# Patient Record
Sex: Female | Born: 1970 | Race: Black or African American | Hispanic: No | Smoking: Current every day smoker
Health system: Southern US, Community
[De-identification: ages and names within clinical notes are randomized; demographics above are authoritative.]

## PROBLEM LIST (undated history)

## (undated) DIAGNOSIS — F1090 Alcohol use, unspecified, uncomplicated: Secondary | ICD-10-CM

## (undated) DIAGNOSIS — IMO0001 Reserved for inherently not codable concepts without codable children: Secondary | ICD-10-CM

## (undated) DIAGNOSIS — I10 Essential (primary) hypertension: Secondary | ICD-10-CM

## (undated) DIAGNOSIS — F32A Depression, unspecified: Secondary | ICD-10-CM

## (undated) DIAGNOSIS — Z789 Other specified health status: Secondary | ICD-10-CM

## (undated) DIAGNOSIS — S62307D Unspecified fracture of fifth metacarpal bone, left hand, subsequent encounter for fracture with routine healing: Secondary | ICD-10-CM

## (undated) DIAGNOSIS — K219 Gastro-esophageal reflux disease without esophagitis: Secondary | ICD-10-CM

## (undated) DIAGNOSIS — E7800 Pure hypercholesterolemia, unspecified: Secondary | ICD-10-CM

## (undated) DIAGNOSIS — R569 Unspecified convulsions: Secondary | ICD-10-CM

## (undated) DIAGNOSIS — Z7289 Other problems related to lifestyle: Secondary | ICD-10-CM

## (undated) HISTORY — PX: ABDOMINAL HYSTERECTOMY: SHX81

## (undated) HISTORY — DX: Depression, unspecified: F32.A

## (undated) HISTORY — DX: Unspecified fracture of fifth metacarpal bone, left hand, subsequent encounter for fracture with routine healing: S62.307D

---

## 2010-04-27 ENCOUNTER — Emergency Department: Payer: Self-pay | Admitting: Emergency Medicine

## 2010-06-26 ENCOUNTER — Ambulatory Visit: Payer: Self-pay | Admitting: Unknown Physician Specialty

## 2010-07-01 ENCOUNTER — Ambulatory Visit: Payer: Self-pay | Admitting: Unknown Physician Specialty

## 2011-03-31 ENCOUNTER — Emergency Department: Payer: Self-pay | Admitting: Emergency Medicine

## 2012-04-11 ENCOUNTER — Ambulatory Visit: Payer: Self-pay

## 2012-06-01 ENCOUNTER — Emergency Department: Payer: Self-pay | Admitting: Emergency Medicine

## 2012-06-01 LAB — CBC WITH DIFFERENTIAL/PLATELET
Basophil %: 0.8 %
Eosinophil #: 0.2 10*3/uL (ref 0.0–0.7)
Eosinophil %: 2.3 %
HGB: 13.3 g/dL (ref 12.0–16.0)
Lymphocyte #: 2.3 10*3/uL (ref 1.0–3.6)
MCH: 31.4 pg (ref 26.0–34.0)
MCHC: 34 g/dL (ref 32.0–36.0)
MCV: 92 fL (ref 80–100)
Monocyte #: 0.5 x10 3/mm (ref 0.2–0.9)
Monocyte %: 7.1 %
WBC: 7.4 10*3/uL (ref 3.6–11.0)

## 2012-06-01 LAB — BASIC METABOLIC PANEL
Anion Gap: 5 — ABNORMAL LOW (ref 7–16)
Chloride: 105 mmol/L (ref 98–107)
Co2: 28 mmol/L (ref 21–32)
Creatinine: 0.9 mg/dL (ref 0.60–1.30)
Osmolality: 276 (ref 275–301)
Potassium: 3.1 mmol/L — ABNORMAL LOW (ref 3.5–5.1)
Sodium: 138 mmol/L (ref 136–145)

## 2013-12-24 ENCOUNTER — Ambulatory Visit: Payer: Self-pay | Admitting: Family Medicine

## 2014-10-22 ENCOUNTER — Observation Stay: Payer: Self-pay | Admitting: Specialist

## 2014-10-22 DIAGNOSIS — I340 Nonrheumatic mitral (valve) insufficiency: Secondary | ICD-10-CM

## 2014-10-22 LAB — COMPREHENSIVE METABOLIC PANEL WITH GFR
Albumin: 4 g/dL
Alkaline Phosphatase: 114 U/L
Anion Gap: 11
BUN: 19 mg/dL — ABNORMAL HIGH
Bilirubin,Total: 0.7 mg/dL
Calcium, Total: 8.6 mg/dL
Chloride: 102 mmol/L
Co2: 26 mmol/L
Creatinine: 0.91 mg/dL
EGFR (African American): >60
EGFR (Non-African Amer.): >60
Glucose: 88 mg/dL
Osmolality: 279
Potassium: 3.3 mmol/L — ABNORMAL LOW
SGOT(AST): 42 U/L — ABNORMAL HIGH
SGPT (ALT): 42 U/L
Sodium: 139 mmol/L
Total Protein: 8.6 g/dL — ABNORMAL HIGH

## 2014-10-22 LAB — URINALYSIS, COMPLETE
Bacteria: NONE SEEN
Bilirubin,UR: NEGATIVE
Blood: NEGATIVE
Glucose,UR: NEGATIVE mg/dL
Ketone: NEGATIVE
Leukocyte Esterase: NEGATIVE
Nitrite: NEGATIVE
Ph: 6
Protein: NEGATIVE
RBC,UR: 2 /HPF
Specific Gravity: 1.017
Squamous Epithelial: 1
WBC UR: <1 /HPF

## 2014-10-22 LAB — CBC
HCT: 43.1 %
HGB: 14 g/dL
MCH: 29.8 pg
MCHC: 32.5 g/dL
MCV: 92 fL
Platelet: 234 10*3/uL
RBC: 4.7 X10 6/mm 3
RDW: 14.6 % — ABNORMAL HIGH
WBC: 7.2 10*3/uL

## 2014-10-22 LAB — TROPONIN I
Troponin-I: <0.02 ng/mL
Troponin-I: <0.02 ng/mL

## 2014-10-23 LAB — LIPID PANEL
Cholesterol: 141 mg/dL (ref 0–200)
HDL: 42 mg/dL (ref 40–60)
Ldl Cholesterol, Calc: 40 mg/dL (ref 0–100)
Triglycerides: 295 mg/dL — ABNORMAL HIGH (ref 0–200)
VLDL Cholesterol, Calc: 59 mg/dL — ABNORMAL HIGH (ref 5–40)

## 2014-10-23 LAB — TSH: Thyroid Stimulating Horm: 3.33 u[IU]/mL

## 2014-10-23 LAB — TROPONIN I: Troponin-I: <0.02 ng/mL

## 2014-10-23 LAB — SEDIMENTATION RATE: Erythrocyte Sed Rate: 9 mm/hr (ref 0–20)

## 2014-12-18 ENCOUNTER — Ambulatory Visit: Payer: Self-pay | Admitting: Family Medicine

## 2015-01-13 ENCOUNTER — Other Ambulatory Visit: Payer: Self-pay | Admitting: *Deleted

## 2015-01-13 ENCOUNTER — Other Ambulatory Visit: Payer: Self-pay | Admitting: Oncology

## 2015-01-13 DIAGNOSIS — Z1231 Encounter for screening mammogram for malignant neoplasm of breast: Secondary | ICD-10-CM

## 2015-01-26 NOTE — H&P (Signed)
PATIENT NAME:  Carla Dickson, Carla Dickson MR#:  [id number] DATE OF BIRTH:  Jun 21, 1971  DATE OF ADMISSION:  10/22/2014  PRIMARY CARE PHYSICIAN: [facility], Lanier Ensign, MD.  CHIEF COMPLAINT: Syncope.   HISTORY OF PRESENT ILLNESS: This is a 44 year old female who presents to the hospital after having a syncopal episode this past Sunday. She has been having headaches since she had her syncopal episode and therefore came to the ER for further evaluation today. The patient denied any prodromal symptoms prior to her syncope, like chest pain, palpitations, dizziness, headache, or any other associated symptoms. The syncope was witnessed by her husband, who saw her stand up and then collapse to the floor. She was out for about 5 minutes, was breathing but not talking at that time. There was no seizure type activity. There was no incontinence. She did not come to the ER on Sunday, but continued to complain of a headache today and therefore was brought to the Emergency Room. Her CT head showed some possible abnormality in her parietal lobe consistent with a possible neoplasm. She underwent an MRI of her brain with and without contrast, which showed old multiple infarcts, but no acute infarct. Hospitalist services was contacted for further treatment and evaluation. The patient denies any numbness, tingling, any blurry vision. Admits to some nausea but no vomiting, no chest pain, no shortness of breath, no other associated symptoms presently.   REVIEW OF SYSTEMS: CONSTITUTIONAL: No documented fever. No weight gain, no weight loss.  EYES: No blurry or double vision.  EARS, NOSE, AND THROAT: No tinnitus. No postnasal drip. No redness of the oropharynx.  RESPIRATORY: No cough, no wheeze, no hemoptysis, no dyspnea.  CARDIOVASCULAR: No chest pain, no orthopnea, or palpitations. Positive syncope.  GASTROINTESTINAL: No nausea, no vomiting, no diarrhea, no abdominal pain, no melena, no hematochezia.  GENITOURINARY: No  dysuria, no hematuria.  ENDOCRINE: No polyuria, nocturia, heat or cold intolerance. HEMATOLOGIC: No anemia, no bruising, no bleeding.  INTEGUMENTARY: No rashes, no lesions.  MUSCULOSKELETAL: No arthritis, no swelling, no gout.  NEUROLOGIC: No numbness, tingling. No ataxia. No seizure-type activity.  PSYCHIATRIC: No anxiety. No insomnia. No ADD.   PAST MEDICAL HISTORY: Consistent with hypertension, seasonal allergies, ongoing tobacco abuse.   ALLERGIES: No known drug allergies.   SOCIAL HISTORY: Does smoke about a 1/2 pack per day, has been smoking for the past 40+ years. Also, drinks about a 40 ounce of beer daily, last drink was this past Sunday. Used to do crack, has not done it in 8 years. No other illicit drug abuse. Lives at home with her husband.   FAMILY HISTORY: Father is alive, has no medical history. Mother is alive, does have a history of stroke.   CURRENT MEDICATIONS: Hydrochlorothiazide 25 mg daily and Zyrtec 10 mg daily.   PHYSICAL EXAMINATION: Presently is as follows:  VITAL SIGNS: Noted to be: Temperature is 97.1, pulse 89, respirations 18, blood pressure 155/91, saturation is 100% on room air.  GENERAL: She is a pleasant-appearing female in no apparent distress.  HEENT: Atraumatic, normocephalic. Extraocular muscles are intact. Pupils equal and reactive on to light. Sclerae anicteric. No conjunctival injection. No pharyngeal erythema.  NECK: Supple. There is no jugular venous distention. No bruits, no lymphadenopathy, no thyromegaly.  HEART: Regular rate and rhythm. No murmurs, no rubs, no clicks.  LUNGS: Clear to auscultation bilaterally. No rales, no rhonchi, no wheezes.  ABDOMEN: Soft, flat, nontender, nondistended. Has good bowel sounds. No hepatosplenomegaly appreciated.  EXTREMITIES: No evidence of any  cyanosis, clubbing, or peripheral edema. Has +2 pedal and radial pulses bilaterally.  NEUROLOGIC: The patient is alert, awake, oriented x 3 with no focal motor or  sensory deficits appreciated bilaterally.  SKIN: Moist and warm with no rashes appreciated.  LYMPHATIC: There is no cervical or axillary lymphadenopathy.   LABORATORY DATA: Showed a serum glucose of 88. BUN 19, creatinine 0.9, sodium 139, potassium 3.3, chloride 102, bicarbonate 26. LFTs are within normal limits. Troponin less than 0.02. White cell count 7.2, hemoglobin 14.1, hematocrit 43.1, platelet count of 234,000. Urinalysis within normal limits.   The patient did have a CT of the head done without contrast which showed mild diffuse cortical atrophy, right parietal subcortical white matter low density is noted concerning for infarction or possible edema related to neoplasm. An MRI of the brain done with and without contrast showing no acute infarct, chronic infarct in the parietal lobe bilaterally, right greater than the left, additionally areas of chronic ischemia as above.   ASSESSMENT AND PLAN: This is a 44 year old female with history of hypertension, ongoing tobacco abuse, who presents to the hospital due to a headache after having a witnessed syncopal episode on Sunday.  1.  Syncope with collapse. The exact etiology of the syncope is unclear. The patient's MRI of the brain does show possible old infarcts, but I do not think that that is the cause of her syncope. I will observe her on telemetry, check a 2-dimensional echocardiogram, get a carotid duplex, check orthostatic vital signs. The patient likely may benefit from an event monitor as an outpatient.  2.  Multiple old infarcts on the MRI. We will get a neurology consult. Continue some aspirin and check a lipid profile. The patient has no previous history of cerebrovascular accident. We will get a carotid duplex and echocardiogram as mentioned.  3.  Uncontrolled hypertension. We will continue hydrochlorothiazide. I will add some clonidine, also some p.r.n. hydralazine, follow hemodynamics.  4.  Seasonal allergies. Continue with her Zyrtec.    CODE STATUS: The patient is a full code.   TIME SPENT: Forty-five minutes.    ____________________________ Rolly PancakeVivek J. Cherlynn KaiserSainani, MD vjs:TT D: 10/22/2014 18:26:58 ET T: 10/22/2014 18:45:46 ET JOB#: [id number]  cc: Rolly PancakeVivek J. Cherlynn KaiserSainani, MD, <Dictator> Houston SirenVIVEK J SAINANI MD ELECTRONICALLY SIGNED 11/09/2014 12:47

## 2015-01-26 NOTE — Discharge Summary (Signed)
PATIENT NAME:  Carla BurlyMOSES, Carla Dickson MR#:  [id number] DATE OF BIRTH:  08/02/1971  DATE OF ADMISSION:  10/22/2014 DATE OF DISCHARGE:  10/23/2014  ADMITTING DIAGNOSIS: Syncope.   DISCHARGE DIAGNOSES:  1.  Syncope of unclear etiology.  2.  Headache and neck ache after a fall, minimal changes on CT scan of the neck, suspected chronic or congenital, but nevertheless neurosurgical evaluation as an outpatient was recommended per neurologist.  3.  Hyperlipidemia.  4.  Hypertriglyceridemia.  5.  Chronic stroke.  6.  Uncontrolled essential hypertension.  7.  Anemia.  8.  Gastroesophageal reflux disease.  9.  Migraine headaches.  10.  Tobacco abuse.  DISCHARGE INSTRUCTIONS: The patient was advised to use soft cervical collar until she is seen by neurosurgery.   DISCHARGE MEDICATIONS: The patient's medications are as follows:  1.  Zyrtec 10 mg p.o. daily.  2.  Hydrochlorothiazide 25 mg p.o. daily.  3.  Clonidine 0.1 mg twice daily.  4.  Tylenol/ hydrocodone 325 mg/5 mg 1 tablet every 4 hours as needed.  5.  Aspirin 81 mg daily.  6.  Omega-3 polyunsaturated fatty acids 2 grams once daily.  7.  Lipitor 10 mg p.o. at bedtime.   HOME OXYGEN: None.   DIET: Two gram salt, low fat, low cholesterol, regular consistency.   ACTIVITY LIMITATIONS: As tolerated.   FOLLOWUP: Appointment with Dr. Carlynn PurlSowles in 2 days after discharge; [facility] in 2 days after discharge or first available.   CONSULTANTS: Care management, social work, Pauletta BrownsYuriy Zeylikman, MD.  RADIOLOGIC STUDIES: CT scan of head without contrast on 10/22/2014 revealed mild diffuse cortical atrophy, right parietal subcortical white matter low density is noted concerning for infarction or possibly edema related to neoplasm, further evaluations with MRI was recommended.   MRI of brain with and without contrast on 10/22/2014 was negative for acute infarct, chronic infarcts in parietal lobe bilaterally, right greater than left, were  noted. Additional areas of chronic ischemia were also noted, described.   CT scan of cervical spine without contrast on 10/23/2014 os odontoideum, which is not acute, but which may nonetheless be somewhat unstable. Neurosurgical or neurological consultation was suggested to determine if symptoms may relate to this chronic or congenital finding. These results were called to ordering physician or representative, according to radiologist.   Carotid ultrasound 10/23/2014 showed a very minimal amount of bilateral intimal wall thickening, not resulting in hemodynamically significant stenosis within either internal carotid artery.   Echocardiogram 10/22/2014 showed left ventricular ejection fraction by visual estimation 60% to 65%, normal global left ventricular systolic function, impaired relaxation pattern of left ventricular diastolic filling, normal right ventricular size and systolic function, mild mitral valve regurgitation, mild aortic regurgitation, mild tricuspid regurgitation, normal right ventricular systolic pressure.  HOSPITAL COURSE: The patient is a 44 year old female with past medical history significant for history of stroke, who presents to the hospital after a syncopal episode. Please refer to Dr. Hilbert OdorSainani's admission note on 10/22/2014. Apparently, the patient was having headaches and had syncopal episode and that is when she decided to come to the Emergency Room for further evaluation. According to admitting physician, syncope was witnessed by her husband, and according to admitting physician she was out for approximately 5 minutes, was breathing but was not talking and there was no seizures type activity. She did not come to the Emergency Room the day when she had syncope, however, continued to complain of headache and that is why she was brought to Emergency Room for further  evaluation. In the Emergency Room, her vital signs: Temperature was 97.1, pulse was 89, respiratory rate was 18, blood  pressure 155/91, saturation was 100% on room air. Physical examination was unremarkable. The patient's laboratory data done on arrival to the Emergency Room showed mild elevation of BUN to 19, potassium of 3.3, otherwise BMP was unremarkable. Liver enzymes revealed total protein level of 8.6, elevated AST to 42, otherwise unremarkable. Troponins x 3 were within normal limits. TSH was normal at 3.33. The patient's CBC was within normal limits with white blood cell count 7.2, hemoglobin 14.0, platelet count 234,000. Erythrocyte sedimentation rate was 9. Urinalysis was normal. EKG revealed normal sinus rhythm at 98 beats per minute, bi-atrial enlargement, left axis deviation, and no acute ST-T changes. The patient was admitted to the hospital for further evaluation. She was seen by neurologist, Dr. Loretha Brasil on 10/23/2014 who felt that the patient has been having at least 3 times a week headaches which were described as migraine headaches, but based on her explanation Dr. Loretha Brasil was not convinced that this was true migraine, but possibly tension headaches. He recommended to start the patient on magnesium 400 mg over the counter and vitamin B complex over the counter for headache prevention. He also encouraged her to follow up with cardiology and get possibly a Holter monitor to look for any possible history of cardiac arrhythmia. He also spent significant time discussing alcohol use as well as smoking cessation. He recommended no further imaging from neurological standpoint and recommended the patient to be safely discharged home. The patient had laboratory studies done including cholesterol, lipid panel which revealed LDL of 40, total cholesterol level of 141, triglycerides were markedly elevated at 295, and HDL was 92. The patient was advised to start fish oil as outpatient to decrease her hyperlipidemia and hypertriglyceridemia. Because of neck pains, she had CT scan of her neck done, which was concerning for  what radiologist described as os odontoideum. According to the neurologist however, it could be a chronic or congenital; however, neurosurgical consultation was recommended. The patient was placed in a soft collar and recommended to follow up with [facility] Neurosurgery as soon as possible. The patient is being discharged in stable condition with the above-mentioned medications and followup. On the day of discharge, temperature was 98, pulse was 88, respirations were 20, blood pressure 103/68, saturation was 99% to 100% on room air at rest. Of note, the patient did have orthostatic vital signs checked which were normal.    ____________________________ Katharina Caper, MD rv:TT D: 10/27/2014 12:51:00 ET T: 10/27/2014 19:39:12 ET JOB#: [id number]  cc: Onnie Boer. Carlynn Purl, MD Neurosurgery at [facility] Katharina Caper, MD, <Dictator>  Dariella Gillihan MD ELECTRONICALLY SIGNED 11/03/2014 16:12

## 2015-01-26 NOTE — Consult Note (Signed)
PATIENT NAME:  Carla Dickson, Carla Dickson MR#:  [id number] DATE OF BIRTH:  12/01/1970  DATE OF CONSULTATION:  10/23/2014  REFERRING PHYSICIAN:   CONSULTING PHYSICIAN:  Pauletta BrownsYuriy Malavika Lira, MD  REASON FOR CONSULTATION: Syncope, headache, and strokes on imaging.   HISTORY OF PRESENT ILLNESS: This is a 44 year old female with past medical history of chronic migraines, as per the patient, 3 times a week in the right occipital and right temporal lobe, status post syncopal episode over the weekend. No seizure-like activity. No tongue biting, no urinary incontinence. Presents to the Emergency Department 2 days later due to continuing headache. As stated above, the patient does have history of headaches about 3 times a week. She says they are migraines. They are described as pressure-like in the right occipital lobe radiating into her right temporal lobe, relieved with over-the-counter medications and relieved by rest. No phonophobia. No photophobia. Currently her headache has improved and the patient is close to baseline, asking to be discharged home.   REVIEW OF SYSTEMS: No fever. No chills. No shortness of breath. No abdominal pain. No chest pain. No heat or cold intolerance. No weakness on one side of the body compared to the other. Positive for headache. No anxiety. No depression.   PAST MEDICAL HISTORY: Hypertension, seasonal allergies, tobacco use.   ALLERGIES: No known drug allergies.   SOCIAL HISTORY: Smokes 1/2 pack per day for the past 40 years, daily EtOH drinker.   HOME MEDICATIONS: Have been reviewed.   VITAL SIGNS: Reviewed.   LABORATORY WORK-UP: Reviewed.   IMAGING: Reviewed, as well. MRI of the brain, the patient has bilateral parietal lobe infarcts that are chronic in nature.   NEUROLOGIC EVALUATION: The patient was able tell me her name, date and the reason why she is in the hospital. Facial sensation is intact. Facial motor is intact. Tongue is midline. Uvula elevates symmetrically.  Shoulder shrug intact. Motor strength 5/5 bilaterally in upper and lower extremities. Sensation intact to light touch and temperature. Coordination: Finger-to-nose intact. Reflexes present throughout. Gait not assessed.   IMPRESSION: A 44 year old female with history of hypertension, EtOH use, smoking, presented with a syncopal-like episode 2 days ago, no seizure-like activity, came in due to headache. The patient states she has a headache 3 times a week, describes as migraine, but based on her explanation, I am not convinced this is a true migraine, possibly tension headaches.   PLAN: Magnesium 500 mg over-the-counter and vitamin B complex over-the-counter daily for headache prevention. The patient is on antiplatelet and I told her that she has to continue it daily because of the remote bilateral parietal lobe infarcts. I would encourage the patient to follow with cardiology for possibility of a Holter monitor to look for any possible history of cardiac arrhythmia.   Significant time spent discussing EtOH use and smoking cessation.   No further imaging from a neurological standpoint. I feel safe for the patient to be discharged today.   Thank you. It was a pleasure seeing this patient. Please call with any questions.    ____________________________ Pauletta BrownsYuriy Siddharth Babington, MD yz:JT D: 10/23/2014 12:29:30 ET T: 10/23/2014 13:02:15 ET JOB#: [id number]  cc: Pauletta BrownsYuriy Normon Pettijohn, MD, <Dictator> Pauletta BrownsYURIY Elodie Panameno MD ELECTRONICALLY SIGNED 11/05/2014 12:04

## 2015-02-14 ENCOUNTER — Other Ambulatory Visit: Payer: Self-pay | Admitting: Oncology

## 2015-02-14 DIAGNOSIS — Z1231 Encounter for screening mammogram for malignant neoplasm of breast: Secondary | ICD-10-CM

## 2015-02-17 ENCOUNTER — Ambulatory Visit: Payer: Self-pay

## 2015-02-17 ENCOUNTER — Inpatient Hospital Stay: Admission: RE | Admit: 2015-02-17 | Payer: Self-pay | Source: Ambulatory Visit

## 2015-02-26 ENCOUNTER — Encounter: Payer: Self-pay | Admitting: Urgent Care

## 2015-02-26 ENCOUNTER — Emergency Department
Admission: EM | Admit: 2015-02-26 | Discharge: 2015-02-26 | Disposition: A | Discharge status: Home / Self Care | Payer: 59 | Attending: Emergency Medicine | Admitting: Emergency Medicine

## 2015-02-26 ENCOUNTER — Emergency Department: Payer: 59

## 2015-02-26 DIAGNOSIS — K852 Alcohol induced acute pancreatitis without necrosis or infection: Secondary | ICD-10-CM

## 2015-02-26 DIAGNOSIS — Z720 Tobacco use: Secondary | ICD-10-CM | POA: Insufficient documentation

## 2015-02-26 DIAGNOSIS — I10 Essential (primary) hypertension: Secondary | ICD-10-CM | POA: Insufficient documentation

## 2015-02-26 DIAGNOSIS — K2970 Gastritis, unspecified, without bleeding: Secondary | ICD-10-CM | POA: Diagnosis not present

## 2015-02-26 DIAGNOSIS — R1013 Epigastric pain: Secondary | ICD-10-CM | POA: Diagnosis present

## 2015-02-26 HISTORY — DX: Pure hypercholesterolemia, unspecified: E78.00

## 2015-02-26 HISTORY — DX: Essential (primary) hypertension: I10

## 2015-02-26 HISTORY — DX: Gastro-esophageal reflux disease without esophagitis: K21.9

## 2015-02-26 HISTORY — DX: Reserved for inherently not codable concepts without codable children: IMO0001

## 2015-02-26 LAB — CBC
HCT: 35 % (ref 35.0–47.0)
Hemoglobin: 12.1 g/dL (ref 12.0–16.0)
MCH: 31.6 pg (ref 26.0–34.0)
MCHC: 34.5 g/dL (ref 32.0–36.0)
MCV: 91.5 fL (ref 80.0–100.0)
PLATELETS: 160 10*3/uL (ref 150–440)
RBC: 3.83 MIL/uL (ref 3.80–5.20)
RDW: 15.7 % — ABNORMAL HIGH (ref 11.5–14.5)
WBC: 7.7 10*3/uL (ref 3.6–11.0)

## 2015-02-26 LAB — BASIC METABOLIC PANEL
BUN: 14 mg/dL (ref 6–20)
CALCIUM: 8.8 mg/dL — AB (ref 8.9–10.3)
CHLORIDE: 109 mmol/L (ref 101–111)
CO2: 25 mmol/L (ref 22–32)
Creatinine, Ser: 0.85 mg/dL (ref 0.44–1.00)
GFR calc Af Amer: >60 mL/min (ref >60)
GFR calc non Af Amer: >60 mL/min (ref >60)
Glucose, Bld: 110 mg/dL — ABNORMAL HIGH (ref 65–99)
POTASSIUM: 3 mmol/L — AB (ref 3.5–5.1)

## 2015-02-26 LAB — TROPONIN I
Troponin I: <0.03 ng/mL (ref <0.031)
Troponin I: <0.03 ng/mL (ref <0.031)

## 2015-02-26 LAB — LIPASE, BLOOD: Lipase: 53 U/L — ABNORMAL HIGH (ref 22–51)

## 2015-02-26 NOTE — ED Provider Notes (Signed)
[facility] Emergency Department Provider Note  ____________________________________________  Time seen: 5:55 AM  I have reviewed the triage vital signs and the nursing notes.   HISTORY  Chief Complaint Chest Pain      HPI Carla Dickson is a 44 y.o. female presents with epigastric pain nausea with onset last night. Patient states that pain is episodic lasting anywhere from 5-10 minutes. Of note patient called EMS last night however on their arrival she sent him away and she is to go to the emergency department. When I asked the patient asked why she refused at that time she admitted to drinking a large amount of alcohol prior to EMS arrival.It is described as burning and nonradiating. Patient denies any pain at present     Past Medical History  Diagnosis Date  . Hypertension   . Hypercholesteremia   . Reflux     There are no active problems to display for this patient.   Past Surgical History  Procedure Laterality Date  . Abdominal hysterectomy      No current outpatient prescriptions on file.  Allergies Review of patient's allergies indicates no known allergies.  No family history on file.  Social History History  Substance Use Topics  . Smoking status: Current Every Day Smoker  . Smokeless tobacco: Not on file  . Alcohol Use: Yes    Review of Systems  Constitutional: Negative for fever. Eyes: Negative for visual changes. ENT: Negative for sore throat Cardiovascular: Negative for chest pain. Respiratory: Negative for shortness of breath. Gastrointestinal: Positive for abdominal pain, vomiting and diarrhea. Genitourinary: Negative for dysuria. Musculoskeletal: Negative for back pain. Skin: Negative for rash. Neurological: Negative for headaches or focal weakness   10-point ROS otherwise negative.  ____________________________________________   PHYSICAL EXAM:  VITAL SIGNS: ED Triage Vitals  Enc Vitals Group   BP 02/26/15 0103 173/105 mmHg     Pulse Rate 02/26/15 0103 112     Resp 02/26/15 0103 18     Temp 02/26/15 0103 98.9 F (37.2 C)     Temp Source 02/26/15 0103 Oral     SpO2 02/26/15 0103 96 %     Weight 02/26/15 0103 165 lb (74.844 kg)     Height 02/26/15 0103 5\' 4"  (1.626 m)     Head Cir --      Peak Flow --      Pain Score 02/26/15 0104 9     Pain Loc --      Pain Edu? --      Excl. in GC? --      Constitutional: Alert and oriented. Well appearing and in no distress. Eyes: Conjunctivae are normal. PERRL. ENT   Head: Normocephalic and atraumatic.   Nose: No rhinnorhea.   Mouth/Throat: Mucous membranes are moist. Cardiovascular: Normal rate, regular rhythm. Normal and symmetric distal pulses are present in all extremities. No murmurs, rubs, or gallops. Respiratory: Normal respiratory effort without tachypnea nor retractions. Breath sounds are clear and equal bilaterally.  Gastrointestinal: Soft and non-tender in all quadrants. No distention. There is no CVA tenderness. Genitourinary: deferred Musculoskeletal: Nontender with normal range of motion in all extremities. No lower extremity tenderness nor edema. Neurologic:  Normal speech and language. No gross focal neurologic deficits are appreciated. Skin:  Skin is warm, dry and intact. No rash noted. Psychiatric: Mood and affect are normal. Patient exhibits appropriate insight and judgment.  ____________________________________________    LABS (pertinent positives/negatives)  Labs Reviewed  CBC - Abnormal; Notable for the  following:    RDW 15.7 (*)    All other components within normal limits  BASIC METABOLIC PANEL - Abnormal; Notable for the following:    Potassium 3.0 (*)    Glucose, Bld 110 (*)    Calcium 8.8 (*)    All other components within normal limits  TROPONIN I  TROPONIN I  LIPASE, BLOOD      Date: 02/26/2015  Rate: 109  Rhythm: Sinus tachycardia  QRS Axis: normal  Intervals: normal  ST/T  Wave abnormalities: normal  Conduction Disutrbances: none  Narrative Interpretation: unremarkable         INITIAL IMPRESSION / ASSESSMENT AND PLAN / ED COURSE  Pertinent labs & imaging results that were available during my care of the patient were reviewed by me and considered in my medical decision making (see chart for details).  Given history of physical exam we'll obtain lipase and repeat troponin. ----------------------------------------- 7:10 AM on 02/26/2015 -----------------------------------------  Cardiac enzymes negative 2 elevated lipase. History of physical exam concerning for gastritis/pancreatitis.  ____________________________________________   FINAL CLINICAL IMPRESSION(S) / ED DIAGNOSES  Final diagnoses:  Gastritis  Pancreatitis, alcoholic, acute     Darci Current, MD 02/26/15 [phone]

## 2015-02-26 NOTE — ED Notes (Signed)
Pt uprite on stretcher in exam room with no distress noted; reports since yesterday having left sided CP (pressure) accomp by SOB and nausea

## 2015-02-26 NOTE — Discharge Instructions (Signed)
Acute Pancreatitis °Acute pancreatitis is a disease in which the pancreas becomes suddenly inflamed. The pancreas is a large gland located behind your stomach. The pancreas produces enzymes that help digest food. The pancreas also releases the hormones glucagon and insulin that help regulate blood sugar. Damage to the pancreas occurs when the digestive enzymes from the pancreas are activated and begin attacking the pancreas before being released into the intestine. Most acute attacks last a couple of days and can cause serious complications. Some people become dehydrated and develop low blood pressure. In severe cases, bleeding into the pancreas can lead to shock and can be life-threatening. The lungs, heart, and kidneys may fail. °CAUSES  °Pancreatitis can happen to anyone. In some cases, the cause is unknown. Most cases are caused by: °· Alcohol abuse. °· Gallstones. °Other less common causes are: °· Certain medicines. °· Exposure to certain chemicals. °· Infection. °· Damage caused by an accident (trauma). °· Abdominal surgery. °SYMPTOMS  °· Pain in the upper abdomen that may radiate to the back. °· Tenderness and swelling of the abdomen. °· Nausea and vomiting. °DIAGNOSIS  °Your caregiver will perform a physical exam. Blood and stool tests may be done to confirm the diagnosis. Imaging tests may also be done, such as X-rays, CT scans, or an ultrasound of the abdomen. °TREATMENT  °Treatment usually requires a stay in the hospital. Treatment may include: °· Pain medicine. °· Fluid replacement through an intravenous line (IV). °· Placing a tube in the stomach to remove stomach contents and control vomiting. °· Not eating for 3 or 4 days. This gives your pancreas a rest, because enzymes are not being produced that can cause further damage. °· Antibiotic medicines if your condition is caused by an infection. °· Surgery of the pancreas or gallbladder. °HOME CARE INSTRUCTIONS  °· Follow the diet advised by your  caregiver. This may involve avoiding alcohol and decreasing the amount of fat in your diet. °· Eat smaller, more frequent meals. This reduces the amount of digestive juices the pancreas produces. °· Drink enough fluids to keep your urine clear or pale yellow. °· Only take over-the-counter or prescription medicines as directed by your caregiver. °· Avoid drinking alcohol if it caused your condition. °· Do not smoke. °· Get plenty of rest. °· Check your blood sugar at home as directed by your caregiver. °· Keep all follow-up appointments as directed by your caregiver. °SEEK MEDICAL CARE IF:  °· You do not recover as quickly as expected. °· You develop new or worsening symptoms. °· You have persistent pain, weakness, or nausea. °· You recover and then have another episode of pain. °SEEK IMMEDIATE MEDICAL CARE IF:  °· You are unable to eat or keep fluids down. °· Your pain becomes severe. °· You have a fever or persistent symptoms for more than 2 to 3 days. °· You have a fever and your symptoms suddenly get worse. °· Your skin or the white part of your eyes turn yellow (jaundice). °· You develop vomiting. °· You feel dizzy, or you faint. °· Your blood sugar is high (over 300 mg/dL). °MAKE SURE YOU:  °· Understand these instructions. °· Will watch your condition. °· Will get help right away if you are not doing well or get worse. °Document Released: 09/13/2005 Document Revised: 03/14/2012 Document Reviewed: 12/23/2011 °[employer]® Patient Information ©2015 [employer]. This information is not intended to replace advice given to you by your health care provider. Make sure you discuss any questions you have   with your health care provider. ° °Gastritis, Adult °Gastritis is soreness and swelling (inflammation) of the lining of the stomach. Gastritis can develop as a sudden onset (acute) or long-term (chronic) condition. If gastritis is not treated, it can lead to stomach bleeding and ulcers. °CAUSES  °Gastritis occurs when  the stomach lining is weak or damaged. Digestive juices from the stomach then inflame the weakened stomach lining. The stomach lining may be weak or damaged due to viral or bacterial infections. One common bacterial infection is the Helicobacter pylori infection. Gastritis can also result from excessive alcohol consumption, taking certain medicines, or having too much acid in the stomach.  °SYMPTOMS  °In some cases, there are no symptoms. When symptoms are present, they may include: °· Pain or a burning sensation in the upper abdomen. °· Nausea. °· Vomiting. °· An uncomfortable feeling of fullness after eating. °DIAGNOSIS  °Your caregiver may suspect you have gastritis based on your symptoms and a physical exam. To determine the cause of your gastritis, your caregiver may perform the following: °· Blood or stool tests to check for the H pylori bacterium. °· Gastroscopy. A thin, flexible tube (endoscope) is passed down the esophagus and into the stomach. The endoscope has a light and camera on the end. Your caregiver uses the endoscope to view the inside of the stomach. °· Taking a tissue sample (biopsy) from the stomach to examine under a microscope. °TREATMENT  °Depending on the cause of your gastritis, medicines may be prescribed. If you have a bacterial infection, such as an H pylori infection, antibiotics may be given. If your gastritis is caused by too much acid in the stomach, H2 blockers or antacids may be given. Your caregiver may recommend that you stop taking aspirin, ibuprofen, or other nonsteroidal anti-inflammatory drugs (NSAIDs). °HOME CARE INSTRUCTIONS °· Only take over-the-counter or prescription medicines as directed by your caregiver. °· If you were given antibiotic medicines, take them as directed. Finish them even if you start to feel better. °· Drink enough fluids to keep your urine clear or pale yellow. °· Avoid foods and drinks that make your symptoms worse, such as: °¨ Caffeine or alcoholic  drinks. °¨ Chocolate. °¨ Peppermint or mint flavorings. °¨ Garlic and onions. °¨ Spicy foods. °¨ Citrus fruits, such as oranges, lemons, or limes. °¨ Tomato-based foods such as sauce, chili, salsa, and pizza. °¨ Fried and fatty foods. °· Eat small, frequent meals instead of large meals. °SEEK IMMEDIATE MEDICAL CARE IF:  °· You have black or dark red stools. °· You vomit blood or material that looks like coffee grounds. °· You are unable to keep fluids down. °· Your abdominal pain gets worse. °· You have a fever. °· You do not feel better after 1 week. °· You have any other questions or concerns. °MAKE SURE YOU: °· Understand these instructions. °· Will watch your condition. °· Will get help right away if you are not doing well or get worse. °Document Released: 09/07/2001 Document Revised: 03/14/2012 Document Reviewed: 10/27/2011 °ExitCare® Patient Information ©2015 [employer]. This information is not intended to replace advice given to you by your health care provider. Make sure you discuss any questions you have with your health care provider. ° °

## 2015-02-26 NOTE — ED Notes (Signed)
Patient presents with c/o LEFT CP with (+) dizziness, nausea, and SOB. Patient called out EMS earlier, however she sent them away and refused to come.

## 2015-05-19 ENCOUNTER — Ambulatory Visit: Payer: 59 | Attending: Oncology

## 2015-12-17 ENCOUNTER — Emergency Department: Payer: 59

## 2015-12-17 ENCOUNTER — Emergency Department
Admission: EM | Admit: 2015-12-17 | Discharge: 2015-12-17 | Disposition: A | Discharge status: Home / Self Care | Payer: 59 | Attending: Emergency Medicine | Admitting: Emergency Medicine

## 2015-12-17 ENCOUNTER — Encounter: Payer: Self-pay | Admitting: Emergency Medicine

## 2015-12-17 DIAGNOSIS — E7800 Pure hypercholesterolemia, unspecified: Secondary | ICD-10-CM | POA: Insufficient documentation

## 2015-12-17 DIAGNOSIS — I10 Essential (primary) hypertension: Secondary | ICD-10-CM | POA: Diagnosis not present

## 2015-12-17 DIAGNOSIS — W010XXA Fall on same level from slipping, tripping and stumbling without subsequent striking against object, initial encounter: Secondary | ICD-10-CM | POA: Insufficient documentation

## 2015-12-17 DIAGNOSIS — F17210 Nicotine dependence, cigarettes, uncomplicated: Secondary | ICD-10-CM | POA: Insufficient documentation

## 2015-12-17 DIAGNOSIS — Y999 Unspecified external cause status: Secondary | ICD-10-CM | POA: Insufficient documentation

## 2015-12-17 DIAGNOSIS — M79604 Pain in right leg: Secondary | ICD-10-CM | POA: Diagnosis present

## 2015-12-17 DIAGNOSIS — Y929 Unspecified place or not applicable: Secondary | ICD-10-CM | POA: Diagnosis not present

## 2015-12-17 DIAGNOSIS — S82401A Unspecified fracture of shaft of right fibula, initial encounter for closed fracture: Secondary | ICD-10-CM

## 2015-12-17 DIAGNOSIS — Y939 Activity, unspecified: Secondary | ICD-10-CM | POA: Insufficient documentation

## 2015-12-17 MED ORDER — OXYCODONE-ACETAMINOPHEN 7.5-325 MG PO TABS
1.0000 | ORAL_TABLET | ORAL | Status: AC | PRN
Start: 2015-12-17 — End: 2016-12-16

## 2015-12-17 MED ORDER — OXYCODONE-ACETAMINOPHEN 5-325 MG PO TABS
ORAL_TABLET | ORAL | Status: AC
  Filled 2015-12-17: qty 1

## 2015-12-17 MED ORDER — OXYCODONE-ACETAMINOPHEN 5-325 MG PO TABS
1.0000 | ORAL_TABLET | Freq: Once | ORAL | Status: AC
  Administered 2015-12-17: 1 via ORAL

## 2015-12-17 NOTE — Discharge Instructions (Signed)
Wear splint and ambulate with crutches until evaluation by orthopedic Dr. °

## 2015-12-17 NOTE — ED Provider Notes (Signed)
[facility] Emergency Department Provider Note  ____________________________________________  Time seen: Approximately 8:32 AM  I have reviewed the triage vital signs and the nursing notes.   HISTORY  Chief Complaint Leg Pain    HPI Carla Dickson is a 45 y.o. female patient complaining of right leg pain starting below the knee and radiated down to the right medial ankle. Patient states she tripped over the weekend and the pain is worsened. Patient state increased pain with weightbearing and ambulation.Patient rates the pain as 8/10. Patient described the pain as "sharp". Patient state no relief with over-the-counter anti-inflammatory medications.   Past Medical History  Diagnosis Date  . Hypertension   . Hypercholesteremia   . Reflux     There are no active problems to display for this patient.   Past Surgical History  Procedure Laterality Date  . Abdominal hysterectomy      Current Outpatient Rx  Name  Route  Sig  Dispense  Refill  . oxyCODONE-acetaminophen (PERCOCET) 7.5-325 MG tablet   Oral   Take 1 tablet by mouth every 4 (four) hours as needed for severe pain.   20 tablet   0     Allergies Review of patient's allergies indicates no known allergies.  No family history on file.  Social History Social History  Substance Use Topics  . Smoking status: Current Every Day Smoker -- 0.50 packs/day    Types: Cigarettes  . Smokeless tobacco: None  . Alcohol Use: Yes    Review of Systems Constitutional: No fever/chills Eyes: No visual changes. ENT: No sore throat. Cardiovascular: Denies chest pain. Respiratory: Denies shortness of breath. Gastrointestinal: No abdominal pain.  No nausea, no vomiting.  No diarrhea.  No constipation. Genitourinary: Negative for dysuria. Musculoskeletal: Right lower leg pain  Skin: Negative for rash. Neurological: Negative for headaches, focal weakness or numbness. Endocrine:Hypertension and  hyperlipidemia. ____________________________________________   PHYSICAL EXAM:  VITAL SIGNS: ED Triage Vitals  Enc Vitals Group     BP 12/17/15 0808 124/86 mmHg     Pulse Rate 12/17/15 0808 100     Resp 12/17/15 0808 18     Temp 12/17/15 0808 97.7 F (36.5 C)     Temp Source 12/17/15 0808 Oral     SpO2 12/17/15 0808 100 %     Weight 12/17/15 0808 168 lb (76.204 kg)     Height 12/17/15 0808 5\' 4"  (1.626 m)     Head Cir --      Peak Flow --      Pain Score 12/17/15 0808 8     Pain Loc --      Pain Edu? --      Excl. in GC? --     Constitutional: Alert and oriented. Well appearing and in no acute distress. Eyes: Conjunctivae are normal. PERRL. EOMI. Head: Atraumatic. Nose: No congestion/rhinnorhea. Mouth/Throat: Mucous membranes are moist.  Oropharynx non-erythematous. Neck: No stridor.  No cervical spine tenderness to palpation. Hematological/Lymphatic/Immunilogical: No cervical lymphadenopathy. Cardiovascular: Normal rate, regular rhythm. Grossly normal heart sounds.  Good peripheral circulation. Respiratory: Normal respiratory effort.  No retractions. Lungs CTAB. Gastrointestinal: Soft and nontender. No distention. No abdominal bruits. No CVA tenderness. Musculoskeletal: No obvious deformity or edema. Patient is moderate guarding palpation of the mid fibular and medial ankle. Neurologic:  Normal speech and language. No gross focal neurologic deficits are appreciated. No gait instability. Skin:  Skin is warm, dry and intact. No rash noted. Psychiatric: Mood and affect are normal. Speech and behavior  are normal.  ____________________________________________   LABS (all labs ordered are listed, but only abnormal results are displayed)  Labs Reviewed - No data to display ____________________________________________  EKG   ____________________________________________  RADIOLOGY  Fracture right proximal fibular.  I, Joni Reining, personally viewed and evaluated  these images (plain radiographs) as part of my medical decision making, as well as reviewing the written report by the radiologist. ____________________________________________   PROCEDURES  Procedure(s) performed: None  Critical Care performed: No  ____________________________________________   INITIAL IMPRESSION / ASSESSMENT AND PLAN / ED COURSE  Pertinent labs & imaging results that were available during my care of the patient were reviewed by me and considered in my medical decision making (see chart for details).  Right proximal femoral fracture. Discussed x-ray finding with patient. Patient placed in a knee immobilizer and given crutches for nonweightbearing. He advised contacting orthopedics today to schedule follow-up appointment. Patient given a work note. Patient given a prescription for Percocets. ____________________________________________   FINAL CLINICAL IMPRESSION(S) / ED DIAGNOSES  Final diagnoses:  Right fibular fracture, closed, initial encounter      Joni Reining, PA-C 12/17/15 4098  Emily Filbert, MD 12/17/15 [phone]

## 2015-12-17 NOTE — ED Notes (Signed)
Pt states she tripped over her cat on Sunday, hurt her right leg, pain continues from right knee to ankle, no deformities or swelling noted.

## 2015-12-17 NOTE — ED Notes (Signed)
Pt reports right leg pain since Sunday with radiation down to right ankle. Tripped over cat over the weekend and leg is getting worse.

## 2016-07-28 ENCOUNTER — Emergency Department
Admission: EM | Admit: 2016-07-28 | Discharge: 2016-07-28 | Disposition: A | Discharge status: Home / Self Care | Payer: BLUE CROSS/BLUE SHIELD | Attending: Emergency Medicine | Admitting: Emergency Medicine

## 2016-07-28 ENCOUNTER — Emergency Department: Payer: BLUE CROSS/BLUE SHIELD

## 2016-07-28 DIAGNOSIS — Y939 Activity, unspecified: Secondary | ICD-10-CM | POA: Diagnosis not present

## 2016-07-28 DIAGNOSIS — Y929 Unspecified place or not applicable: Secondary | ICD-10-CM | POA: Insufficient documentation

## 2016-07-28 DIAGNOSIS — Y999 Unspecified external cause status: Secondary | ICD-10-CM | POA: Diagnosis not present

## 2016-07-28 DIAGNOSIS — S62346A Nondisplaced fracture of base of fifth metacarpal bone, right hand, initial encounter for closed fracture: Secondary | ICD-10-CM | POA: Diagnosis not present

## 2016-07-28 DIAGNOSIS — W010XXA Fall on same level from slipping, tripping and stumbling without subsequent striking against object, initial encounter: Secondary | ICD-10-CM | POA: Diagnosis not present

## 2016-07-28 DIAGNOSIS — F17210 Nicotine dependence, cigarettes, uncomplicated: Secondary | ICD-10-CM | POA: Insufficient documentation

## 2016-07-28 DIAGNOSIS — S6991XA Unspecified injury of right wrist, hand and finger(s), initial encounter: Secondary | ICD-10-CM | POA: Diagnosis present

## 2016-07-28 DIAGNOSIS — I10 Essential (primary) hypertension: Secondary | ICD-10-CM | POA: Insufficient documentation

## 2016-07-28 MED ORDER — HYDROCODONE-ACETAMINOPHEN 5-325 MG PO TABS: 1.0000 | ORAL_TABLET | ORAL | 0 refills | Status: DC | PRN

## 2016-07-28 NOTE — ED Triage Notes (Signed)
Pt c/o right hand pain since falling Saturday,. Denies any other injury

## 2016-07-28 NOTE — ED Notes (Signed)
Pt verbalized understanding of discharge instructions. NAD at this time. 

## 2016-07-28 NOTE — Discharge Instructions (Signed)
Ice and elevate to reduce swelling and pain. Wear splint until seen by the orthopedist. You may take ibuprofen as needed for pain and inflammation. Norco is for severe pain. Do not drive while taking this medication.

## 2016-07-28 NOTE — ED Provider Notes (Signed)
[facility] Emergency Department Provider Note  ____________________________________________   First MD Initiated Contact with Patient 07/28/16 [phone]     (approximate)  I have reviewed the triage vital signs and the nursing notes.   HISTORY  Chief Complaint Hand Pain   HPI Carla Dickson is a 45 y.o. female chief complaint of right hand pain since falling on Saturday. Patient states that she tripped falling forward. She denies any head injury or loss of consciousness. She is continue to have pain to her right hand. She denies any prior fractures in her hand. She still continues to have swelling and decreased movement of her finger secondary to pain. Currently she rates her pain as a 9/10 at this time.   Past Medical History:  Diagnosis Date  . Hypercholesteremia   . Hypertension   . Reflux     There are no active problems to display for this patient.   Past Surgical History:  Procedure Laterality Date  . ABDOMINAL HYSTERECTOMY      Prior to Admission medications   Medication Sig Start Date End Date Taking? Authorizing Provider  HYDROcodone-acetaminophen (NORCO/VICODIN) 5-325 MG tablet Take 1 tablet by mouth every 4 (four) hours as needed for moderate pain. 07/28/16   Tommi Rumpshonda L Summers, PA-C  oxyCODONE-acetaminophen (PERCOCET) 7.5-325 MG tablet Take 1 tablet by mouth every 4 (four) hours as needed for severe pain. 12/17/15 12/16/16  Joni Reiningonald K Smith, PA-C    Allergies Review of patient's allergies indicates no known allergies.  No family history on file.  Social History Social History  Substance Use Topics  . Smoking status: Current Every Day Smoker    Packs/day: 0.50    Types: Cigarettes  . Smokeless tobacco: Never Used  . Alcohol use Yes    Review of Systems Constitutional: No fever/chills Cardiovascular: Denies chest pain. Respiratory: Denies shortness of breath. Gastrointestinal:   No nausea, no vomiting.   Musculoskeletal:  Negative for back pain. Positive right hand pain. Skin: Negative for rash. Positive ecchymosis and swelling right hand. Neurological: Negative for headaches, focal weakness or numbness.  10-point ROS otherwise negative.  ____________________________________________   PHYSICAL EXAM:  VITAL SIGNS: ED Triage Vitals  Enc Vitals Group     BP 07/28/16 0832 121/83     Pulse Rate 07/28/16 0832 100     Resp 07/28/16 0832 17     Temp 07/28/16 0832 97.6 F (36.4 C)     Temp Source 07/28/16 0832 Oral     SpO2 07/28/16 0832 100 %     Weight 07/28/16 0837 160 lb (72.6 kg)     Height 07/28/16 0837 5\' 4"  (1.626 m)     Head Circumference --      Peak Flow --      Pain Score 07/28/16 0837 9     Pain Loc --      Pain Edu? --      Excl. in GC? --     Constitutional: Alert and oriented. Well appearing and in no acute distress. Eyes: Conjunctivae are normal. PERRL. EOMI. Head: Atraumatic. Nose: No congestion/rhinnorhea. Neck: No stridor.   Cardiovascular: Normal rate, regular rhythm. Grossly normal heart sounds.  Good peripheral circulation. Respiratory: Normal respiratory effort.  No retractions. Lungs CTAB. Musculoskeletal: Right hand with soft tissue swelling over the fourth and fifth metacarpal dorsal aspect. Range of motion of the digits distal to the injury is slow but able to flex and extend fully. Motor sensory function intact distally. Neurologic:  Normal speech  and language. No gross focal neurologic deficits are appreciated. No gait instability. Skin:  Skin is warm, dry and intact. Ecchymosis on the dorsum of the right hand. Psychiatric: Mood and affect are normal. Speech and behavior are normal.  ____________________________________________   LABS (all labs ordered are listed, but only abnormal results are displayed)  Labs Reviewed - No data to display  RADIOLOGY  Right hand x-ray per radiologist: Nondisplaced fracture of the proximal fifth metacarpal.  I, Tommi Rumpshonda L  Summers, personally viewed and evaluated these images (plain radiographs) as part of my medical decision making, as well as reviewing the written report by the radiologist. ____________________________________________   PROCEDURES  Procedure(s) performed: None  Procedures  Critical Care performed: No  ____________________________________________   INITIAL IMPRESSION / ASSESSMENT AND PLAN / ED COURSE  Pertinent labs & imaging results that were available during my care of the patient were reviewed by me and considered in my medical decision making (see chart for details).    Clinical Course  Patient was placed in an OCL splint to be worn until she sees the orthopedist. Patient was given a prescription for Norco as needed for severe pain. She is to ice and elevate as needed for swelling and to help with pain control. She was given information to call and make an appointment with Dr. Rosita KeaMenz who is the orthopedist on call today.   ____________________________________________   FINAL CLINICAL IMPRESSION(S) / ED DIAGNOSES  Final diagnoses:  Closed nondisplaced fracture of base of fifth metacarpal bone of right hand, initial encounter      NEW MEDICATIONS STARTED DURING THIS VISIT:  Discharge Medication List as of 07/28/2016  9:31 AM    START taking these medications   Details  HYDROcodone-acetaminophen (NORCO/VICODIN) 5-325 MG tablet Take 1 tablet by mouth every 4 (four) hours as needed for moderate pain., Starting Wed 07/28/2016, Print         Note:  This document was prepared using Dragon voice recognition software and may include unintentional dictation errors.    Tommi RumpsRhonda L Summers, PA-C 07/28/16 1645    Nita Sicklearolina Veronese, MD 07/29/16 2056

## 2016-11-23 ENCOUNTER — Encounter: Payer: Self-pay | Admitting: *Deleted

## 2016-11-23 DIAGNOSIS — M25522 Pain in left elbow: Secondary | ICD-10-CM | POA: Diagnosis present

## 2016-11-23 DIAGNOSIS — M7032 Other bursitis of elbow, left elbow: Secondary | ICD-10-CM | POA: Diagnosis not present

## 2016-11-23 DIAGNOSIS — Y939 Activity, unspecified: Secondary | ICD-10-CM | POA: Insufficient documentation

## 2016-11-23 DIAGNOSIS — F17210 Nicotine dependence, cigarettes, uncomplicated: Secondary | ICD-10-CM | POA: Insufficient documentation

## 2016-11-23 DIAGNOSIS — I10 Essential (primary) hypertension: Secondary | ICD-10-CM | POA: Insufficient documentation

## 2016-11-23 NOTE — ED Triage Notes (Addendum)
Pt presents w/ c/o L knee pain x 2 weeks. Pt denies recent injury. Pt has been tramadol that was rx'd by PCP w/o relief.  In the middle of triage, pt began c/o pain in L elbow and when asked if she was here to be seen about her L elbow or her L knee, she stated her elbow hurt w/orse than her knee.

## 2016-11-24 ENCOUNTER — Emergency Department
Admission: EM | Admit: 2016-11-24 | Discharge: 2016-11-24 | Disposition: A | Discharge status: Home / Self Care | Payer: BLUE CROSS/BLUE SHIELD | Attending: Emergency Medicine | Admitting: Emergency Medicine

## 2016-11-24 DIAGNOSIS — M7032 Other bursitis of elbow, left elbow: Secondary | ICD-10-CM

## 2016-11-24 MED ORDER — KETOROLAC TROMETHAMINE 30 MG/ML IJ SOLN
60.0000 mg | Freq: Once | INTRAMUSCULAR | Status: AC
  Administered 2016-11-24: 60 mg via INTRAMUSCULAR
  Filled 2016-11-24: qty 2

## 2016-11-24 MED ORDER — NAPROXEN 500 MG PO TABS: 500.0000 mg | ORAL_TABLET | Freq: Two times a day (BID) | ORAL | 0 refills | Status: DC

## 2016-11-24 NOTE — Discharge Instructions (Signed)
1. You may take Naprosyn twice daily as needed for pain. 2. You may wear sling as needed for comfort. 3. Return to the ER for worsening symptoms, persistent vomiting, difficulty breathing or other concerns.

## 2016-11-24 NOTE — ED Provider Notes (Signed)
[facility] Emergency Department Provider Note   ____________________________________________   First MD Initiated Contact with Patient 11/24/16 0119     (approximate)  I have reviewed the triage vital signs and the nursing notes.   HISTORY  Chief Complaint Arm Pain and Leg Pain    HPI Carla Dickson is a 46 y.o. female who presents to the ED from home with a chief complaint of left elbow pain. Initially, patient complained to triage nurse of increasing left knee pain 2 weeks. However, during triage she changed her chief complaint to left elbow pain times one week. Denies trauma/injury/fall. 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 Complains of left elbow pain without associated numbness, tingling or extremity weakness. Denies recent fever, chills, neck pain, chest pain, shortness of breath, abdominal pain, nausea, vomiting, diarrhea. Reports left knee pain x one year since suffering a fall.Nothing makes her elbow pain better. Movement makes her pain worse.   Past Medical History:  Diagnosis Date  . Hypercholesteremia   . Hypertension   . Reflux     There are no active problems to display for this patient.   Past Surgical History:  Procedure Laterality Date  . ABDOMINAL HYSTERECTOMY      Prior to Admission medications   Medication Sig Start Date End Date Taking? Authorizing Provider  HYDROcodone-acetaminophen (NORCO/VICODIN) 5-325 MG tablet Take 1 tablet by mouth every 4 (four) hours as needed for moderate pain. 07/28/16   Tommi Rumpshonda L Summers, PA-C  oxyCODONE-acetaminophen (PERCOCET) 7.5-325 MG tablet Take 1 tablet by mouth every 4 (four) hours as needed for severe pain. 12/17/15 12/16/16  Joni Reiningonald K Smith, PA-C    Allergies Patient has no known allergies.  History reviewed. No pertinent family history.  Social History Social History  Substance Use Topics  . Smoking status: Current Every Day Smoker    Packs/day: 0.50    Types:  Cigarettes  . Smokeless tobacco: Never Used  . Alcohol use Yes    Review of Systems  Constitutional: No fever/chills. Eyes: No visual changes. ENT: No sore throat. Cardiovascular: Denies chest pain. Respiratory: Denies shortness of breath. Gastrointestinal: No abdominal pain.  No nausea, no vomiting.  No diarrhea.  No constipation. Genitourinary: Negative for dysuria. Musculoskeletal: Positive for left elbow and knee pain. Negative for back pain. Skin: Negative for rash. Neurological: Negative for headaches, focal weakness or numbness.  10-point ROS otherwise negative.  ____________________________________________   PHYSICAL EXAM:  VITAL SIGNS: ED Triage Vitals  Enc Vitals Group     BP 11/23/16 2234 (!) 153/95     Pulse Rate 11/23/16 2234 (!) 117     Resp 11/23/16 2234 20     Temp 11/23/16 2234 98.6 F (37 C)     Temp Source 11/23/16 2234 Oral     SpO2 11/23/16 2234 95 %     Weight 11/23/16 2235 146 lb (66.2 kg)     Height 11/23/16 2235 5\' 4"  (1.626 m)     Head Circumference --      Peak Flow --      Pain Score 11/23/16 2235 8     Pain Loc --      Pain Edu? --      Excl. in GC? --     Constitutional: Alert and oriented. Well appearing and in no acute distress. Eyes: Conjunctivae are normal. PERRL. EOMI. Head: Atraumatic. Nose: No congestion/rhinnorhea. Mouth/Throat: Mucous membranes are moist.  Oropharynx non-erythematous. Neck: No stridor.  No cervical spine tenderness to palpation.  Cardiovascular: Normal rate, regular rhythm. Grossly normal heart sounds.  Good peripheral circulation. Respiratory: Normal respiratory effort.  No retractions. Lungs CTAB. Gastrointestinal: Soft and nontender. No distention. No abdominal bruits. No CVA tenderness. Musculoskeletal: Left elbow with slight effusion, tender to palpation and pain on range of motion. 2+ radial pulses. Brisk, less than 5 second capillary refill. Symmetrically warm limb without evidence for ischemia. Left  knee mildly tender to palpation anteriorly. Will range of motion with minimal pain. Calf supple without evidence for compartment syndrome. 2+ distal pulses. Brisk, less than 5 second capillary refill. Symmetrically warm limb without evidence for ischemia.  Neurologic:  Normal speech and language. No gross focal neurologic deficits are appreciated. Skin:  Skin is warm, dry and intact. No rash noted. Psychiatric: Mood and affect are normal. Speech and behavior are normal.  ____________________________________________   LABS (all labs ordered are listed, but only abnormal results are displayed)  Labs Reviewed - No data to display ____________________________________________  EKG  None ____________________________________________  RADIOLOGY  None ____________________________________________   PROCEDURES  Procedure(s) performed: None  Procedures  Critical Care performed: No  ____________________________________________   INITIAL IMPRESSION / ASSESSMENT AND PLAN / ED COURSE  Pertinent labs & imaging results that were available during my care of the patient were reviewed by me and considered in my medical decision making (see chart for details).  46 year old female presenting with nontraumatic left elbow pain consistent with bursitis. Will place on NSAIDs, sling for comfort, and patient will follow-up with orthopedics. Strict return precautions given. Patient and spouse verbalize understanding and agree with plan of care.      ____________________________________________   FINAL CLINICAL IMPRESSION(S) / ED DIAGNOSES  Final diagnoses:  Bursitis of left elbow, unspecified bursa      NEW MEDICATIONS STARTED DURING THIS VISIT:  New Prescriptions   No medications on file     Note:  This document was prepared using Dragon voice recognition software and may include unintentional dictation errors.    Irean Hong, MD 11/24/16 [phone]

## 2016-12-01 ENCOUNTER — Emergency Department
Admission: EM | Admit: 2016-12-01 | Discharge: 2016-12-01 | Disposition: A | Discharge status: Home / Self Care | Payer: BLUE CROSS/BLUE SHIELD | Attending: Emergency Medicine | Admitting: Emergency Medicine

## 2016-12-01 ENCOUNTER — Encounter: Payer: Self-pay | Admitting: Emergency Medicine

## 2016-12-01 DIAGNOSIS — R21 Rash and other nonspecific skin eruption: Secondary | ICD-10-CM

## 2016-12-01 DIAGNOSIS — J309 Allergic rhinitis, unspecified: Secondary | ICD-10-CM

## 2016-12-01 DIAGNOSIS — I10 Essential (primary) hypertension: Secondary | ICD-10-CM | POA: Diagnosis not present

## 2016-12-01 DIAGNOSIS — F17210 Nicotine dependence, cigarettes, uncomplicated: Secondary | ICD-10-CM | POA: Insufficient documentation

## 2016-12-01 DIAGNOSIS — R0981 Nasal congestion: Secondary | ICD-10-CM | POA: Diagnosis present

## 2016-12-01 DIAGNOSIS — G2581 Restless legs syndrome: Secondary | ICD-10-CM

## 2016-12-01 MED ORDER — MOMETASONE FUROATE 50 MCG/ACT NA SUSP: 2.0000 | Freq: Every day | NASAL | 0 refills | Status: DC

## 2016-12-01 NOTE — Discharge Instructions (Signed)
You should put OTC hydrocortisone cream (Cortisone-10) to the rash for itch relief. Start your daily nasal spray as prescribed. Continue your daily nasal decongestant, as prescribed by your primary care provider. Follow-up with your primary MD for ongoing management. Follow-up with ortho for your bursitis and neurology for your restless leg syndrome.

## 2016-12-01 NOTE — ED Provider Notes (Signed)
[facility] Emergency Department Provider Note ____________________________________________  Time seen: 1511  I have reviewed the triage vital signs and the nursing notes.  HISTORY  Chief Complaint  Rash and Nasal Congestion  HPI Carla Dickson is a 46 y.o. female presents to the ED with multiple complaints at this time. Patient describes onset of her rash around that she noted this morning. She is unaware of any particular exposure, or contact. She also reports some nasal congestion without nosebleed or fevers. She has not taken any medications for symptom relief. Her final complaint is shaking legs, but she does admit to a history of restless leg syndrome. She describes she has been previously referred to neurology for sleep studies and further evaluation, but has not managed to make or keep any appointments. She denies any recent illness, injury, or exposures. She denies any fevers, chills, sweats.  Past Medical History:  Diagnosis Date  . Hypercholesteremia   . Hypertension   . Reflux     There are no active problems to display for this patient.   Past Surgical History:  Procedure Laterality Date  . ABDOMINAL HYSTERECTOMY      Prior to Admission medications   Medication Sig Start Date End Date Taking? Authorizing Provider  atorvastatin (LIPITOR) 40 MG tablet Take 40 mg by mouth daily.   Yes Historical Provider, MD  cloNIDine (CATAPRES) 0.1 MG tablet Take 0.1 mg by mouth 2 (two) times daily.   Yes Historical Provider, MD  hydrochlorothiazide (HYDRODIURIL) 25 MG tablet Take 25 mg by mouth daily.   Yes Historical Provider, MD  HYDROcodone-acetaminophen (NORCO/VICODIN) 5-325 MG tablet Take 1 tablet by mouth every 4 (four) hours as needed for moderate pain. 07/28/16   Tommi Rumps, PA-C  mometasone (NASONEX) 50 MCG/ACT nasal spray Place 2 sprays into the nose daily. 12/01/16 01/30/17  Ardene Remley V Bacon Eather Chaires, PA-C  naproxen (NAPROSYN) 500 MG tablet Take 1  tablet (500 mg total) by mouth 2 (two) times daily with a meal. 11/24/16   Irean Hong, MD  oxyCODONE-acetaminophen (PERCOCET) 7.5-325 MG tablet Take 1 tablet by mouth every 4 (four) hours as needed for severe pain. 12/17/15 12/16/16  Joni Reining, PA-C    Allergies Patient has no known allergies.  No family history on file.  Social History Social History  Substance Use Topics  . Smoking status: Current Every Day Smoker    Packs/day: 0.50    Types: Cigarettes  . Smokeless tobacco: Never Used  . Alcohol use Yes    Review of Systems  Constitutional: Negative for fever. Eyes: Negative for visual changes. ENT: Negative for sore throat. Nasal congestion  Cardiovascular: Negative for chest pain. Respiratory: Negative for shortness of breath. Gastrointestinal: Negative for abdominal pain, vomiting and diarrhea. Genitourinary: Negative for dysuria. Musculoskeletal: Negative for back pain. Restless leg syndrome Skin: Positive for rash. Neurological: Negative for headaches, focal weakness or numbness. ____________________________________________  PHYSICAL EXAM:  VITAL SIGNS: ED Triage Vitals [12/01/16 1449]  Enc Vitals Group     BP (!) 155/95     Pulse Rate (!) 102     Resp 16     Temp 98.9 F (37.2 C)     Temp Source Oral     SpO2 98 %     Weight 146 lb (66.2 kg)     Height 5\' 4"  (1.626 m)     Head Circumference      Peak Flow      Pain Score 4  Pain Loc      Pain Edu?      Excl. in GC?     Constitutional: Alert and oriented. Well appearing and in no distress. Head: Normocephalic and atraumatic. Eyes: Conjunctivae are normal. PERRL. Normal extraocular movements Ears: Canals clear. TMs intact bilaterally. Nose: No congestion/rhinorrhea/epistaxis. Mouth/Throat: Mucous membranes are moist. Uvula is midline and tonsils are flat. No oropharyngeal lesions are appreciated. Neck: Supple. No thyromegaly. Hematological/Lymphatic/Immunological: No cervical  lymphadenopathy. Cardiovascular: Normal rate, regular rhythm. Normal distal pulses. Respiratory: Normal respiratory effort. No wheezes/rales/rhonchi. Gastrointestinal: Soft and nontender. No distention. Musculoskeletal: Nontender with normal range of motion in all extremities.  Neurologic:  Normal gait without ataxia. Normal speech and language. No gross focal neurologic deficits are appreciated. Skin:  Skin is warm, dry and intact. No rash noted. Patient with multiple superficial excoriations to the right forearm. There is dried scab noticed to the same areas without any signs of lymphangitis, induration, or cellulitis. Psychiatric: Mood and affect are normal. Patient exhibits appropriate insight and judgment. ___________________________________________  INITIAL IMPRESSION / ASSESSMENT AND PLAN / ED COURSE  Patient with a "rash" that appears to be excoriated skin, without signs of local cellulitis. She was diagnosed with a bursitis of the left elbow last week, but has failed to follow with orthopedics as previously referred. He was also given a prescription for naproxen which she admits that she did not fill, instead dosing trazodone, primary care provider. She has a secondary complaint of nasal congestion. For that she's been prescribed Nasonex to dose as directed. She reports shaking legs, but has a history of RLS which is not an acute diagnosis. She is being managed by her primary care provider with pending referral to neurology. Patient is discharged with instruction to follow-up with primary care provider for ongoing symptom management. Work note is provided for today as requested. ____________________________________________  FINAL CLINICAL IMPRESSION(S) / ED DIAGNOSES  Final diagnoses:  Rash  Chronic allergic rhinitis, unspecified seasonality, unspecified trigger  RLS (restless legs syndrome)      Lissa HoardJenise V Bacon Wai Litt, PA-C 12/01/16 2359    Minna AntisKevin Paduchowski, MD 12/02/16  [phone]

## 2016-12-01 NOTE — ED Triage Notes (Signed)
Pt reports rash to right arm since this AM, reports legs "can't stop shaking" and nasal congestion.

## 2016-12-01 NOTE — ED Notes (Signed)
See triage note  States she was seen on 2/28 and dx'd with bursitis.  But states she noticed some drainage for rash on forearm  No fever   Also states she developed some shaking to legs and felt like it was diff to walk   Per pt this started sudden onset this afternoon

## 2016-12-20 ENCOUNTER — Other Ambulatory Visit: Payer: Self-pay | Admitting: Family Medicine

## 2016-12-20 DIAGNOSIS — Z1231 Encounter for screening mammogram for malignant neoplasm of breast: Secondary | ICD-10-CM

## 2017-01-27 ENCOUNTER — Encounter: Payer: Self-pay | Admitting: Intensive Care

## 2017-01-27 ENCOUNTER — Emergency Department
Admission: EM | Admit: 2017-01-27 | Discharge: 2017-01-27 | Disposition: A | Discharge status: Home / Self Care | Payer: BLUE CROSS/BLUE SHIELD | Attending: Emergency Medicine | Admitting: Emergency Medicine

## 2017-01-27 DIAGNOSIS — I10 Essential (primary) hypertension: Secondary | ICD-10-CM | POA: Diagnosis not present

## 2017-01-27 DIAGNOSIS — Z79899 Other long term (current) drug therapy: Secondary | ICD-10-CM | POA: Diagnosis not present

## 2017-01-27 DIAGNOSIS — F10229 Alcohol dependence with intoxication, unspecified: Secondary | ICD-10-CM | POA: Insufficient documentation

## 2017-01-27 DIAGNOSIS — R791 Abnormal coagulation profile: Secondary | ICD-10-CM | POA: Diagnosis not present

## 2017-01-27 DIAGNOSIS — R200 Anesthesia of skin: Secondary | ICD-10-CM

## 2017-01-27 DIAGNOSIS — F17210 Nicotine dependence, cigarettes, uncomplicated: Secondary | ICD-10-CM | POA: Insufficient documentation

## 2017-01-27 DIAGNOSIS — R202 Paresthesia of skin: Secondary | ICD-10-CM

## 2017-01-27 LAB — COMPREHENSIVE METABOLIC PANEL
ALBUMIN: 4.3 g/dL (ref 3.5–5.0)
ALK PHOS: 92 U/L (ref 38–126)
ALT: 19 U/L (ref 14–54)
AST: 59 U/L — AB (ref 15–41)
Anion gap: 14 (ref 5–15)
BUN: 13 mg/dL (ref 6–20)
CALCIUM: 9.1 mg/dL (ref 8.9–10.3)
CHLORIDE: 100 mmol/L — AB (ref 101–111)
CO2: 26 mmol/L (ref 22–32)
CREATININE: 0.8 mg/dL (ref 0.44–1.00)
GFR calc non Af Amer: >60 mL/min (ref >60)
GLUCOSE: 101 mg/dL — AB (ref 65–99)
Potassium: 4.1 mmol/L (ref 3.5–5.1)
SODIUM: 140 mmol/L (ref 135–145)
Total Bilirubin: 1.4 mg/dL — ABNORMAL HIGH (ref 0.3–1.2)
Total Protein: 8.6 g/dL — ABNORMAL HIGH (ref 6.5–8.1)

## 2017-01-27 LAB — URINALYSIS, COMPLETE (UACMP) WITH MICROSCOPIC
BACTERIA UA: NONE SEEN
BILIRUBIN URINE: NEGATIVE
Glucose, UA: NEGATIVE mg/dL
Ketones, ur: NEGATIVE mg/dL
NITRITE: NEGATIVE
PH: 5 (ref 5.0–8.0)
Protein, ur: 30 mg/dL — AB
SPECIFIC GRAVITY, URINE: 1.01 (ref 1.005–1.030)

## 2017-01-27 LAB — CBC WITH DIFFERENTIAL/PLATELET
BASOS ABS: 0 10*3/uL (ref 0–0.1)
BASOS PCT: 1 %
Eosinophils Absolute: 0.1 10*3/uL (ref 0–0.7)
Eosinophils Relative: 2 %
HEMATOCRIT: 38.8 % (ref 35.0–47.0)
HEMOGLOBIN: 13.1 g/dL (ref 12.0–16.0)
LYMPHS PCT: 46 %
Lymphs Abs: 2.8 10*3/uL (ref 1.0–3.6)
MCH: 33.7 pg (ref 26.0–34.0)
MCHC: 33.7 g/dL (ref 32.0–36.0)
MCV: 100 fL (ref 80.0–100.0)
MONO ABS: 0.4 10*3/uL (ref 0.2–0.9)
Monocytes Relative: 7 %
NEUTROS ABS: 2.6 10*3/uL (ref 1.4–6.5)
NEUTROS PCT: 44 %
Platelets: 149 10*3/uL — ABNORMAL LOW (ref 150–440)
RBC: 3.88 MIL/uL (ref 3.80–5.20)
RDW: 20 % — ABNORMAL HIGH (ref 11.5–14.5)
WBC: 6 10*3/uL (ref 3.6–11.0)

## 2017-01-27 LAB — ETHANOL: Alcohol, Ethyl (B): 333 mg/dL (ref <5)

## 2017-01-27 LAB — PROTIME-INR

## 2017-01-27 LAB — MAGNESIUM: Magnesium: 1.7 mg/dL (ref 1.7–2.4)

## 2017-01-27 LAB — TSH: TSH: 0.691 u[IU]/mL (ref 0.350–4.500)

## 2017-01-27 LAB — AMMONIA: AMMONIA: 49 umol/L — AB (ref 9–35)

## 2017-01-27 MED ORDER — SODIUM CHLORIDE 0.9 % IV BOLUS (SEPSIS)
1000.0000 mL | Freq: Once | INTRAVENOUS | Status: AC
Start: 2017-01-27 — End: 2017-01-27
  Administered 2017-01-27: 1000 mL via INTRAVENOUS

## 2017-01-27 NOTE — Discharge Instructions (Signed)
As we discussed, your workup was reassuring today that you do not have an acute or emergent medical condition.  We believe that your symptoms are most likely due to alcohol intoxication and dependence.  Please discuss with your primary care doctor or the appropriate way to begin the process of decreasing your dependence on alcohol.  Do not try to stop "cold [place]" because this can be dangerous for your health as well.  We also provided to other outpatient resources ([facility] and [facility]) to whom you can speak for assistance with substance abuse and rehabilitation.    Return to the emergency department if you develop new or worsening symptoms that concern you.

## 2017-01-27 NOTE — ED Notes (Signed)
Dr. York CeriseForbach notified of critical Ethanol of 333

## 2017-01-27 NOTE — ED Notes (Signed)
Pt called in lobby, no response 

## 2017-01-27 NOTE — ED Notes (Signed)
Pt called out, states she is ready to go.  Instructed pt that her husband must be here to pick her up prior to discharge due to her alcohol intoxication, pt verbalizes understanding.  IVF will continue to run until husband arrives.  Will discharge pt at that time.

## 2017-01-27 NOTE — ED Provider Notes (Signed)
[facility] Emergency Department Provider Note  ____________________________________________   None    (approximate)  I have reviewed the triage vital signs and the nursing notes.   HISTORY  Chief Complaint Numbness  Level 5 caveat:  history/ROS may be limited by acute intoxication, although she appears clinically sober  HPI Carla Dickson is a 46 y.o. female who reports a medical history as listed below who presents for evaluation of about 1 week of numbness and tingling in her toes.  She also reports tingling in her hands at times.  She thinks that has been present for about 2 days although it is not present now.  She has not had any headache, facial droop, difficulty speaking, head injury, or recent falls.  She reports no weakness in any of her extremities and is able to ambulate and did so at the triage desk.  She denies fever/chills, chest pain, shortness of breath, nausea, vomiting, abdominal pain, dysuria.  She occasionally reports pain in her toes as well.   Past Medical History:  Diagnosis Date  . Hypercholesteremia   . Hypertension   . Reflux     There are no active problems to display for this patient.   Past Surgical History:  Procedure Laterality Date  . ABDOMINAL HYSTERECTOMY      Prior to Admission medications   Medication Sig Start Date End Date Taking? Authorizing Provider  atorvastatin (LIPITOR) 40 MG tablet Take 40 mg by mouth daily.    Historical Provider, MD  cloNIDine (CATAPRES) 0.1 MG tablet Take 0.1 mg by mouth 2 (two) times daily.    Historical Provider, MD  hydrochlorothiazide (HYDRODIURIL) 25 MG tablet Take 25 mg by mouth daily.    Historical Provider, MD  HYDROcodone-acetaminophen (NORCO/VICODIN) 5-325 MG tablet Take 1 tablet by mouth every 4 (four) hours as needed for moderate pain. 07/28/16   Tommi Rumps, PA-C  mometasone (NASONEX) 50 MCG/ACT nasal spray Place 2 sprays into the nose daily. 12/01/16 01/30/17  Jenise  V Bacon Menshew, PA-C  naproxen (NAPROSYN) 500 MG tablet Take 1 tablet (500 mg total) by mouth 2 (two) times daily with a meal. 11/24/16   Irean Hong, MD    Allergies Patient has no known allergies.  History reviewed. No pertinent family history.  Social History Social History  Substance Use Topics  . Smoking status: Current Every Day Smoker    Packs/day: 0.50    Types: Cigarettes  . Smokeless tobacco: Never Used  . Alcohol use 14.4 oz/week    24 Shots of liquor per week     Comment: 2-3 drinks liquor per day    Review of Systems Constitutional: No fever/chills Eyes: No visual changes. ENT: No sore throat. Cardiovascular: Denies chest pain. Respiratory: Denies shortness of breath. Gastrointestinal: No abdominal pain.  No nausea, no vomiting.  No diarrhea.  No constipation. Genitourinary: Negative for dysuria. Musculoskeletal: Negative for back pain. Integumentary: Negative for rash. Neurological: Numbness and tingling in her toes for about 1 week with occasional tingling in her hands.  No weakness in any of her extremities.   ____________________________________________   PHYSICAL EXAM:  VITAL SIGNS: ED Triage Vitals  Enc Vitals Group     BP 01/27/17 1346 134/88     Pulse Rate 01/27/17 1346 (!) 121     Resp 01/27/17 1346 18     Temp 01/27/17 1346 98.6 F (37 C)     Temp Source 01/27/17 1346 Oral     SpO2 01/27/17 1346  94 %     Weight 01/27/17 1347 140 lb (63.5 kg)     Height 01/27/17 1347 5\' 6"  (1.676 m)     Head Circumference --      Peak Flow --      Pain Score 01/27/17 1346 6     Pain Loc --      Pain Edu? --      Excl. in GC? --     Constitutional: Alert and oriented. Well appearing and in no acute distress. Eyes: Conjunctivae are normal. PERRL. EOMI. Head: Atraumatic. Nose: No congestion/rhinnorhea. Mouth/Throat: Mucous membranes are moist. Neck: No stridor.  No meningeal signs.   Cardiovascular: Normal rate, regular rhythm. Good peripheral  circulation. Grossly normal heart sounds. Respiratory: Normal respiratory effort.  No retractions. Lungs CTAB. Gastrointestinal: Soft and nontender. No distention.  Musculoskeletal: No lower extremity tenderness nor edema. No gross deformities of extremities.  Neurovascularly intact down to and including her toes with normal capillary refill. Neurologic:  Normal speech and language.  No facial droop noted and cranial nerves appear grossly intact.  She has normal and equal strength in bilateral upper and lower extremities.  She has light touch sensation when I touch each of her toes bilaterally.  She has no dysmetria (normal finger-to-nose), no pronator drift, ambulates without difficulty, normal grip strength. Skin:  Skin is warm, dry and intact. No rash noted. Psychiatric: Mood and affect are normal. Speech and behavior are normal.  ____________________________________________   LABS (all labs ordered are listed, but only abnormal results are displayed)  Labs Reviewed  URINALYSIS, COMPLETE (UACMP) WITH MICROSCOPIC - Abnormal; Notable for the following:       Result Value   Color, Urine YELLOW (*)    APPearance CLEAR (*)    Hgb urine dipstick SMALL (*)    Protein, ur 30 (*)    Leukocytes, UA TRACE (*)    Squamous Epithelial / LPF 0-5 (*)    All other components within normal limits  CBC WITH DIFFERENTIAL/PLATELET - Abnormal; Notable for the following:    RDW 20.0 (*)    Platelets 149 (*)    All other components within normal limits  AMMONIA - Abnormal; Notable for the following:    Ammonia 49 (*)    All other components within normal limits  COMPREHENSIVE METABOLIC PANEL - Abnormal; Notable for the following:    Chloride 100 (*)    Glucose, Bld 101 (*)    Total Protein 8.6 (*)    AST 59 (*)    Total Bilirubin 1.4 (*)    All other components within normal limits  ETHANOL - Abnormal; Notable for the following:    Alcohol, Ethyl (B) 333 (*)    All other components within normal  limits  PROTIME-INR  TSH  MAGNESIUM  PROTIME-INR  APTT   ____________________________________________  EKG  ED ECG REPORT I, Cheri Ayotte, the attending physician, personally viewed and interpreted this ECG.  Date: 01/27/2017 EKG Time: 13:52 Rate: 117 Rhythm: sinus tachycardia QRS Axis: LAD Intervals: normal ST/T Wave abnormalities: Non-specific ST segment / T-wave changes, but no evidence of acute ischemia. Conduction Disturbances: none Narrative Interpretation: unremarkable  ____________________________________________  RADIOLOGY   No results found.  ____________________________________________   PROCEDURES  Critical Care performed: No   Procedure(s) performed:   Procedures   ____________________________________________   INITIAL IMPRESSION / ASSESSMENT AND PLAN / ED COURSE  Pertinent labs & imaging results that were available during my care of the patient were reviewed by me and  considered in my medical decision making (see chart for details).  The patient has a reassuring exam with no evidence of CVA.  She has some mild tachycardia but no other symptoms.  She has a blood alcohol level of 333.  She initially did not disclose this and first said that she had only had 2 small drinks today but then admitted that the 2 drinks that she had were "white liquor".  After I pressed her more about how high her level was she does admit that she drinks heavily every day and that it has been a problem for quite some time.  Counseled her about the need to follow up with her primary care doctor or one of the outpatient facility such as [facility] and [facility].  I made it clear to her that given her long-term alcohol abuse and how functional she is at a high level she should not try to stop all at once because that could be dangerous.  I encouraged her to call her regular doctor and discuss it with her or contact [facility] at the numbers provided.  I explained to her that at this time she  does not appear to have an acute or emergent medical condition but that she is certainly at risk of injury as well as long-term health consequences of her drinking.  She understands and agrees that she will follow up as an outpatient.  I gave my usual and customary return precautions.         ____________________________________________  FINAL CLINICAL IMPRESSION(S) / ED DIAGNOSES  Final diagnoses:  Alcohol dependence with intoxication with complication (HCC)  Numbness and tingling     MEDICATIONS GIVEN DURING THIS VISIT:  Medications  sodium chloride 0.9 % bolus 1,000 mL (1,000 mLs Intravenous New Bag/Given 01/27/17 1450)     NEW OUTPATIENT MEDICATIONS STARTED DURING THIS VISIT:  New Prescriptions   No medications on file    Modified Medications   No medications on file    Discontinued Medications   No medications on file     Note:  This document was prepared using Dragon voice recognition software and may include unintentional dictation errors.    Loleta Rose, MD 01/27/17 [phone]

## 2017-01-27 NOTE — ED Triage Notes (Addendum)
Patient presents to ER with numbness to toes, bilaterally arms/hands and face X1 week. Speech clear. Patient able to ambulate. Pain occurs in toes when ambulating. No facial droop noted. A&O x4

## 2017-01-27 NOTE — ED Triage Notes (Signed)
FIRST NURSE: pt c/o numb toes. Pt toes noted to be warm.

## 2017-02-02 ENCOUNTER — Ambulatory Visit: Payer: BLUE CROSS/BLUE SHIELD | Attending: Family Medicine

## 2017-03-02 ENCOUNTER — Ambulatory Visit: Payer: BLUE CROSS/BLUE SHIELD | Admitting: Occupational Therapy

## 2017-03-14 ENCOUNTER — Encounter: Payer: Self-pay | Admitting: Occupational Therapy

## 2017-03-14 ENCOUNTER — Ambulatory Visit: Payer: BLUE CROSS/BLUE SHIELD | Attending: Orthopedic Surgery | Admitting: Occupational Therapy

## 2017-03-14 DIAGNOSIS — M6281 Muscle weakness (generalized): Secondary | ICD-10-CM

## 2017-03-14 DIAGNOSIS — M79602 Pain in left arm: Secondary | ICD-10-CM

## 2017-03-14 DIAGNOSIS — R208 Other disturbances of skin sensation: Secondary | ICD-10-CM | POA: Insufficient documentation

## 2017-03-14 DIAGNOSIS — M25531 Pain in right wrist: Secondary | ICD-10-CM | POA: Diagnosis present

## 2017-03-14 DIAGNOSIS — G5622 Lesion of ulnar nerve, left upper limb: Secondary | ICD-10-CM | POA: Insufficient documentation

## 2017-03-14 DIAGNOSIS — G5603 Carpal tunnel syndrome, bilateral upper limbs: Secondary | ICD-10-CM | POA: Diagnosis present

## 2017-03-14 NOTE — Therapy (Signed)
[facility] PHYSICAL AND SPORTS MEDICINE [address] [place], [address] Phone: [phone]   Fax:  [phone]  Occupational Therapy Evaluation  Patient Details  Name: Carla Dickson MRN: [id number] Date of Birth: [birth year] Referring Provider: Rosita Kea  Encounter Date: 03/14/2017      OT End of Session - 03/14/17 1030    Visit Number 1   Number of Visits 8   Date for OT Re-Evaluation 04/11/17   OT Start Time 0931   OT Stop Time 1025   OT Time Calculation (min) 54 min   Activity Tolerance Patient tolerated treatment well   Behavior During Therapy [facility] for tasks assessed/performed      Past Medical History:  Diagnosis Date  . Fracture of fifth metacarpal bone of left hand with routine healing   . Hypercholesteremia   . Hypertension   . Reflux     Past Surgical History:  Procedure Laterality Date  . ABDOMINAL HYSTERECTOMY      There were no vitals filed for this visit.      Subjective Assessment - 03/14/17 0938    Subjective  Symptoms started about 2 months ago on my L - I did broke my pinkie in Dec - the  numbness  and hurting L hand and wrist longer than R ( but the R wrist bother me for about 3 yrs on and off) - and L pinkie can really hurt and go numb into forearm but has been going on more than 6 months    Patient Stated Goals Get my hands better - that I don't have to have surgery - and I need to find a new job    Currently in Pain? Yes   Pain Score 9    Pain Orientation Right;Left   Pain Descriptors / Indicators Aching   Pain Type Chronic pain   Pain Onset More than a month ago   Pain Frequency Occasional           [facility] OT Assessment - 03/14/17 0001      Assessment   Diagnosis Bilateral CTS and L ulnar N    Referring Provider Menz   Onset Date 12/24/16     Precautions   Required Braces or Orthoses --  Wrist splints last visit from MD for night time      Balance Screen   Has the patient fallen in  the past 6 months Yes   How many times? 1   Has the patient had a decrease in activity level because of a fear of falling?  No   Is the patient reluctant to leave their home because of a fear of falling?  No     Prior Function   Vocation Unemployed   Leisure R hand dominant , likes read, [employer] , dance little , do own house work , play games on phone and tablet     AROM   Right Wrist Extension 65 Degrees   Right Wrist Flexion 73 Degrees   Left Wrist Extension 75 Degrees   Left Wrist Flexion 65 Degrees     Strength   Right Hand Grip (lbs) 30   Right Hand Lateral Pinch 9 lbs   Right Hand 3 Point Pinch 13 lbs   Left Hand Grip (lbs) 6   Left Hand Lateral Pinch 5 lbs   Left Hand 3 Point Pinch 4 lbs     Right Hand AROM   R Thumb Opposition to Index --  Opposition  to base of pinkie but pain    R Index  MCP 0-90 85 Degrees   R Index PIP 0-100 100 Degrees   R Long  MCP 0-90 90 Degrees   R Long PIP 0-100 100 Degrees   R Ring PIP 0-100 100 Degrees   R Little  MCP 0-90 90 Degrees   R Little PIP 0-100 100 Degrees     Left Hand AROM   L Index  MCP 0-90 80 Degrees   L Index PIP 0-100 100 Degrees   L Long  MCP 0-90 85 Degrees   L Long PIP 0-100 100 Degrees   L Ring  MCP 0-90 90 Degrees   L Ring PIP 0-100 100 Degrees   L Little  MCP 0-90 90 Degrees   L Little PIP 0-100 100 Degrees        HEP review with pt  Contrast bath done prior to HEP - to ed pt on HEP  Also fabricated padded elbow sleeve for L elbow to use  See pt instructions                 OT Education - 03/14/17 1030    Education provided Yes   Education Details findings and HEP    Person(s) Educated Patient   Methods Explanation;Tactile cues;Verbal cues;Handout;Demonstration   Comprehension Verbal cues required;Returned demonstration;Verbalized understanding          OT Short Term Goals - 03/14/17 1817      OT SHORT TERM GOAL #1   Title Pain on PRWHE decrease by more than 20 points     Baseline PRWHE for pain at eval 42/50   Time 3   Period Weeks   Status New     OT SHORT TERM GOAL #2   Title Pt to be ind for HEP to CTS and ulnar N compression -  decreasing pain and, sensation issues   Baseline see eval for assessment of symptoms    Time 3   Period Weeks   Status New           OT Long Term Goals - 03/14/17 1820      OT LONG TERM GOAL #1   Title Pt show increase grip in L hand to carry  more than 5 lbs with no issues or symptoms    Baseline Grip strength R 30 , L 6   Time 5   Period Weeks   Status New     OT LONG TERM GOAL #2   Title Prehension strenght in L hand increase by at least 2-3 lbs to open packages    Baseline Prehension on L 4-5 lbs -see flowsheet    Time 5   Period Weeks   Status New     OT LONG TERM GOAL #3   Title Function on PRWHE improve with more than 15 points    Baseline Function score at eval 35.5/50    Time 6   Period Weeks   Status New               Plan - 03/14/17 1031    Clinical Impression Statement Pt refer to therapy for bilateral CTS , but also have Ulnar Nerve compression at L - pt report having symptoms in R wrist on and off for about 3 yrs but got worse since L hand and elbow started - report it started she broked her pinkie - pt is positive for Tinel on R more than L ,  but Positive  Tinel and tenderness in  cubital tunnel, 5th digit on L flex and Abducted  as well as  numbness on ulnar side of hand and forearm - pt show decrease grip  and prehension  L more than R - increase pain in bilateral wrist , L hand and forearm /elbow - pt can benefit from OT for  decrease pain , sensation issues,  modifications of activities , increase ROM and strength    Occupational performance deficits (Please refer to evaluation for details): ADL's;IADL's;Rest and Sleep;Work;Play;Leisure   Rehab Potential Fair   OT Frequency 2x / week   OT Duration 4 weeks   OT Treatment/Interventions Self-care/ADL training;Splinting;Patient/family  education;Therapeutic exercises;Contrast Bath;Ultrasound;Iontophoresis;Manual Therapy   Plan assess progress with HEP , modifications    Clinical Decision Making Several treatment options, min-mod task modification necessary   OT Home Exercise Plan see pt instructions   Consulted and Agree with Plan of Care Patient      Patient will benefit from skilled therapeutic intervention in order to improve the following deficits and impairments:  Decreased range of motion, Impaired flexibility, Impaired sensation, Impaired UE functional use, Pain, Decreased strength, Decreased knowledge of precautions  Visit Diagnosis: Pain in left arm - Plan: Ot plan of care cert/re-cert  Pain in right wrist - Plan: Ot plan of care cert/re-cert  Other disturbances of skin sensation - Plan: Ot plan of care cert/re-cert  Muscle weakness (generalized) - Plan: Ot plan of care cert/re-cert  Carpal tunnel syndrome, bilateral upper limbs - Plan: Ot plan of care cert/re-cert  Ulnar nerve compression, left - Plan: Ot plan of care cert/re-cert    Problem List There are no active problems to display for this patient.   Oletta CohnuPreez, Regnia Mathwig OTR/L,CLT 03/14/2017, 6:27 PM  [facility]TS [facility]ICINE [address] [place], [place], [address] Phone: [phone]   Fax:  [phone]  Name: Carla Dickson MRN: [id number] Date of Birth: 11-Jun-1971

## 2017-03-14 NOTE — Patient Instructions (Signed)
For bilateral hands and wrist  Contrast 3 x day  Wrist splints most all the time for next 3 wks Off for HEP and ADL's   Tendon glides  Med N glides   L elbow - avoid propping up , repetitive flexion and extention , flexion more than 90 degrees  Can do contrast to L elbow  Wear elbow padded elbow sleeve for L elbow - padding for cubital tunnel during day , and night time for inside avoiding more than 90 degrees flexion

## 2017-03-22 ENCOUNTER — Ambulatory Visit: Payer: BLUE CROSS/BLUE SHIELD | Admitting: Occupational Therapy

## 2017-03-24 ENCOUNTER — Ambulatory Visit: Payer: BLUE CROSS/BLUE SHIELD | Admitting: Occupational Therapy

## 2017-07-16 ENCOUNTER — Emergency Department
Admission: EM | Admit: 2017-07-16 | Discharge: 2017-07-16 | Discharge status: Against Medical Advice | Payer: BLUE CROSS/BLUE SHIELD

## 2018-04-01 ENCOUNTER — Emergency Department
Admission: EM | Admit: 2018-04-01 | Discharge: 2018-04-01 | Disposition: A | Discharge status: Home / Self Care | Payer: Self-pay | Attending: Emergency Medicine | Admitting: Emergency Medicine

## 2018-04-01 ENCOUNTER — Other Ambulatory Visit: Payer: Self-pay

## 2018-04-01 ENCOUNTER — Emergency Department: Payer: Self-pay

## 2018-04-01 DIAGNOSIS — R079 Chest pain, unspecified: Secondary | ICD-10-CM

## 2018-04-01 DIAGNOSIS — Y908 Blood alcohol level of 240 mg/100 ml or more: Secondary | ICD-10-CM | POA: Insufficient documentation

## 2018-04-01 DIAGNOSIS — Z7289 Other problems related to lifestyle: Secondary | ICD-10-CM

## 2018-04-01 DIAGNOSIS — F1020 Alcohol dependence, uncomplicated: Secondary | ICD-10-CM | POA: Insufficient documentation

## 2018-04-01 DIAGNOSIS — Z789 Other specified health status: Secondary | ICD-10-CM

## 2018-04-01 DIAGNOSIS — R0789 Other chest pain: Secondary | ICD-10-CM | POA: Insufficient documentation

## 2018-04-01 DIAGNOSIS — Z79899 Other long term (current) drug therapy: Secondary | ICD-10-CM | POA: Insufficient documentation

## 2018-04-01 DIAGNOSIS — F17210 Nicotine dependence, cigarettes, uncomplicated: Secondary | ICD-10-CM | POA: Insufficient documentation

## 2018-04-01 DIAGNOSIS — Z008 Encounter for other general examination: Secondary | ICD-10-CM | POA: Insufficient documentation

## 2018-04-01 DIAGNOSIS — I10 Essential (primary) hypertension: Secondary | ICD-10-CM | POA: Insufficient documentation

## 2018-04-01 LAB — TROPONIN I: Troponin I: <0.03 ng/mL (ref <0.03)

## 2018-04-01 LAB — BASIC METABOLIC PANEL
ANION GAP: 13 (ref 5–15)
BUN: 8 mg/dL (ref 6–20)
CHLORIDE: 105 mmol/L (ref 98–111)
CO2: 29 mmol/L (ref 22–32)
Calcium: 8.8 mg/dL — ABNORMAL LOW (ref 8.9–10.3)
Creatinine, Ser: 0.69 mg/dL (ref 0.44–1.00)
GFR calc Af Amer: >60 mL/min (ref >60)
GFR calc non Af Amer: >60 mL/min (ref >60)
GLUCOSE: 97 mg/dL (ref 70–99)
POTASSIUM: 4.1 mmol/L (ref 3.5–5.1)
Sodium: 147 mmol/L — ABNORMAL HIGH (ref 135–145)

## 2018-04-01 LAB — CBC
HEMATOCRIT: 37.9 % (ref 35.0–47.0)
Hemoglobin: 12.9 g/dL (ref 12.0–16.0)
MCH: 33.7 pg (ref 26.0–34.0)
MCHC: 34.1 g/dL (ref 32.0–36.0)
MCV: 98.9 fL (ref 80.0–100.0)
Platelets: 177 10*3/uL (ref 150–440)
RBC: 3.84 MIL/uL (ref 3.80–5.20)
RDW: 18.7 % — ABNORMAL HIGH (ref 11.5–14.5)
WBC: 5.7 10*3/uL (ref 3.6–11.0)

## 2018-04-01 LAB — ETHANOL: Alcohol, Ethyl (B): 366 mg/dL (ref <10)

## 2018-04-01 MED ORDER — GI COCKTAIL ~~LOC~~
30.0000 mL | Freq: Once | ORAL | Status: AC
  Administered 2018-04-01: 30 mL via ORAL
  Filled 2018-04-01: qty 30

## 2018-04-01 NOTE — ED Notes (Signed)
Notified Physician of ETOH 366, Dr. Derrill KayGoodman

## 2018-04-01 NOTE — Discharge Instructions (Addendum)
Please seek medical attention for any high fevers, chest pain, shortness of breath, change in behavior, persistent vomiting, bloody stool or any other new or concerning symptoms.  

## 2018-04-01 NOTE — BH Assessment (Signed)
Per Steward DroneBrenda @ [facility], female bed is available.

## 2018-04-01 NOTE — ED Provider Notes (Addendum)
[facility] Emergency Department Provider Note   ____________________________________________   I have reviewed the triage vital signs and the nursing notes.   HISTORY  Chief Complaint Alcohol detox  History limited by: Not Limited   HPI Carla Dickson is a 47 y.o. female who presents to the emergency department today with primary concern for alcohol detox.  Patient states that she has been out of work and since she has been out of work she has been drinking regularly.  She has been drinking every day.  Last drink was today.  Patient denies history of [employer] withdrawals although states she feels she needs inpatient detox.  Patient has secondary complaints of chest pain.  Located in her center chest.  This is been present for 1 to 2 weeks.  This is also been accompanied by some upper abdominal pain.  Per medical record review patient has a history of hypertension.  Past Medical History:  Diagnosis Date  . Fracture of fifth metacarpal bone of left hand with routine healing   . Hypercholesteremia   . Hypertension   . Reflux     There are no active problems to display for this patient.   Past Surgical History:  Procedure Laterality Date  . ABDOMINAL HYSTERECTOMY      Prior to Admission medications   Medication Sig Start Date End Date Taking? Authorizing Provider  atorvastatin (LIPITOR) 40 MG tablet Take 40 mg by mouth daily.    [provider]  cloNIDine (CATAPRES) 0.1 MG tablet Take 0.1 mg by mouth 2 (two) times daily.    [provider]  hydrochlorothiazide (HYDRODIURIL) 25 MG tablet Take 25 mg by mouth daily.    [provider]  HYDROcodone-acetaminophen (NORCO/VICODIN) 5-325 MG tablet Take 1 tablet by mouth every 4 (four) hours as needed for moderate pain. 07/28/16   Tommi Rumps, PA-C  mometasone (NASONEX) 50 MCG/ACT nasal spray Place 2 sprays into the nose daily. 12/01/16 01/30/17  Menshew, Charlesetta Ivory, PA-C   naproxen (NAPROSYN) 500 MG tablet Take 1 tablet (500 mg total) by mouth 2 (two) times daily with a meal. 11/24/16   Irean Hong, MD    Allergies Patient has no known allergies.  No family history on file.  Social History Social History   Tobacco Use  . Smoking status: Current Every Day Smoker    Packs/day: 0.50    Types: Cigarettes  . Smokeless tobacco: Never Used  Substance Use Topics  . Alcohol use: Yes    Alcohol/week: 14.4 oz    Types: 24 Shots of liquor per week    Comment: 2-3 drinks liquor per day  . Drug use: No    Review of Systems Constitutional: No fever/chills Eyes: No visual changes. ENT: No sore throat. Cardiovascular: Positive for chest pain. Respiratory: Denies shortness of breath. Gastrointestinal: No abdominal pain.  No nausea, no vomiting.  No diarrhea.   Genitourinary: Negative for dysuria. Musculoskeletal: Negative for back pain. Skin: Negative for rash. Neurological: Negative for headaches, focal weakness or numbness.  ____________________________________________   PHYSICAL EXAM:  VITAL SIGNS: ED Triage Vitals  Enc Vitals Group     BP 04/01/18 1459 (!) 152/128     Pulse Rate 04/01/18 1459 100     Resp 04/01/18 1459 18     Temp --      Temp src --      SpO2 04/01/18 1459 100 %     Weight 04/01/18 1500 140 lb (63.5 kg)  Height 04/01/18 1500 5\' 4"  (1.626 m)     Head Circumference --      Peak Flow --      Pain Score 04/01/18 1500 5   Constitutional: Alert and oriented.  Eyes: Conjunctivae are normal.  ENT      Head: Normocephalic and atraumatic.      Nose: No congestion/rhinnorhea.      Mouth/Throat: Mucous membranes are moist.      Neck: No stridor. Hematological/Lymphatic/Immunilogical: No cervical lymphadenopathy. Cardiovascular: Normal rate, regular rhythm.  No murmurs, rubs, or gallops.  Respiratory: Normal respiratory effort without tachypnea nor retractions. Breath sounds are clear and equal bilaterally. No  wheezes/rales/rhonchi. Gastrointestinal: Soft and non tender. No rebound. No guarding.  Genitourinary: Deferred Musculoskeletal: Normal range of motion in all extremities. No lower extremity edema. Neurologic:  Normal speech and language. No gross focal neurologic deficits are appreciated.  Skin:  Skin is warm, dry and intact. No rash noted. Psychiatric: Mood and affect are normal. Speech and behavior are normal. Patient exhibits appropriate insight and judgment.  ____________________________________________    LABS (pertinent positives/negatives)  Ethanol 366 CBC wbc 5.7, hgb 12.9, plt 177 BMP na 147, k 4.1, glu 97, cr 0.69 Trop <0.03  ____________________________________________   EKG  I, Phineas SemenGraydon Tangala Wiegert, attending physician, personally viewed and interpreted this EKG  EKG Time: 1459 Rate: 99 Rhythm: sinus rhythm Axis: left axis deviation Intervals: qtc 484 QRS: narrow ST changes: no st elevation Impression: abnormal ekg   ____________________________________________    RADIOLOGY  CXR Negative chest  ____________________________________________   PROCEDURES  Procedures  ____________________________________________   INITIAL IMPRESSION / ASSESSMENT AND PLAN / ED COURSE  Pertinent labs & imaging results that were available during my care of the patient were reviewed by me and considered in my medical decision making (see chart for details).   Patient presented to the emergency department with multiple issues.  Whenever concerns is for alcohol detox.  Discussed with patient that we do not do detox in our facility.  Will have [facility] evaluate for possible transfer to [facility].  In addition patient was complaining of chest pain.  This has been going on for a number of days.  Work-up here was negative in terms of x-ray and troponin.  EKG without concerning findings.  At this point I doubt ACS, pneumothorax, PE.  Wonder if gastritis more likely given alcohol use.  Patient  chose to leave the emergency department prior to going to [facility]. Will give [facility] follow up information.  ____________________________________________   FINAL CLINICAL IMPRESSION(S) / ED DIAGNOSES  Final diagnoses:  Nonspecific chest pain  Alcohol use     Note: This dictation was prepared with Dragon dictation. Any transcriptional errors that result from this process are unintentional     Phineas SemenGoodman, Dulcinea Kinser, MD 04/01/18 [id number]    Phineas SemenGoodman, Fay Bagg, MD 04/01/18 2020

## 2018-04-01 NOTE — ED Triage Notes (Addendum)
Pt reports chest pain for over a week, states that she has an alcohol problem and would like help to stop drinking, pt reports shaky feeling today, and her last drink was yesterday. Pt states her last dose of medications was "the other day"

## 2018-04-01 NOTE — BH Assessment (Addendum)
Ref[facility] to the following:  [facility] [address], [place] [address]                    [phone]

## 2018-04-01 NOTE — ED Notes (Signed)
Pt ambulatory to the restroom at this time with no difficulty noted.  Denies any shortness of breath or dizziness.
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
## 2018-04-01 NOTE — ED Notes (Signed)
Patient states that she does take HTN medication but that she has not taken it today.  When asked of her for the date of last use, she stated a couple days ago.

## 2018-04-01 NOTE — BH Assessment (Signed)
Per Steward DroneBrenda at [facility], referral received and is under review.

## 2018-04-01 NOTE — BH Assessment (Signed)
Per Steward DroneBrenda @ [facility], pt has been accepted and will be picked up from the ER at 2:00 am.

## 2018-04-01 NOTE — BH Assessment (Signed)
This Clinical research associatewriter contacted Dr. Derrill KayGoodman, and requested  [facility]. Dr. Derrill KayGoodman stated he will have it completed.

## 2018-04-01 NOTE — ED Notes (Signed)
Peripheral IV discontinued. Catheter intact. No signs of infiltration or redness. Gauze applied to IV site.   Pt declined [facility] services, Dr Derrill KayGoodman, and Durward Mallardamille, [facility] notified

## 2018-04-13 ENCOUNTER — Emergency Department: Payer: Self-pay

## 2018-04-13 ENCOUNTER — Other Ambulatory Visit: Payer: Self-pay

## 2018-04-13 ENCOUNTER — Emergency Department
Admission: EM | Admit: 2018-04-13 | Discharge: 2018-04-14 | Disposition: A | Discharge status: Home / Self Care | Payer: Self-pay | Attending: Emergency Medicine | Admitting: Emergency Medicine

## 2018-04-13 DIAGNOSIS — E876 Hypokalemia: Secondary | ICD-10-CM | POA: Insufficient documentation

## 2018-04-13 DIAGNOSIS — Z79899 Other long term (current) drug therapy: Secondary | ICD-10-CM | POA: Insufficient documentation

## 2018-04-13 DIAGNOSIS — W1830XA Fall on same level, unspecified, initial encounter: Secondary | ICD-10-CM | POA: Insufficient documentation

## 2018-04-13 DIAGNOSIS — Z046 Encounter for general psychiatric examination, requested by authority: Secondary | ICD-10-CM | POA: Insufficient documentation

## 2018-04-13 DIAGNOSIS — Y999 Unspecified external cause status: Secondary | ICD-10-CM | POA: Insufficient documentation

## 2018-04-13 DIAGNOSIS — F1020 Alcohol dependence, uncomplicated: Secondary | ICD-10-CM | POA: Insufficient documentation

## 2018-04-13 DIAGNOSIS — R45851 Suicidal ideations: Secondary | ICD-10-CM | POA: Insufficient documentation

## 2018-04-13 DIAGNOSIS — Y929 Unspecified place or not applicable: Secondary | ICD-10-CM | POA: Insufficient documentation

## 2018-04-13 DIAGNOSIS — F17210 Nicotine dependence, cigarettes, uncomplicated: Secondary | ICD-10-CM | POA: Insufficient documentation

## 2018-04-13 DIAGNOSIS — I10 Essential (primary) hypertension: Secondary | ICD-10-CM | POA: Insufficient documentation

## 2018-04-13 DIAGNOSIS — Y908 Blood alcohol level of 240 mg/100 ml or more: Secondary | ICD-10-CM | POA: Insufficient documentation

## 2018-04-13 DIAGNOSIS — Y939 Activity, unspecified: Secondary | ICD-10-CM | POA: Insufficient documentation

## 2018-04-13 DIAGNOSIS — W19XXXA Unspecified fall, initial encounter: Secondary | ICD-10-CM

## 2018-04-13 LAB — COMPREHENSIVE METABOLIC PANEL
ALT: 66 U/L — AB (ref 0–44)
AST: 105 U/L — ABNORMAL HIGH (ref 15–41)
Albumin: 4 g/dL (ref 3.5–5.0)
Alkaline Phosphatase: 97 U/L (ref 38–126)
Anion gap: 11 (ref 5–15)
BUN: 11 mg/dL (ref 6–20)
CHLORIDE: 108 mmol/L (ref 98–111)
CO2: 28 mmol/L (ref 22–32)
CREATININE: 0.84 mg/dL (ref 0.44–1.00)
Calcium: 8.6 mg/dL — ABNORMAL LOW (ref 8.9–10.3)
Glucose, Bld: 86 mg/dL (ref 70–99)
Potassium: 2.9 mmol/L — ABNORMAL LOW (ref 3.5–5.1)
Sodium: 147 mmol/L — ABNORMAL HIGH (ref 135–145)
Total Bilirubin: 0.8 mg/dL (ref 0.3–1.2)
Total Protein: 8.3 g/dL — ABNORMAL HIGH (ref 6.5–8.1)

## 2018-04-13 LAB — ACETAMINOPHEN LEVEL: Acetaminophen (Tylenol), Serum: <10 ug/mL — ABNORMAL LOW (ref 10–30)

## 2018-04-13 LAB — CBC WITH DIFFERENTIAL/PLATELET
Basophils Absolute: 0.1 10*3/uL (ref 0–0.1)
Basophils Relative: 2 %
EOS PCT: 3 %
Eosinophils Absolute: 0.2 10*3/uL (ref 0–0.7)
HCT: 39.4 % (ref 35.0–47.0)
Hemoglobin: 13.3 g/dL (ref 12.0–16.0)
LYMPHS PCT: 40 %
Lymphs Abs: 2.2 10*3/uL (ref 1.0–3.6)
MCH: 33.7 pg (ref 26.0–34.0)
MCHC: 33.6 g/dL (ref 32.0–36.0)
MCV: 100.1 fL — AB (ref 80.0–100.0)
MONO ABS: 0.6 10*3/uL (ref 0.2–0.9)
MONOS PCT: 11 %
Neutro Abs: 2.5 10*3/uL (ref 1.4–6.5)
Neutrophils Relative %: 44 %
PLATELETS: 176 10*3/uL (ref 150–440)
RBC: 3.93 MIL/uL (ref 3.80–5.20)
RDW: 18.6 % — ABNORMAL HIGH (ref 11.5–14.5)
WBC: 5.5 10*3/uL (ref 3.6–11.0)

## 2018-04-13 LAB — SALICYLATE LEVEL

## 2018-04-13 LAB — ETHANOL: ALCOHOL ETHYL (B): 266 mg/dL — AB (ref <10)

## 2018-04-13 MED ORDER — LORAZEPAM 2 MG/ML IJ SOLN: 0.0000 mg | Freq: Two times a day (BID) | INTRAMUSCULAR | Status: DC

## 2018-04-13 MED ORDER — SODIUM CHLORIDE 0.9 % IV BOLUS
1000.0000 mL | Freq: Once | INTRAVENOUS | Status: AC
  Administered 2018-04-13: 1000 mL via INTRAVENOUS

## 2018-04-13 MED ORDER — LORAZEPAM 2 MG PO TABS: 0.0000 mg | ORAL_TABLET | Freq: Four times a day (QID) | ORAL | Status: DC

## 2018-04-13 MED ORDER — LORAZEPAM 2 MG PO TABS: 0.0000 mg | ORAL_TABLET | Freq: Two times a day (BID) | ORAL | Status: DC

## 2018-04-13 MED ORDER — THIAMINE HCL 100 MG/ML IJ SOLN: 100.0000 mg | Freq: Every day | INTRAMUSCULAR | Status: DC

## 2018-04-13 MED ORDER — LORAZEPAM 2 MG/ML IJ SOLN: 0.0000 mg | Freq: Four times a day (QID) | INTRAMUSCULAR | Status: DC

## 2018-04-13 MED ORDER — VITAMIN B-1 100 MG PO TABS: 100.0000 mg | ORAL_TABLET | Freq: Every day | ORAL | Status: DC

## 2018-04-13 NOTE — ED Notes (Signed)
SoC machine in room.

## 2018-04-13 NOTE — ED Notes (Signed)
Redraw collected, red top

## 2018-04-13 NOTE — ED Notes (Signed)
[facility].

## 2018-04-13 NOTE — ED Triage Notes (Signed)
Pt to the er for fall and swelling to the back of her head on the right side. Pt fell last night and hit her head while she was drinking. Pt also has a lac to the bottom of the chin. Pt has had no alcohol today.

## 2018-04-13 NOTE — ED Notes (Signed)
Lab informed blood sent. Hold labels on green and purple top due to printer malfunction.

## 2018-04-13 NOTE — ED Notes (Signed)
Patient transported from ER room 1 to Room 21 by this EDT.

## 2018-04-13 NOTE — ED Provider Notes (Signed)
[facility] Emergency Department Provider Note ____________________________________________   First MD Initiated Contact with Patient 04/13/18 2112     (approximate)  I have reviewed the triage vital signs and the nursing notes.   HISTORY  Chief Complaint Fall  HPI Carla Dickson is a 47 y.o. female with a history of alcoholism who was presented to the emergency department for fall yesterday.  The right parieto-occipital region as well as the right superior cervical spine.  Patient states that she is afraid that she is going to kill herself by using her alcohol.  Denies any weakness or numbness at this time.  Says that she fell because of stumbling while drunk.  Says that she will drink alcohol and liquor in any amount she can get her hands on every day.  Past Medical History:  Diagnosis Date  . Fracture of fifth metacarpal bone of left hand with routine healing   . Hypercholesteremia   . Hypertension   . Reflux     There are no active problems to display for this patient.   Past Surgical History:  Procedure Laterality Date  . ABDOMINAL HYSTERECTOMY      Prior to Admission medications   Medication Sig Start Date End Date Taking? Authorizing Provider  atorvastatin (LIPITOR) 40 MG tablet Take 40 mg by mouth daily.    [provider]  cloNIDine (CATAPRES) 0.1 MG tablet Take 0.1 mg by mouth 2 (two) times daily.    [provider]    Allergies Patient has no known allergies.  No family history on file.  Social History Social History   Tobacco Use  . Smoking status: Current Every Day Smoker    Packs/day: 0.50    Types: Cigarettes  . Smokeless tobacco: Never Used  Substance Use Topics  . Alcohol use: Yes    Alcohol/week: 15.6 oz    Types: 24 Shots of liquor, 2 Cans of beer per week    Comment: 2-3 drinks liquor per day  . Drug use: No    Review of Systems  Constitutional: No fever/chills Eyes: No visual  changes. ENT: No sore throat. Cardiovascular: Denies chest pain. Respiratory: Denies shortness of breath. Gastrointestinal: No abdominal pain.  No nausea, no vomiting.  No diarrhea.  No constipation. Genitourinary: Negative for dysuria. Musculoskeletal: Negative for back pain. Skin: Negative for rash. Neurological: Negative for focal weakness or numbness.   ____________________________________________   PHYSICAL EXAM:  VITAL SIGNS: ED Triage Vitals  Enc Vitals Group     BP 04/13/18 2058 (!) 177/104     Pulse Rate 04/13/18 2058 (!) 106     Resp 04/13/18 2058 18     Temp 04/13/18 2058 98.8 F (37.1 C)     Temp Source 04/13/18 2058 Oral     SpO2 04/13/18 2058 98 %     Weight 04/13/18 2100 120 lb (54.4 kg)     Height 04/13/18 2100 5\' 4"  (1.626 m)     Head Circumference --      Peak Flow --      Pain Score 04/13/18 2100 8     Pain Loc --      Pain Edu? --      Excl. in GC? --     Constitutional: Alert and oriented. Well appearing and in no acute distress. Eyes: Conjunctivae are normal.  Head: Atraumatic. Nose: No congestion/rhinnorhea. Mouth/Throat: Mucous membranes are moist.  Neck: No stridor.  Tenderness to palpation.  Moves head neck freely. Cardiovascular: Normal  rate, regular rhythm. Grossly normal heart sounds. Respiratory: Normal respiratory effort.  No retractions. Lungs CTAB. Gastrointestinal: Soft and nontender. No distention.  Musculoskeletal: No lower extremity tenderness nor edema.  No joint effusions. Neurologic:  Normal speech and language. No gross focal neurologic deficits are appreciated. Skin:  Skin is warm, dry and intact. No rash noted. Psychiatric: Mood and affect are normal. Speech and behavior are normal.  ____________________________________________   LABS (all labs ordered are listed, but only abnormal results are displayed)  Labs Reviewed  CBC WITH DIFFERENTIAL/PLATELET  COMPREHENSIVE METABOLIC PANEL  ETHANOL  ACETAMINOPHEN LEVEL   SALICYLATE LEVEL  URINALYSIS, COMPLETE (UACMP) WITH MICROSCOPIC  URINE DRUG SCREEN, QUALITATIVE ([facility] ONLY)  POC URINE PREG, ED   ____________________________________________  EKG   ____________________________________________  RADIOLOGY  No acute finding on the CT head neck. ____________________________________________   PROCEDURES  Procedure(s) performed:   Procedures  Critical Care performed:   ____________________________________________   INITIAL IMPRESSION / ASSESSMENT AND PLAN / ED COURSE  Pertinent labs & imaging results that were available during my care of the patient were reviewed by me and considered in my medical decision making (see chart for details).  DDX: Alcohol intoxication, alcoholism, skull fracture, intercranial hemorrhage, cervical spine fracture, suicidal ideation As part of my medical decision making, I reviewed the following data within the electronic MEDICAL RECORD NUMBER Notes from prior ED visits  Patient giving vague answers about whether she is actually intending to kill herself or not or if she is just afraid she is going to kill himself because of her alcoholism.  We will complete an IVC.  Tele-psych to be consulted.  Patient understand this plan and willing to comply. ____________________________________________   FINAL CLINICAL IMPRESSION(S) / ED DIAGNOSES  Alcoholism.  Fall.  Suicidal ideation.   NEW MEDICATIONS STARTED DURING THIS VISIT:  New Prescriptions   No medications on file     Note:  This document was prepared using Dragon voice recognition software and may include unintentional dictation errors.     Myrna Blazer, MD 04/13/18 [phone]

## 2018-04-13 NOTE — ED Notes (Signed)
Patient dressed out into behavior purple scrubs by this EDT. Patient items secured into patient belongings bag. Items include flip flops, black bra, teal shirt, underwear, jeans,amercian bandana, black scrunchie, purse w/ valuables and a black belt.

## 2018-04-13 NOTE — ED Notes (Signed)
Patient transported to CT 

## 2018-04-14 LAB — URINE DRUG SCREEN, QUALITATIVE (ARMC ONLY)
Amphetamines, Ur Screen: NOT DETECTED
Benzodiazepine, Ur Scrn: NOT DETECTED
CANNABINOID 50 NG, UR ~~LOC~~: NOT DETECTED
COCAINE METABOLITE, UR ~~LOC~~: NOT DETECTED
MDMA (Ecstasy)Ur Screen: NOT DETECTED
METHADONE SCREEN, URINE: NOT DETECTED
OPIATE, UR SCREEN: NOT DETECTED
PHENCYCLIDINE (PCP) UR S: NOT DETECTED
Tricyclic, Ur Screen: NOT DETECTED

## 2018-04-14 LAB — URINALYSIS, COMPLETE (UACMP) WITH MICROSCOPIC
BILIRUBIN URINE: NEGATIVE
GLUCOSE, UA: NEGATIVE mg/dL
KETONES UR: NEGATIVE mg/dL
LEUKOCYTES UA: NEGATIVE
NITRITE: NEGATIVE
PH: 6 (ref 5.0–8.0)
Protein, ur: NEGATIVE mg/dL
SPECIFIC GRAVITY, URINE: 1.01 (ref 1.005–1.030)

## 2018-04-14 MED ORDER — POTASSIUM CHLORIDE CRYS ER 20 MEQ PO TBCR: 20.0000 meq | EXTENDED_RELEASE_TABLET | Freq: Every day | ORAL | 0 refills | Status: DC

## 2018-04-14 MED ORDER — POTASSIUM CHLORIDE CRYS ER 20 MEQ PO TBCR
40.0000 meq | EXTENDED_RELEASE_TABLET | Freq: Once | ORAL | Status: AC
  Administered 2018-04-14: 40 meq via ORAL
  Filled 2018-04-14: qty 2

## 2018-04-14 NOTE — Discharge Instructions (Signed)
You have been seen in the Emergency Department (ED) today for a psychiatric complaint including alcohol abuse.  You have been evaluated by psychiatry and we believe you are safe to be discharged from the hospital.    Please return to the ED immediately if you have ANY thoughts of hurting yourself or anyone else, so that we may help you.  Please make use of the outpatient resources provided to help with your substance abuse.  Follow up with your doctor and/or therapist as soon as possible regarding today's ED visit.   Please follow up any other recommendations and clinic appointments provided by the psychiatry team that saw you in the Emergency Department.

## 2018-04-14 NOTE — ED Notes (Signed)
[facility] psychiatrist called for report.

## 2018-04-14 NOTE — ED Provider Notes (Signed)
-----------------------------------------   1:13 AM on 04/14/2018 -----------------------------------------   Blood pressure (!) 168/104, pulse 98, temperature 98.7 F (37.1 C), temperature source Oral, resp. rate 19, height 1.626 m (5\' 4" ), weight 54.4 kg (120 lb), SpO2 100 %.  I spoke by phone with the tele-psychiatrist and read his written report.  He does not feel she represents a danger to herself or others.  He released her from involuntary commitment.  She is ambulatory without any difficulty, not slurring speech, and states she is ready to go home by taxi.  I think that is appropriate under the circumstances.  I gave a dose of 40 mEq of potassium by mouth and a prescription for additional potassium supplementation and provided extensive outpatient resources for her substance abuse.  I gave my usual and customary return precautions.    Loleta RoseForbach, Ivannah Zody, MD 04/14/18 [phone]

## 2018-04-14 NOTE — ED Notes (Signed)
Cab called for patient at this time.  Patient ambulatory to lobby without difficulty.

## 2018-04-18 ENCOUNTER — Ambulatory Visit: Payer: Self-pay | Admitting: Family Medicine

## 2019-01-28 ENCOUNTER — Emergency Department: Payer: Self-pay

## 2019-01-28 ENCOUNTER — Encounter: Payer: Self-pay | Admitting: Emergency Medicine

## 2019-01-28 ENCOUNTER — Other Ambulatory Visit: Payer: Self-pay

## 2019-01-28 ENCOUNTER — Inpatient Hospital Stay
Admission: EM | Admit: 2019-01-28 | Discharge: 2019-02-01 | DRG: 894 | Discharge status: Against Medical Advice | Payer: Self-pay | Attending: Internal Medicine | Admitting: Internal Medicine

## 2019-01-28 ENCOUNTER — Inpatient Hospital Stay: Payer: Self-pay

## 2019-01-28 DIAGNOSIS — E871 Hypo-osmolality and hyponatremia: Secondary | ICD-10-CM | POA: Diagnosis present

## 2019-01-28 DIAGNOSIS — K760 Fatty (change of) liver, not elsewhere classified: Secondary | ICD-10-CM | POA: Diagnosis present

## 2019-01-28 DIAGNOSIS — S0512XA Contusion of eyeball and orbital tissues, left eye, initial encounter: Secondary | ICD-10-CM | POA: Diagnosis present

## 2019-01-28 DIAGNOSIS — F10239 Alcohol dependence with withdrawal, unspecified: Principal | ICD-10-CM | POA: Diagnosis present

## 2019-01-28 DIAGNOSIS — E876 Hypokalemia: Secondary | ICD-10-CM | POA: Diagnosis present

## 2019-01-28 DIAGNOSIS — K219 Gastro-esophageal reflux disease without esophagitis: Secondary | ICD-10-CM | POA: Diagnosis present

## 2019-01-28 DIAGNOSIS — Y92009 Unspecified place in unspecified non-institutional (private) residence as the place of occurrence of the external cause: Secondary | ICD-10-CM

## 2019-01-28 DIAGNOSIS — Z803 Family history of malignant neoplasm of breast: Secondary | ICD-10-CM

## 2019-01-28 DIAGNOSIS — S0990XA Unspecified injury of head, initial encounter: Secondary | ICD-10-CM

## 2019-01-28 DIAGNOSIS — I10 Essential (primary) hypertension: Secondary | ICD-10-CM | POA: Diagnosis present

## 2019-01-28 DIAGNOSIS — Z1159 Encounter for screening for other viral diseases: Secondary | ICD-10-CM

## 2019-01-28 DIAGNOSIS — Z79899 Other long term (current) drug therapy: Secondary | ICD-10-CM

## 2019-01-28 DIAGNOSIS — R000 Tachycardia, unspecified: Secondary | ICD-10-CM | POA: Diagnosis present

## 2019-01-28 DIAGNOSIS — R6510 Systemic inflammatory response syndrome (SIRS) of non-infectious origin without acute organ dysfunction: Secondary | ICD-10-CM | POA: Diagnosis present

## 2019-01-28 DIAGNOSIS — F17210 Nicotine dependence, cigarettes, uncomplicated: Secondary | ICD-10-CM | POA: Diagnosis present

## 2019-01-28 DIAGNOSIS — Z789 Other specified health status: Secondary | ICD-10-CM

## 2019-01-28 DIAGNOSIS — S0083XA Contusion of other part of head, initial encounter: Secondary | ICD-10-CM

## 2019-01-28 DIAGNOSIS — E785 Hyperlipidemia, unspecified: Secondary | ICD-10-CM | POA: Diagnosis present

## 2019-01-28 DIAGNOSIS — A419 Sepsis, unspecified organism: Secondary | ICD-10-CM

## 2019-01-28 DIAGNOSIS — D696 Thrombocytopenia, unspecified: Secondary | ICD-10-CM | POA: Diagnosis present

## 2019-01-28 DIAGNOSIS — Z7289 Other problems related to lifestyle: Secondary | ICD-10-CM

## 2019-01-28 DIAGNOSIS — W19XXXA Unspecified fall, initial encounter: Secondary | ICD-10-CM | POA: Diagnosis present

## 2019-01-28 DIAGNOSIS — K7460 Unspecified cirrhosis of liver: Secondary | ICD-10-CM

## 2019-01-28 DIAGNOSIS — Z90710 Acquired absence of both cervix and uterus: Secondary | ICD-10-CM

## 2019-01-28 HISTORY — DX: Other specified health status: Z78.9

## 2019-01-28 HISTORY — DX: Other problems related to lifestyle: Z72.89

## 2019-01-28 HISTORY — DX: Alcohol use, unspecified, uncomplicated: F10.90

## 2019-01-28 LAB — CBC WITH DIFFERENTIAL/PLATELET
Abs Immature Granulocytes: 0.01 10*3/uL (ref 0.00–0.07)
Basophils Absolute: 0.1 10*3/uL (ref 0.0–0.1)
Basophils Relative: 1 %
Eosinophils Absolute: 0.1 10*3/uL (ref 0.0–0.5)
Eosinophils Relative: 1 %
HCT: 40.9 % (ref 36.0–46.0)
Hemoglobin: 14.1 g/dL (ref 12.0–15.0)
Immature Granulocytes: 0 %
Lymphocytes Relative: 20 %
Lymphs Abs: 1.7 10*3/uL (ref 0.7–4.0)
MCH: 30.7 pg (ref 26.0–34.0)
MCHC: 34.5 g/dL (ref 30.0–36.0)
MCV: 89.1 fL (ref 80.0–100.0)
Monocytes Absolute: 0.4 10*3/uL (ref 0.1–1.0)
Monocytes Relative: 5 %
Neutro Abs: 6.1 10*3/uL (ref 1.7–7.7)
Neutrophils Relative %: 73 %
Platelets: 73 10*3/uL — ABNORMAL LOW (ref 150–400)
RBC: 4.59 MIL/uL (ref 3.87–5.11)
RDW: 15.9 % — ABNORMAL HIGH (ref 11.5–15.5)
WBC: 8.4 10*3/uL (ref 4.0–10.5)
nRBC: 0 % (ref 0.0–0.2)

## 2019-01-28 LAB — URINALYSIS, COMPLETE (UACMP) WITH MICROSCOPIC
Bilirubin Urine: NEGATIVE
Glucose, UA: NEGATIVE mg/dL
Ketones, ur: 5 mg/dL — AB
Leukocytes,Ua: NEGATIVE
Nitrite: NEGATIVE
Protein, ur: 30 mg/dL — AB
Specific Gravity, Urine: 1.015 (ref 1.005–1.030)
pH: 7 (ref 5.0–8.0)

## 2019-01-28 LAB — URINE DRUG SCREEN, QUALITATIVE (ARMC ONLY)
Amphetamines, Ur Screen: NOT DETECTED
Barbiturates, Ur Screen: NOT DETECTED
Benzodiazepine, Ur Scrn: NOT DETECTED
Cannabinoid 50 Ng, Ur ~~LOC~~: NOT DETECTED
Cocaine Metabolite,Ur ~~LOC~~: NOT DETECTED
MDMA (Ecstasy)Ur Screen: NOT DETECTED
Methadone Scn, Ur: NOT DETECTED
Opiate, Ur Screen: NOT DETECTED
Phencyclidine (PCP) Ur S: NOT DETECTED
Tricyclic, Ur Screen: NOT DETECTED

## 2019-01-28 LAB — COMPREHENSIVE METABOLIC PANEL
ALT: 96 U/L — ABNORMAL HIGH (ref 0–44)
AST: 211 U/L — ABNORMAL HIGH (ref 15–41)
Albumin: 4.7 g/dL (ref 3.5–5.0)
Alkaline Phosphatase: 133 U/L — ABNORMAL HIGH (ref 38–126)
Anion gap: 20 — ABNORMAL HIGH (ref 5–15)
BUN: 13 mg/dL (ref 6–20)
CO2: 24 mmol/L (ref 22–32)
Calcium: 9.7 mg/dL (ref 8.9–10.3)
Chloride: 92 mmol/L — ABNORMAL LOW (ref 98–111)
Creatinine, Ser: 0.78 mg/dL (ref 0.44–1.00)
GFR calc Af Amer: >60 mL/min (ref >60)
GFR calc non Af Amer: >60 mL/min (ref >60)
Glucose, Bld: 185 mg/dL — ABNORMAL HIGH (ref 70–99)
Potassium: 3.3 mmol/L — ABNORMAL LOW (ref 3.5–5.1)
Sodium: 136 mmol/L (ref 135–145)
Total Bilirubin: 1.9 mg/dL — ABNORMAL HIGH (ref 0.3–1.2)
Total Protein: 9.3 g/dL — ABNORMAL HIGH (ref 6.5–8.1)

## 2019-01-28 LAB — CBC
HCT: 40.4 % (ref 36.0–46.0)
Hemoglobin: 14 g/dL (ref 12.0–15.0)
MCH: 30.6 pg (ref 26.0–34.0)
MCHC: 34.7 g/dL (ref 30.0–36.0)
MCV: 88.2 fL (ref 80.0–100.0)
Platelets: 71 10*3/uL — ABNORMAL LOW (ref 150–400)
RBC: 4.58 MIL/uL (ref 3.87–5.11)
RDW: 15.5 % (ref 11.5–15.5)
WBC: 7.9 10*3/uL (ref 4.0–10.5)
nRBC: 0 % (ref 0.0–0.2)

## 2019-01-28 LAB — TROPONIN I: Troponin I: <0.03 ng/mL (ref <0.03)

## 2019-01-28 LAB — LIPASE, BLOOD: Lipase: 47 U/L (ref 11–51)

## 2019-01-28 LAB — PROTIME-INR
INR: 0.9 (ref 0.8–1.2)
Prothrombin Time: 11.8 seconds (ref 11.4–15.2)

## 2019-01-28 LAB — ETHANOL: Alcohol, Ethyl (B): <10 mg/dL (ref <10)

## 2019-01-28 LAB — LACTIC ACID, PLASMA: Lactic Acid, Venous: 2.3 mmol/L (ref 0.5–1.9)

## 2019-01-28 LAB — APTT: aPTT: 30 seconds (ref 24–36)

## 2019-01-28 LAB — MAGNESIUM: Magnesium: 1.5 mg/dL — ABNORMAL LOW (ref 1.7–2.4)

## 2019-01-28 LAB — PROCALCITONIN: Procalcitonin: 0.1 ng/mL

## 2019-01-28 LAB — SARS CORONAVIRUS 2 BY RT PCR (HOSPITAL ORDER, PERFORMED IN ~~LOC~~ HOSPITAL LAB): SARS Coronavirus 2: NEGATIVE

## 2019-01-28 MED ORDER — LORAZEPAM 2 MG/ML IJ SOLN
0.0000 mg | Freq: Two times a day (BID) | INTRAMUSCULAR | Status: DC
  Administered 2019-01-31: 1 mg via INTRAVENOUS
  Filled 2019-01-28 (×2): qty 1

## 2019-01-28 MED ORDER — SODIUM CHLORIDE 0.9 % IV BOLUS
1000.0000 mL | Freq: Once | INTRAVENOUS | Status: AC
  Administered 2019-01-28: 1000 mL via INTRAVENOUS

## 2019-01-28 MED ORDER — VITAMIN B-1 100 MG PO TABS
100.0000 mg | ORAL_TABLET | Freq: Every day | ORAL | Status: DC
  Administered 2019-01-29 – 2019-02-01 (×4): 100 mg via ORAL
  Filled 2019-01-28 (×4): qty 1

## 2019-01-28 MED ORDER — SODIUM CHLORIDE 0.9 % IV SOLN
INTRAVENOUS | Status: AC
  Administered 2019-01-28: 23:00:00 via INTRAVENOUS

## 2019-01-28 MED ORDER — ONDANSETRON HCL 4 MG/2ML IJ SOLN
4.0000 mg | Freq: Once | INTRAMUSCULAR | Status: AC
  Administered 2019-01-28: 4 mg via INTRAVENOUS
  Filled 2019-01-28: qty 2

## 2019-01-28 MED ORDER — ONDANSETRON HCL 4 MG/2ML IJ SOLN
4.0000 mg | Freq: Once | INTRAMUSCULAR | Status: AC | PRN
  Administered 2019-01-28: 4 mg via INTRAVENOUS
  Filled 2019-01-28: qty 2

## 2019-01-28 MED ORDER — PIPERACILLIN-TAZOBACTAM 3.375 G IVPB 30 MIN
3.3750 g | Freq: Once | INTRAVENOUS | Status: AC
  Administered 2019-01-28: 3.375 g via INTRAVENOUS
  Filled 2019-01-28: qty 50

## 2019-01-28 MED ORDER — METOCLOPRAMIDE HCL 5 MG/ML IJ SOLN
10.0000 mg | Freq: Once | INTRAMUSCULAR | Status: AC
  Administered 2019-01-28: 10 mg via INTRAVENOUS
  Filled 2019-01-28: qty 2

## 2019-01-28 MED ORDER — IBUPROFEN 400 MG PO TABS
400.0000 mg | ORAL_TABLET | Freq: Four times a day (QID) | ORAL | Status: DC | PRN
  Administered 2019-01-29 – 2019-02-01 (×2): 400 mg via ORAL
  Filled 2019-01-28 (×2): qty 1

## 2019-01-28 MED ORDER — METOCLOPRAMIDE HCL 5 MG PO TABS: 10.0000 mg | ORAL_TABLET | Freq: Three times a day (TID) | ORAL | 0 refills | Status: DC | PRN

## 2019-01-28 MED ORDER — LORAZEPAM 2 MG/ML IJ SOLN
0.0000 mg | Freq: Four times a day (QID) | INTRAMUSCULAR | Status: AC
  Administered 2019-01-29: 1 mg via INTRAVENOUS
  Administered 2019-01-30 (×3): 2 mg via INTRAVENOUS
  Filled 2019-01-28 (×4): qty 1

## 2019-01-28 MED ORDER — ACETAMINOPHEN 500 MG PO TABS
1000.0000 mg | ORAL_TABLET | Freq: Once | ORAL | Status: AC
  Administered 2019-01-28: 1000 mg via ORAL
  Filled 2019-01-28: qty 2

## 2019-01-28 MED ORDER — PANTOPRAZOLE SODIUM 40 MG PO TBEC
40.0000 mg | DELAYED_RELEASE_TABLET | Freq: Every day | ORAL | Status: DC
  Administered 2019-01-29 – 2019-02-01 (×4): 40 mg via ORAL
  Filled 2019-01-28 (×4): qty 1

## 2019-01-28 MED ORDER — LORAZEPAM 2 MG/ML IJ SOLN
1.0000 mg | Freq: Four times a day (QID) | INTRAMUSCULAR | Status: AC | PRN
  Administered 2019-01-30 (×2): 1 mg via INTRAVENOUS
  Administered 2019-01-31: 2 mg via INTRAVENOUS
  Filled 2019-01-28 (×2): qty 1

## 2019-01-28 MED ORDER — ONDANSETRON HCL 4 MG/2ML IJ SOLN: 4.0000 mg | Freq: Four times a day (QID) | INTRAMUSCULAR | Status: DC | PRN

## 2019-01-28 MED ORDER — LORAZEPAM 1 MG PO TABS: 1.0000 mg | ORAL_TABLET | Freq: Four times a day (QID) | ORAL | Status: AC | PRN

## 2019-01-28 MED ORDER — MAGNESIUM SULFATE 4 GM/100ML IV SOLN
4.0000 g | Freq: Once | INTRAVENOUS | Status: AC
  Administered 2019-01-29: 4 g via INTRAVENOUS
  Filled 2019-01-28: qty 100

## 2019-01-28 MED ORDER — THIAMINE HCL 100 MG/ML IJ SOLN: 100.0000 mg | Freq: Every day | INTRAMUSCULAR | Status: DC

## 2019-01-28 MED ORDER — ONDANSETRON HCL 4 MG PO TABS: 4.0000 mg | ORAL_TABLET | Freq: Four times a day (QID) | ORAL | Status: DC | PRN

## 2019-01-28 MED ORDER — FOLIC ACID 1 MG PO TABS
1.0000 mg | ORAL_TABLET | Freq: Every day | ORAL | Status: DC
  Administered 2019-01-29 – 2019-02-01 (×4): 1 mg via ORAL
  Filled 2019-01-28 (×4): qty 1

## 2019-01-28 MED ORDER — POTASSIUM CHLORIDE CRYS ER 20 MEQ PO TBCR
20.0000 meq | EXTENDED_RELEASE_TABLET | Freq: Once | ORAL | Status: AC
  Administered 2019-01-28: 20 meq via ORAL
  Filled 2019-01-28: qty 1

## 2019-01-28 MED ORDER — ADULT MULTIVITAMIN W/MINERALS CH
1.0000 | ORAL_TABLET | Freq: Every day | ORAL | Status: DC
  Administered 2019-01-29 – 2019-02-01 (×4): 1 via ORAL
  Filled 2019-01-28 (×4): qty 1

## 2019-01-28 MED ORDER — METOCLOPRAMIDE HCL 10 MG PO TABS: 10.0000 mg | ORAL_TABLET | Freq: Once | ORAL | Status: DC

## 2019-01-28 NOTE — ED Triage Notes (Addendum)
PT arrives post fall. Pt does not remember fall. EMS reports a blood glucose of 163, BP 151/98, HR 152. Upon arrival to ED; pt has episode of emesis. Pt reports a 7/10 headache. Swelling observed to pt's left eye. Pt reports she remembers feeling dizzy prior to fall. Pt alert to person, place, and situation. Pt unsure of year; unknown baseline. No other deficits observed.

## 2019-01-28 NOTE — ED Provider Notes (Signed)
[facility] Emergency Department Provider Note ____________________________________________  Time seen: 1335  I have reviewed the triage vital signs and the nursing notes.  HISTORY  Chief Complaint  Fall  HPI Carla Dickson is a 48 y.o. female presents to the ED via EMS from home, following an unwitnessed fall; that the patient says occurred yesterday.  Patient reports she does not remember the fall while getting out of bed. She claims to have been sleeping all day, and that is why her husband called EMS. She reports headache pain at a 7 out of 10 at this time.  Patient admits to feeling dizzy prior to the fall.  According to EMS patient had a blood glucose of 163, a BP of 151/98, and a heart rate of 152.  Patient's medical history is consistent with hypertension, alcohol intoxication, and reflux.  Past Medical History:  Diagnosis Date  . Fracture of fifth metacarpal bone of left hand with routine healing   . Hypercholesteremia   . Hypertension   . Reflux     There are no active problems to display for this patient.   Past Surgical History:  Procedure Laterality Date  . ABDOMINAL HYSTERECTOMY      Prior to Admission medications   Medication Sig Start Date End Date Taking? Authorizing Provider  omeprazole (PRILOSEC) 20 MG capsule Take 20 mg by mouth daily.   Yes [provider]  metoCLOPramide (REGLAN) 5 MG tablet Take 2 tablets (10 mg total) by mouth every 8 (eight) hours as needed for nausea or vomiting. 01/28/19   Edward Guthmiller, Charlesetta Ivory, PA-C    Allergies Patient has no known allergies.  No family history on file.  Social History Social History   Tobacco Use  . Smoking status: Current Every Day Smoker    Packs/day: 0.50    Types: Cigarettes  . Smokeless tobacco: Never Used  Substance Use Topics  . Alcohol use: Yes    Alcohol/week: 26.0 standard drinks    Types: 24 Shots of liquor, 2 Cans of beer per week    Comment: 2-3 drinks  liquor per day  . Drug use: No    Review of Systems  Constitutional: Negative for fever. Eyes: Negative for visual changes. ENT: Negative for sore throat. Cardiovascular: Negative for chest pain. Respiratory: Negative for shortness of breath. Gastrointestinal: Negative for abdominal pain and diarrhea. Reports nausea Genitourinary: Negative for dysuria. Musculoskeletal: Negative for back pain. Skin: Negative for rash. Neurological: Positive for headaches. Denies focal weakness or numbness. ____________________________________________  PHYSICAL EXAM:  VITAL SIGNS: ED Triage Vitals  Enc Vitals Group     BP 01/28/19 1222 (!) 152/103     Pulse Rate 01/28/19 1222 (!) 142     Resp 01/28/19 1222 20     Temp 01/28/19 1222 98.1 F (36.7 C)     Temp Source 01/28/19 1222 Oral     SpO2 01/28/19 1222 98 %     Weight 01/28/19 1216 130 lb (59 kg)     Height 01/28/19 1216  (1.702 m)     Head Circumference --      Peak Flow --      Pain Score 01/28/19 1214 0     Pain Loc --      Pain Edu? --      Excl. in GC? --     Constitutional: Alert and oriented. Well appearing and in no distress. GCS=15 Head: Normocephalic and atraumatic. Eyes: Conjunctivae are with a subconjunctival hemorrhage on the  left. PERRL. Normal extraocular movements without signs of entrapment. Significant left periorbital edema and ecchymosis.  Ears: Canals clear. TMs intact bilaterally. No hemotympanum Nose: No congestion/rhinorrhea/epistaxis. Mouth/Throat: Mucous membranes are moist. No oral lesions.  Neck: Supple. No midline tenderness. Normal ROM Cardiovascular: Normal rate, regular rhythm. Normal distal pulses. Respiratory: Normal respiratory effort. No wheezes/rales/rhonchi. Gastrointestinal: Soft and nontender. No distention. No rebound or rigidity. Intermittent emesis.  Musculoskeletal: Nontender with normal range of motion in all extremities.  Neurologic:  Normal gait without ataxia. Normal speech and  language. No gross focal neurologic deficits are appreciated. Skin:  Skin is warm, dry and intact. No rash noted. Psychiatric: Mood and affect are normal. Patient exhibits appropriate insight and judgment. ____________________________________________   LABS (pertinent positives/negatives) Labs Reviewed  COMPREHENSIVE METABOLIC PANEL - Abnormal; Notable for the following components:      Result Value   Potassium 3.3 (*)    Chloride 92 (*)    Glucose, Bld 185 (*)    Total Protein 9.3 (*)    AST 211 (*)    ALT 96 (*)    Alkaline Phosphatase 133 (*)    Total Bilirubin 1.9 (*)    Anion gap 20 (*)    All other components within normal limits  CBC - Abnormal; Notable for the following components:   Platelets 71 (*)    All other components within normal limits  MAGNESIUM - Abnormal; Notable for the following components:   Magnesium 1.5 (*)    All other components within normal limits  SARS CORONAVIRUS 2 (HOSPITAL ORDER, PERFORMED IN [facility])  CULTURE, BLOOD (ROUTINE X 2)  CULTURE, BLOOD (ROUTINE X 2)  LIPASE, BLOOD  TROPONIN I  PROTIME-INR  APTT  ETHANOL  URINALYSIS, COMPLETE (UACMP) WITH MICROSCOPIC  LACTIC ACID, PLASMA  LACTIC ACID, PLASMA  ____________________________________________  EKG  Sinus Tachycardia 122 bpm Biatrial enlargement Left axis deviation No STEMI ____________________________________________   RADIOLOGY  CT Head & & Maxillofacial & Cervical Spine w/o CM  IMPRESSION: 1. No acute intracranial pathology. 2. No acute osseous injury of the maxillofacial bones. Left infraorbital hemorrhagic contusion with surrounding soft tissue edema and swelling. 3.  No acute osseous injury of the cervical spine. ____________________________________________  PROCEDURES  Procedures Zofran 4 mg IVP x 2 Reglan 10 mg IVP NS 1000 ml bolus x 2 K-dur 20 meq PO Tylenol 1000 mg PO Zosyn 3.375 mg  IVPB ____________________________________________  INITIAL IMPRESSION / ASSESSMENT AND PLAN / ED COURSE  Carla Dickson was evaluated in Emergency Department on 01/28/2019 for the symptoms described in the history of present illness. She was evaluated in the context of the global COVID-19 pandemic, which necessitated consideration that the patient might be at risk for infection with the SARS-CoV-2 virus that causes COVID-19. Institutional protocols and algorithms that pertain to the evaluation of patients at risk for COVID-19 are in a state of rapid change based on information released by regulatory bodies including the CDC and federal and state organizations. These policies and algorithms were followed during the patient's care in the ED.  Patient with ED evaluation of injuries sustained following a fall. During her time in the ED, she has continued to have tremors, intermittent nausea & vomiting, despite anti-emetics and fluid hydration. Once stable and queued for discharge, she was found to be febrile and tachy. Additional work-up for sepsis was then initiated.   ----------------------------------------- 7:34 PM on 01/28/2019 -----------------------------------------  I will contact the hospitalist for admission and management of sepsis criteria. Dr.  Enid BaasOjie will evaluate the patient for admission.   ----------------------------------------- 7:39 PM on 01/28/2019 -----------------------------------------  Spoke with husband, Carla HuaDavid [phone]) to notify him of the admission plans. Her reports that she got up this morning and showered as usual. He heard her apparently fall in the bedroom. He found her on the floor and that's when he called EMS.  ____________________________________________  FINAL CLINICAL IMPRESSION(S) / ED DIAGNOSES  Final diagnoses:  Contusion of face, initial encounter  Injury of head, initial encounter  Sepsis, due to unspecified organism, unspecified whether acute  organ dysfunction present [facility](HCC)      Karmen StabsMenshew, Charlesetta IvoryJenise V Bacon, PA-C 01/28/19 1956    Jeanmarie PlantMcShane, James A, MD 01/31/19 [phone]

## 2019-01-28 NOTE — ED Notes (Signed)
ED TO INPATIENT HANDOFF REPORT  ED Nurse Name and Phone #: Shanda Bumpsjessica [phone]  S Name/Age/Gender Carla Dickson 48 y.o. female Room/Bed: ED19A/ED19A  Code Status   Code Status: Not on file  Home/SNF/Other Home Patient oriented to: self, place, time and situation Is this baseline? Yes   Triage Complete: Triage complete  Chief Complaint Fall via EMS   Triage Note PT arrives post fall. Pt does not remember fall. EMS reports a blood glucose of 163, BP 151/98, HR 152. Upon arrival to ED; pt has episode of emesis. Pt reports a 7/10 headache. Swelling observed to pt's left eye. Pt reports she remembers feeling dizzy prior to fall. Pt alert to person, place, and situation. Pt unsure of year; unknown baseline. No other deficits observed.    Allergies No Known Allergies  Level of Care/Admitting Diagnosis ED Disposition    ED Disposition Condition Comment   Admit  Hospital Area: [facility] [[id number]]  Level of Care: Med-Surg [16]  Covid Evaluation: N/A  Diagnosis: SIRS (systemic inflammatory response syndrome) [facility](HCC) [[id number]][[id number]]  Admitting Physician: Oralia ManisWILLIS, DAVID [[id number]]  Attending Physician: Anne HahnWILLIS, DAVID [phone][[id number]]  Estimated length of stay: past midnight tomorrow  Certification:: I certify this patient will need inpatient services for at least 2 midnights  PT Class (Do Not Modify): Inpatient [101]  PT Acc Code (Do Not Modify): Private [1]       B Medical/Surgery History Past Medical History:  Diagnosis Date  . Alcohol use   . Fracture of fifth metacarpal bone of left hand with routine healing   . Hypercholesteremia   . Hypertension   . Reflux    Past Surgical History:  Procedure Laterality Date  . ABDOMINAL HYSTERECTOMY       A IV Location/Drains/Wounds Patient Lines/Drains/Airways Status   Active Line/Drains/Airways    Name:   Placement date:   Placement time:   Site:   Days:   Peripheral IV 04/01/18 Right Arm   04/01/18    1445    Arm   302    Peripheral IV 04/13/18 Right Forearm   04/13/18    2219    Forearm   290   Peripheral IV 01/28/19 Right Forearm   01/28/19    1224    Forearm   less than 1   Peripheral IV 01/28/19 Left Antecubital   01/28/19    1955    Antecubital   less than 1          Intake/Output Last 24 hours No intake or output data in the 24 hours ending 01/28/19 2158  Labs/Imaging Results for orders placed or performed during the hospital encounter of 01/28/19 (from the past 48 hour(s))  Lipase, blood     Status: None   Collection Time: 01/28/19 12:22 PM  Result Value Ref Range   Lipase 47 11 - 51 U/L    Comment: Performed at [facility], [address] [place], [place] [id number]  Comprehensive metabolic panel     Status: Abnormal   Collection Time: 01/28/19 12:22 PM  Result Value Ref Range   Sodium 136 135 - 145 mmol/L   Potassium 3.3 (L) 3.5 - 5.1 mmol/L   Chloride 92 (L) 98 - 111 mmol/L   CO2 24 22 - 32 mmol/L   Glucose, Bld 185 (H) 70 - 99 mg/dL   BUN 13 6 - 20 mg/dL   Creatinine, Ser 1.300.78 0.44 - 1.00 mg/dL   Calcium 9.7 8.9 - 86.510.3 mg/dL   Total  Protein 9.3 (H) 6.5 - 8.1 g/dL   Albumin 4.7 3.5 - 5.0 g/dL   AST 956 (H) 15 - 41 U/L   ALT 96 (H) 0 - 44 U/L   Alkaline Phosphatase 133 (H) 38 - 126 U/L   Total Bilirubin 1.9 (H) 0.3 - 1.2 mg/dL   GFR calc non Af Amer >60 >60 mL/min   GFR calc Af Amer >60 >60 mL/min   Anion gap 20 (H) 5 - 15    Comment: Performed at [facility], [address] [place], [place] [address]  CBC     Status: Abnormal   Collection Time: 01/28/19 12:22 PM  Result Value Ref Range   WBC 7.9 4.0 - 10.5 K/uL   RBC 4.58 3.87 - 5.11 MIL/uL   Hemoglobin 14.0 12.0 - 15.0 g/dL   HCT 65.7 84.6 - 96.2 %   MCV 88.2 80.0 - 100.0 fL   MCH 30.6 26.0 - 34.0 pg   MCHC 34.7 30.0 - 36.0 g/dL   RDW 95.2 84.1 - 32.4 %   Platelets 71 (L) 150 - 400 K/uL    Comment: PLATELET COUNT CONFIRMED BY SMEAR Immature Platelet Fraction may be clinically indicated,  consider ordering this additional test [id number]    nRBC 0.0 0.0 - 0.2 %    Comment: Performed at [facility], [address] [place], [place] [address]  Troponin I - ONCE - STAT     Status: None   Collection Time: 01/28/19 12:22 PM  Result Value Ref Range   Troponin I <0.03 <0.03 ng/mL    Comment: Performed at [facility], [address] [place], [place] [address]  Magnesium     Status: Abnormal   Collection Time: 01/28/19 12:22 PM  Result Value Ref Range   Magnesium 1.5 (L) 1.7 - 2.4 mg/dL    Comment: Performed at [facility], [address] [place], [place] [address]  Procalcitonin - Baseline     Status: None   Collection Time: 01/28/19 12:22 PM  Result Value Ref Range   Procalcitonin 0.10 ng/mL    Comment:        Interpretation: PCT (Procalcitonin) <= 0.5 ng/mL: Systemic infection (sepsis) is not likely. Local bacterial infection is possible. (NOTE)       Sepsis PCT Algorithm           Lower Respiratory Tract                                      Infection PCT Algorithm    ----------------------------     ----------------------------         PCT < 0.25 ng/mL                PCT < 0.10 ng/mL         Strongly encourage             Strongly discourage   discontinuation of antibiotics    initiation of antibiotics    ----------------------------     -----------------------------       PCT 0.25 - 0.50 ng/mL            PCT 0.10 - 0.25 ng/mL               OR       >80% decrease in PCT            Discourage initiation of  antibiotics      Encourage discontinuation           of antibiotics    ----------------------------     -----------------------------         PCT >= 0.50 ng/mL              PCT 0.26 - 0.50 ng/mL               AND        <80% decrease in PCT             Encourage initiation of                                             antibiotics       Encourage continuation           of  antibiotics    ----------------------------     -----------------------------        PCT >= 0.50 ng/mL                  PCT > 0.50 ng/mL               AND         increase in PCT                  Strongly encourage                                      initiation of antibiotics    Strongly encourage escalation           of antibiotics                                     -----------------------------                                           PCT <= 0.25 ng/mL                                                 OR                                        > 80% decrease in PCT                                     Discontinue / Do not initiate                                             antibiotics Performed at [facility], [address] [place], [place] [address]   CBC with Differential/Platelet     Status: Abnormal   Collection Time: 01/28/19  12:22 PM  Result Value Ref Range   WBC 8.4 4.0 - 10.5 K/uL   RBC 4.59 3.87 - 5.11 MIL/uL   Hemoglobin 14.1 12.0 - 15.0 g/dL   HCT 84.1 32.4 - 40.1 %   MCV 89.1 80.0 - 100.0 fL   MCH 30.7 26.0 - 34.0 pg   MCHC 34.5 30.0 - 36.0 g/dL   RDW 02.7 (H) 25.3 - 66.4 %   Platelets 73 (L) 150 - 400 K/uL    Comment: Immature Platelet Fraction may be clinically indicated, consider ordering this additional test [id number]    nRBC 0.0 0.0 - 0.2 %   Neutrophils Relative % 73 %   Neutro Abs 6.1 1.7 - 7.7 K/uL   Lymphocytes Relative 20 %   Lymphs Abs 1.7 0.7 - 4.0 K/uL   Monocytes Relative 5 %   Monocytes Absolute 0.4 0.1 - 1.0 K/uL   Eosinophils Relative 1 %   Eosinophils Absolute 0.1 0.0 - 0.5 K/uL   Basophils Relative 1 %   Basophils Absolute 0.1 0.0 - 0.1 K/uL   Immature Granulocytes 0 %   Abs Immature Granulocytes 0.01 0.00 - 0.07 K/uL    Comment: Performed at [facility], [address] [place], [place] [address]  Protime-INR     Status: None   Collection Time: 01/28/19 12:34 PM  Result Value Ref Range   Prothrombin Time 11.8  11.4 - 15.2 seconds   INR 0.9 0.8 - 1.2    Comment: (NOTE) INR goal varies based on device and disease states. Performed at [facility], [address] [place], [place] [address]   APTT     Status: None   Collection Time: 01/28/19 12:34 PM  Result Value Ref Range   aPTT 30 24 - 36 seconds    Comment: Performed at [facility], [address] [place], [place] [address]  Ethanol     Status: None   Collection Time: 01/28/19  4:50 PM  Result Value Ref Range   Alcohol, Ethyl (B) <10 <10 mg/dL    Comment: (NOTE) Lowest detectable limit for serum alcohol is 10 mg/dL. For medical purposes only. Performed at [facility], [address] [place], [place] [address]   Lactic acid, plasma     Status: Abnormal   Collection Time: 01/28/19  7:52 PM  Result Value Ref Range   Lactic Acid, Venous 2.3 (HH) 0.5 - 1.9 mmol/L    Comment: CRITICAL RESULT CALLED TO, READ BACK BY AND VERIFIED WITH JESSICA Shelly Spenser ON 01/28/19 AT 2038 BY JAG Performed at [facility], [address], [place], [place] [address]   SARS Coronavirus 2 (CEPHEID- Performed in [facility] hospital lab), Hosp Order     Status: None   Collection Time: 01/28/19  7:52 PM  Result Value Ref Range   SARS Coronavirus 2 NEGATIVE NEGATIVE    Comment: (NOTE) If result is NEGATIVE SARS-CoV-2 target nucleic acids are NOT DETECTED. The SARS-CoV-2 RNA is generally detectable in upper and lower  respiratory specimens during the acute phase of infection. The lowest  concentration of SARS-CoV-2 viral copies this assay can detect is 250  copies / mL. A negative result does not preclude SARS-CoV-2 infection  and should not be used as the sole basis for treatment or other  patient management decisions.  A negative result may occur with  improper specimen collection / handling, submission of specimen other  than nasopharyngeal swab, presence of viral mutation(s) within the  areas targeted  by this  assay, and inadequate number of viral copies  (<250 copies / mL). A negative result must be combined with clinical  observations, patient history, and epidemiological information. If result is POSITIVE SARS-CoV-2 target nucleic acids are DETECTED. The SARS-CoV-2 RNA is generally detectable in upper and lower  respiratory specimens dur ing the acute phase of infection.  Positive  results are indicative of active infection with SARS-CoV-2.  Clinical  correlation with patient history and other diagnostic information is  necessary to determine patient infection status.  Positive results do  not rule out bacterial infection or co-infection with other viruses. If result is PRESUMPTIVE POSTIVE SARS-CoV-2 nucleic acids MAY BE PRESENT.   A presumptive positive result was obtained on the submitted specimen  and confirmed on repeat testing.  While 2019 novel coronavirus  (SARS-CoV-2) nucleic acids may be present in the submitted sample  additional confirmatory testing may be necessary for epidemiological  and / or clinical management purposes  to differentiate between  SARS-CoV-2 and other Sarbecovirus currently known to infect humans.  If clinically indicated additional testing with an alternate test  methodology [phone]) is advised. The SARS-CoV-2 RNA is generally  detectable in upper and lower respiratory sp ecimens during the acute  phase of infection. The expected result is Negative. Fact Sheet for Patients:  BoilerBrush.com.cy Fact Sheet for Healthcare Providers: https://pope.com/ This test is not yet approved or cleared by the [place] FDA and has been authorized for detection and/or diagnosis of SARS-CoV-2 by FDA under an Emergency Use Authorization (EUA).  This EUA will remain in effect (meaning this test can be used) for the duration of the COVID-19 declaration under Section 564(b)(1) of the Act, 21 U.S.C. section 360bbb-3(b)(1),  unless the authorization is terminated or revoked sooner. Performed at [facility], [address], [place], [place] [address]   Urine Drug Screen, Qualitative [facility] only)     Status: None   Collection Time: 01/28/19  7:52 PM  Result Value Ref Range   Tricyclic, Ur Screen NONE DETECTED NONE DETECTED   Amphetamines, Ur Screen NONE DETECTED NONE DETECTED   MDMA (Ecstasy)Ur Screen NONE DETECTED NONE DETECTED   Cocaine Metabolite,Ur [place] NONE DETECTED NONE DETECTED   Opiate, Ur Screen NONE DETECTED NONE DETECTED   Phencyclidine (PCP) Ur S NONE DETECTED NONE DETECTED   Cannabinoid 50 Ng, Ur [place] NONE DETECTED NONE DETECTED   Barbiturates, Ur Screen NONE DETECTED NONE DETECTED   Benzodiazepine, Ur Scrn NONE DETECTED NONE DETECTED   Methadone Scn, Ur NONE DETECTED NONE DETECTED    Comment: (NOTE) Tricyclics + metabolites, urine    Cutoff 1000 ng/mL Amphetamines + metabolites, urine  Cutoff 1000 ng/mL MDMA (Ecstasy), urine              Cutoff 500 ng/mL Cocaine Metabolite, urine          Cutoff 300 ng/mL Opiate + metabolites, urine        Cutoff 300 ng/mL Phencyclidine (PCP), urine         Cutoff 25 ng/mL Cannabinoid, urine                 Cutoff 50 ng/mL Barbiturates + metabolites, urine  Cutoff 200 ng/mL Benzodiazepine, urine              Cutoff 200 ng/mL Methadone, urine                   Cutoff 300 ng/mL The urine drug screen provides only a preliminary, unconfirmed analytical  test result and should not be used for non-medical purposes. Clinical consideration and professional judgment should be applied to any positive drug screen result due to possible interfering substances. A more specific alternate chemical method must be used in order to obtain a confirmed analytical result. Gas chromatography / mass spectrometry (GC/MS) is the preferred confirmat ory method. Performed at [facility], [address] [place], [place] [address]   Urinalysis, Complete w  Microscopic     Status: Abnormal   Collection Time: 01/28/19  7:52 PM  Result Value Ref Range   Color, Urine YELLOW (A) YELLOW   APPearance CLOUDY (A) CLEAR   Specific Gravity, Urine 1.015 1.005 - 1.030   pH 7.0 5.0 - 8.0   Glucose, UA NEGATIVE NEGATIVE mg/dL   Hgb urine dipstick MODERATE (A) NEGATIVE   Bilirubin Urine NEGATIVE NEGATIVE   Ketones, ur 5 (A) NEGATIVE mg/dL   Protein, ur 30 (A) NEGATIVE mg/dL   Nitrite NEGATIVE NEGATIVE   Leukocytes,Ua NEGATIVE NEGATIVE   RBC / HPF 11-20 0 - 5 RBC/hpf   WBC, UA 0-5 0 - 5 WBC/hpf   Bacteria, UA RARE (A) NONE SEEN   Squamous Epithelial / LPF 6-10 0 - 5   Mucus PRESENT     Comment: Performed at [facility], [address] [place], [place] [address]   Ct Head Wo Contrast  Result Date: 01/28/2019 CLINICAL DATA:  Status post fall. Pt does not remember fall. EMS reports a blood glucose of 163, BP 151/98, HR 152. Upon arrival to ED pt reports a 7/10 headache. Swelling observed to pt's left eye. EXAM: CT HEAD WITHOUT CONTRAST CT MAXILLOFACIAL WITHOUT CONTRAST CT CERVICAL SPINE WITHOUT CONTRAST TECHNIQUE: Multidetector CT imaging of the head, cervical spine, and maxillofacial structures were performed using the standard protocol without intravenous contrast. Multiplanar CT image reconstructions of the cervical spine and maxillofacial structures were also generated. COMPARISON:  04/13/2018 FINDINGS: CT HEAD FINDINGS Brain: No evidence of acute infarction, hemorrhage, hydrocephalus, extra-axial collection or mass lesion/mass effect. 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 Moderate-severe bilateral parietal lobe atrophy. Mild periventricular white matter low attenuation as can be seen with microvascular disease. Vascular: No hyperdense vessel or unexpected calcification. Skull: No osseous abnormality. Sinuses/Orbits: Visualized paranasal sinuses are clear. Visualized mastoid sinuses are clear. Visualized orbits demonstrate no focal  abnormality. Other: None CT MAXILLOFACIAL FINDINGS Osseous: No fracture or mandibular dislocation. No destructive process. Orbits: Negative. No traumatic or inflammatory finding. Sinuses: Mild mucosal thickening of the left maxillary sinus. Remainder the paranasal sinuses are clear. No air-fluid level. Soft tissues: Left infraorbital hemorrhagic contusion with surrounding soft tissue edema and swelling. CT CERVICAL SPINE FINDINGS Alignment: Normal. Skull base and vertebrae: No acute fracture. 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 No discitis or osteomyelitis. No aggressive osseous arthropathy. Soft tissues and spinal canal: No prevertebral fluid or swelling. No visible canal hematoma. Disc levels:  Disc heights are maintained.  No foraminal stenosis. Upper chest: Lung apices are clear. Other: No fluid collection or hematoma. IMPRESSION: 1. No acute intracranial pathology. 2. No acute osseous injury of the maxillofacial bones. Left infraorbital hemorrhagic contusion with surrounding soft tissue edema and swelling. 3.  No acute osseous injury of the cervical spine. Electronically Signed   By: Elige Ko   On: 01/28/2019 13:40   Ct Cervical Spine Wo Contrast  Result Date: 01/28/2019 CLINICAL DATA:  Status post fall. Pt does not remember fall. EMS reports a blood glucose of 163, BP 151/98,  HR 152. Upon arrival to ED pt reports a 7/10 headache. Swelling observed to pt's left eye. EXAM: CT HEAD WITHOUT CONTRAST CT MAXILLOFACIAL WITHOUT CONTRAST CT CERVICAL SPINE WITHOUT CONTRAST TECHNIQUE: Multidetector CT imaging of the head, cervical spine, and maxillofacial structures were performed using the standard protocol without intravenous contrast. Multiplanar CT image reconstructions of the cervical spine and maxillofacial structures were also generated. COMPARISON:  04/13/2018 FINDINGS: CT HEAD FINDINGS Brain: No evidence of acute infarction, hemorrhage, hydrocephalus, extra-axial collection or mass  lesion/mass effect. 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 Moderate-severe bilateral parietal lobe atrophy. Mild periventricular white matter low attenuation as can be seen with microvascular disease. Vascular: No hyperdense vessel or unexpected calcification. Skull: No osseous abnormality. Sinuses/Orbits: Visualized paranasal sinuses are clear. Visualized mastoid sinuses are clear. Visualized orbits demonstrate no focal abnormality. Other: None CT MAXILLOFACIAL FINDINGS Osseous: No fracture or mandibular dislocation. No destructive process. Orbits: Negative. No traumatic or inflammatory finding. Sinuses: Mild mucosal thickening of the left maxillary sinus. Remainder the paranasal sinuses are clear. No air-fluid level. Soft tissues: Left infraorbital hemorrhagic contusion with surrounding soft tissue edema and swelling. CT CERVICAL SPINE FINDINGS Alignment: Normal. Skull base and vertebrae: No acute fracture. 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 No discitis or osteomyelitis. No aggressive osseous arthropathy. Soft tissues and spinal canal: No prevertebral fluid or swelling. No visible canal hematoma. Disc levels:  Disc heights are maintained.  No foraminal stenosis. Upper chest: Lung apices are clear. Other: No fluid collection or hematoma. IMPRESSION: 1. No acute intracranial pathology. 2. No acute osseous injury of the maxillofacial bones. Left infraorbital hemorrhagic contusion with surrounding soft tissue edema and swelling. 3.  No acute osseous injury of the cervical spine. Electronically Signed   By: Elige Ko   On: 01/28/2019 13:40   Dg Chest Portable 1 View  Result Date: 01/28/2019 CLINICAL DATA:  Cough, fever, tachycardia, acute decreased oxygen saturation, history hypertension, smoking EXAM: PORTABLE CHEST 1 VIEW COMPARISON:  Portable exam 1816 hours compared to 04/01/2018 FINDINGS: Normal heart size, mediastinal contours, and pulmonary vascularity. Lungs clear. No  pulmonary infiltrate, pleural effusion or pneumothorax. Bones unremarkable. IMPRESSION: No acute abnormalities. Electronically Signed   By: Ulyses Southward M.D.   On: 01/28/2019 18:50   Ct Maxillofacial Wo Contrast  Result Date: 01/28/2019 CLINICAL DATA:  Status post fall. Pt does not remember fall. EMS reports a blood glucose of 163, BP 151/98, HR 152. Upon arrival to ED pt reports a 7/10 headache. Swelling observed to pt's left eye. EXAM: CT HEAD WITHOUT CONTRAST CT MAXILLOFACIAL WITHOUT CONTRAST CT CERVICAL SPINE WITHOUT CONTRAST TECHNIQUE: Multidetector CT imaging of the head, cervical spine, and maxillofacial structures were performed using the standard protocol without intravenous contrast. Multiplanar CT image reconstructions of the cervical spine and maxillofacial structures were also generated. COMPARISON:  04/13/2018 FINDINGS: CT HEAD FINDINGS Brain: No evidence of acute infarction, hemorrhage, hydrocephalus, extra-axial collection or mass lesion/mass effect. 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 Moderate-severe bilateral parietal lobe atrophy. Mild periventricular white matter low attenuation as can be seen with microvascular disease. Vascular: No hyperdense vessel or unexpected calcification. Skull: No osseous abnormality. Sinuses/Orbits: Visualized paranasal sinuses are clear. Visualized mastoid sinuses are clear. Visualized orbits demonstrate no focal abnormality. Other: None CT MAXILLOFACIAL FINDINGS Osseous: No fracture or mandibular dislocation. No destructive process. Orbits: Negative. No traumatic or inflammatory finding. Sinuses: Mild mucosal thickening of the left maxillary sinus. Remainder the paranasal sinuses are clear. No air-fluid level. Soft tissues: Left infraorbital hemorrhagic  contusion with surrounding soft tissue edema and swelling. CT CERVICAL SPINE FINDINGS Alignment: Normal. Skull base and vertebrae: No acute fracture. Old ununited type 2 dens fracture versus os  odontoideum. No discitis or osteomyelitis. No aggressive osseous arthropathy. Soft tissues and spinal canal: No prevertebral fluid or swelling. No visible canal hematoma. Disc levels:  Disc heights are maintained.  No foraminal stenosis. Upper chest: Lung apices are clear. Other: No fluid collection or hematoma. IMPRESSION: 1. No acute intracranial pathology. 2. No acute osseous injury of the maxillofacial bones. Left infraorbital hemorrhagic contusion with surrounding soft tissue edema and swelling. 3.  No acute osseous injury of the cervical spine. Electronically Signed   By: Elige Ko   On: 01/28/2019 13:40    Pending Labs Unresulted Labs (From admission, onward)    Start     Ordered   01/28/19 1928  Blood culture (routine x 2)  BLOOD CULTURE X 2,   STAT    Question:  Patient immune status  Answer:  Normal   01/28/19 1927   01/28/19 1927  Lactic acid, plasma  Now then every 2 hours,   STAT     01/28/19 1927   Signed and Held  HIV antibody (Routine Testing)  Once,   R     Signed and Held   Signed and Held  Basic metabolic panel  Tomorrow morning,   R     Signed and Held   Signed and Held  CBC  Tomorrow morning,   R     Signed and Held          Vitals/Pain Today's Vitals   01/28/19 1846 01/28/19 1900 01/28/19 1922 01/28/19 1922  BP:  (!) 155/90    Pulse: (!) 113     Resp:      Temp:    (!) 102.3 F (39.1 C)  TempSrc:    Oral  SpO2: 99%     Weight:      Height:      PainSc:   0-No pain     Isolation Precautions No active isolations  Medications Medications  magnesium sulfate IVPB 4 g 100 mL (has no administration in time range)  ondansetron (ZOFRAN) injection 4 mg (4 mg Intravenous Given 01/28/19 1225)  potassium chloride SA (K-DUR) CR tablet 20 mEq (20 mEq Oral Given 01/28/19 1541)  sodium chloride 0.9 % bolus 1,000 mL (0 mLs Intravenous Stopped 01/28/19 1910)  metoCLOPramide (REGLAN) injection 10 mg (10 mg Intravenous Given 01/28/19 1541)  ondansetron (ZOFRAN) injection 4  mg (4 mg Intravenous Given 01/28/19 1658)  sodium chloride 0.9 % bolus 1,000 mL (0 mLs Intravenous Stopped 01/28/19 1910)  acetaminophen (TYLENOL) tablet 1,000 mg (1,000 mg Oral Given 01/28/19 1802)  piperacillin-tazobactam (ZOSYN) IVPB 3.375 g (0 g Intravenous Stopped 01/28/19 2033)    Mobility walks with person assist High fall risk   Focused Assessments     R Recommendations: See Admitting Provider Note  Report given to:   Additional Notes:

## 2019-01-28 NOTE — ED Notes (Signed)
Pt assisted to restroom.  

## 2019-01-28 NOTE — ED Notes (Signed)
Pt found to be tachycardic, fever at 102 axillary, and oxygen sat at 90% on RA. See new orders.

## 2019-01-28 NOTE — H&P (Signed)
[facility] - [place] at [facility]   PATIENT NAME: Carla Dickson    MR#:  [id number]  DATE OF BIRTH:  [birth year]  DATE OF ADMISSION:  01/28/2019  PRIMARY CARE PHYSICIAN: Alba Cory, MD   REQUESTING/REFERRING PHYSICIAN: Menshew, PA  CHIEF COMPLAINT:   Chief Complaint  Patient presents with  . Fall    HISTORY OF PRESENT ILLNESS:  Carla Dickson  is a 48 y.o. female who presents with chief complaint as above.  Patient presents to the ED after a fall at home.  She is unable to provide much information on her HPI as she does not remember the fall.  Patient's husband states that she woke up this morning and seemed normal.  She went to take a shower and that is when he heard the noise indicating she had fallen.  He found her seemingly confused and called EMS.  On evaluation here in the ED she has significant left facial trauma with periorbital swelling and hematoma.  On CT imaging she has no acute intracranial abnormality and no bony facial fractures.  Her work-up was initially consistent with SIRS, but further work-up does not seem to show any acute infection.  Urinalysis does not look infected, chest x-ray was within normal limits.  She did have a fever while she was here, but her pro calcitonin is less than 0.1.  Previous alcohol levels have been significantly elevated, while today her alcohol level is negative.  She was empirically covered with antibiotics in the ED and hospitalist were called for admission.  PAST MEDICAL HISTORY:   Past Medical History:  Diagnosis Date  . Alcohol use   . Fracture of fifth metacarpal bone of left hand with routine healing   . Hypercholesteremia   . Hypertension   . Reflux      PAST SURGICAL HISTORY:   Past Surgical History:  Procedure Laterality Date  . ABDOMINAL HYSTERECTOMY       SOCIAL HISTORY:   Social History   Tobacco Use  . Smoking status: Current Every Day Smoker    Packs/day: 0.50    Types:  Cigarettes  . Smokeless tobacco: Never Used  Substance Use Topics  . Alcohol use: Yes    Alcohol/week: 26.0 standard drinks    Types: 24 Shots of liquor, 2 Cans of beer per week    Comment: 2-3 drinks liquor per day     FAMILY HISTORY:   Family History  Problem Relation Age of Onset  . Breast cancer Maternal Grandmother      DRUG ALLERGIES:  No Known Allergies  MEDICATIONS AT HOME:   Prior to Admission medications   Medication Sig Start Date End Date Taking? Authorizing Provider  omeprazole (PRILOSEC) 20 MG capsule Take 20 mg by mouth daily.   Yes [provider]  metoCLOPramide (REGLAN) 5 MG tablet Take 2 tablets (10 mg total) by mouth every 8 (eight) hours as needed for nausea or vomiting. 01/28/19   Menshew, Charlesetta Ivory, PA-C    REVIEW OF SYSTEMS:  Review of Systems  Constitutional: Positive for fever. Negative for chills, malaise/fatigue and weight loss.  HENT: Negative for ear pain, hearing loss and tinnitus.   Eyes: Negative for blurred vision, double vision, pain and redness.  Respiratory: Negative for cough, hemoptysis and shortness of breath.   Cardiovascular: Negative for chest pain, palpitations, orthopnea and leg swelling.  Gastrointestinal: Negative for abdominal pain, constipation, diarrhea, nausea and vomiting.  Genitourinary: Negative for dysuria, frequency and hematuria.  Musculoskeletal:  Positive for falls. Negative for back pain, joint pain and neck pain.  Skin:       No acne, rash, or lesions  Neurological: Positive for loss of consciousness. Negative for dizziness, tremors, focal weakness and weakness.  Endo/Heme/Allergies: Negative for polydipsia. Does not bruise/bleed easily.  Psychiatric/Behavioral: Negative for depression. The patient is not nervous/anxious and does not have insomnia.      VITAL SIGNS:   Vitals:   01/28/19 1833 01/28/19 1846 01/28/19 1900 01/28/19 1922  BP: (!) 167/100  (!) 155/90   Pulse: (!) 110 (!) 113    Resp:  (!) 22     Temp:    (!) 102.3 F (39.1 C)  TempSrc:    Oral  SpO2: 100% 99%    Weight:      Height:       Wt Readings from Last 3 Encounters:  01/28/19 59 kg  04/13/18 54.4 kg  04/01/18 63.5 kg    PHYSICAL EXAMINATION:  Physical Exam  Vitals reviewed. Constitutional: She is oriented to person, place, and time. She appears well-developed and well-nourished. No distress.  HENT:  Head: Normocephalic.  Mouth/Throat: Oropharynx is clear and moist.  Left periorbital swelling and hematoma  Eyes: Pupils are equal, round, and reactive to light. Conjunctivae and EOM are normal. No scleral icterus.  Neck: Normal range of motion. Neck supple. No JVD present. No thyromegaly present.  Cardiovascular: Normal rate, regular rhythm and intact distal pulses. Exam reveals no gallop and no friction rub.  No murmur heard. Respiratory: Effort normal and breath sounds normal. No respiratory distress. She has no wheezes. She has no rales.  GI: Soft. Bowel sounds are normal. She exhibits no distension. There is no abdominal tenderness.  Musculoskeletal: Normal range of motion.        General: No edema.     Comments: No arthritis, no gout  Lymphadenopathy:    She has no cervical adenopathy.  Neurological: She is alert and oriented to person, place, and time. No cranial nerve deficit.  No dysarthria, no aphasia  Skin: Skin is warm and dry. No rash noted. No erythema.  Psychiatric: She has a normal mood and affect. Her behavior is normal. Judgment and thought content normal.    LABORATORY PANEL:   CBC Recent Labs  Lab 01/28/19 1222  WBC 8.4  7.9  HGB 14.1  14.0  HCT 40.9  40.4  PLT 73*  71*   ------------------------------------------------------------------------------------------------------------------  Chemistries  Recent Labs  Lab 01/28/19 1222  NA 136  K 3.3*  CL 92*  CO2 24  GLUCOSE 185*  BUN 13  CREATININE 0.78  CALCIUM 9.7  MG 1.5*  AST 211*  ALT 96*  ALKPHOS 133*   BILITOT 1.9*   ------------------------------------------------------------------------------------------------------------------  Cardiac Enzymes Recent Labs  Lab 01/28/19 1222  TROPONINI <0.03   ------------------------------------------------------------------------------------------------------------------  RADIOLOGY:  Ct Head Wo Contrast  Result Date: 01/28/2019 CLINICAL DATA:  Status post fall. Pt does not remember fall. EMS reports a blood glucose of 163, BP 151/98, HR 152. Upon arrival to ED pt reports a 7/10 headache. Swelling observed to pt's left eye. EXAM: CT HEAD WITHOUT CONTRAST CT MAXILLOFACIAL WITHOUT CONTRAST CT CERVICAL SPINE WITHOUT CONTRAST TECHNIQUE: Multidetector CT imaging of the head, cervical spine, and maxillofacial structures were performed using the standard protocol without intravenous contrast. Multiplanar CT image reconstructions of the cervical spine and maxillofacial structures were also generated. COMPARISON:  04/13/2018 FINDINGS: CT HEAD FINDINGS Brain: No evidence of acute infarction, hemorrhage, hydrocephalus, extra-axial collection or  mass lesion/mass effect. 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 Moderate-severe bilateral parietal lobe atrophy. Mild periventricular white matter low attenuation as can be seen with microvascular disease. Vascular: No hyperdense vessel or unexpected calcification. Skull: No osseous abnormality. Sinuses/Orbits: Visualized paranasal sinuses are clear. Visualized mastoid sinuses are clear. Visualized orbits demonstrate no focal abnormality. Other: None CT MAXILLOFACIAL FINDINGS Osseous: No fracture or mandibular dislocation. No destructive process. Orbits: Negative. No traumatic or inflammatory finding. Sinuses: Mild mucosal thickening of the left maxillary sinus. Remainder the paranasal sinuses are clear. No air-fluid level. Soft tissues: Left infraorbital hemorrhagic contusion with surrounding soft tissue edema and  swelling. CT CERVICAL SPINE FINDINGS Alignment: Normal. Skull base and vertebrae: No acute fracture. 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 No discitis or osteomyelitis. No aggressive osseous arthropathy. Soft tissues and spinal canal: No prevertebral fluid or swelling. No visible canal hematoma. Disc levels:  Disc heights are maintained.  No foraminal stenosis. Upper chest: Lung apices are clear. Other: No fluid collection or hematoma. IMPRESSION: 1. No acute intracranial pathology. 2. No acute osseous injury of the maxillofacial bones. Left infraorbital hemorrhagic contusion with surrounding soft tissue edema and swelling. 3.  No acute osseous injury of the cervical spine. Electronically Signed   By: Elige Ko   On: 01/28/2019 13:40   Ct Cervical Spine Wo Contrast  Result Date: 01/28/2019 CLINICAL DATA:  Status post fall. Pt does not remember fall. EMS reports a blood glucose of 163, BP 151/98, HR 152. Upon arrival to ED pt reports a 7/10 headache. Swelling observed to pt's left eye. EXAM: CT HEAD WITHOUT CONTRAST CT MAXILLOFACIAL WITHOUT CONTRAST CT CERVICAL SPINE WITHOUT CONTRAST TECHNIQUE: Multidetector CT imaging of the head, cervical spine, and maxillofacial structures were performed using the standard protocol without intravenous contrast. Multiplanar CT image reconstructions of the cervical spine and maxillofacial structures were also generated. COMPARISON:  04/13/2018 FINDINGS: CT HEAD FINDINGS Brain: No evidence of acute infarction, hemorrhage, hydrocephalus, extra-axial collection or mass lesion/mass effect. 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 Moderate-severe bilateral parietal lobe atrophy. Mild periventricular white matter low attenuation as can be seen with microvascular disease. Vascular: No hyperdense vessel or unexpected calcification. Skull: No osseous abnormality. Sinuses/Orbits: Visualized paranasal sinuses are clear. Visualized mastoid sinuses are  clear. Visualized orbits demonstrate no focal abnormality. Other: None CT MAXILLOFACIAL FINDINGS Osseous: No fracture or mandibular dislocation. No destructive process. Orbits: Negative. No traumatic or inflammatory finding. Sinuses: Mild mucosal thickening of the left maxillary sinus. Remainder the paranasal sinuses are clear. No air-fluid level. Soft tissues: Left infraorbital hemorrhagic contusion with surrounding soft tissue edema and swelling. CT CERVICAL SPINE FINDINGS Alignment: Normal. Skull base and vertebrae: No acute fracture. 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 No discitis or osteomyelitis. No aggressive osseous arthropathy. Soft tissues and spinal canal: No prevertebral fluid or swelling. No visible canal hematoma. Disc levels:  Disc heights are maintained.  No foraminal stenosis. Upper chest: Lung apices are clear. Other: No fluid collection or hematoma. IMPRESSION: 1. No acute intracranial pathology. 2. No acute osseous injury of the maxillofacial bones. Left infraorbital hemorrhagic contusion with surrounding soft tissue edema and swelling. 3.  No acute osseous injury of the cervical spine. Electronically Signed   By: Elige Ko   On: 01/28/2019 13:40   Dg Chest Portable 1 View  Result Date: 01/28/2019 CLINICAL DATA:  Cough, fever, tachycardia, acute decreased oxygen saturation, history hypertension, smoking EXAM: PORTABLE CHEST 1 VIEW COMPARISON:  Portable exam 1816 hours  compared to 04/01/2018 FINDINGS: Normal heart size, mediastinal contours, and pulmonary vascularity. Lungs clear. No pulmonary infiltrate, pleural effusion or pneumothorax. Bones unremarkable. IMPRESSION: No acute abnormalities. Electronically Signed   By: Ulyses SouthwardMark  Boles M.D.   On: 01/28/2019 18:50   Ct Maxillofacial Wo Contrast  Result Date: 01/28/2019 CLINICAL DATA:  Status post fall. Pt does not remember fall. EMS reports a blood glucose of 163, BP 151/98, HR 152. Upon arrival to ED pt reports a 7/10  headache. Swelling observed to pt's left eye. EXAM: CT HEAD WITHOUT CONTRAST CT MAXILLOFACIAL WITHOUT CONTRAST CT CERVICAL SPINE WITHOUT CONTRAST TECHNIQUE: Multidetector CT imaging of the head, cervical spine, and maxillofacial structures were performed using the standard protocol without intravenous contrast. Multiplanar CT image reconstructions of the cervical spine and maxillofacial structures were also generated. COMPARISON:  04/13/2018 FINDINGS: CT HEAD FINDINGS Brain: No evidence of acute infarction, hemorrhage, hydrocephalus, extra-axial collection or mass lesion/mass effect. 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 Moderate-severe bilateral parietal lobe atrophy. Mild periventricular white matter low attenuation as can be seen with microvascular disease. Vascular: No hyperdense vessel or unexpected calcification. Skull: No osseous abnormality. Sinuses/Orbits: Visualized paranasal sinuses are clear. Visualized mastoid sinuses are clear. Visualized orbits demonstrate no focal abnormality. Other: None CT MAXILLOFACIAL FINDINGS Osseous: No fracture or mandibular dislocation. No destructive process. Orbits: Negative. No traumatic or inflammatory finding. Sinuses: Mild mucosal thickening of the left maxillary sinus. Remainder the paranasal sinuses are clear. No air-fluid level. Soft tissues: Left infraorbital hemorrhagic contusion with surrounding soft tissue edema and swelling. CT CERVICAL SPINE FINDINGS Alignment: Normal. Skull base and vertebrae: No acute fracture. 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 No discitis or osteomyelitis. No aggressive osseous arthropathy. Soft tissues and spinal canal: No prevertebral fluid or swelling. No visible canal hematoma. Disc levels:  Disc heights are maintained.  No foraminal stenosis. Upper chest: Lung apices are clear. Other: No fluid collection or hematoma. IMPRESSION: 1. No acute intracranial pathology. 2. No acute osseous injury of the  maxillofacial bones. Left infraorbital hemorrhagic contusion with surrounding soft tissue edema and swelling. 3.  No acute osseous injury of the cervical spine. Electronically Signed   By: Elige KoHetal  Patel   On: 01/28/2019 13:40    EKG:   Orders placed or performed during the hospital encounter of 01/28/19  . ED EKG  . ED EKG    IMPRESSION AND PLAN:  Principal Problem:   SIRS (systemic inflammatory response syndrome) (HCC) -unclear etiology, but with procalcitonin of less than 0.1 and no obvious source of infection is seen as it is possible that this is due to alcohol withdrawal.  That being said, she was covered with antibiotics in the ED and blood cultures were sent.  Lactic acid was mildly elevated, IV fluids given and we will recheck lactic acid until within normal limits.  Blood pressure has been stable. Active Problems:   Alcohol use -suspect alcohol withdrawal as her alcohol level today was negative, but in the past on her chart has been elevated.  Patient also has thrombocytopenia, and an AST to ALT ratio of 2-1.  We will have her on CIWA protocol, and we will get a right upper quadrant ultrasound to evaluate for the possibility of cirrhosis.  Avoid anticoagulants.  Seizure precaution as her fall and loss of consciousness could have been from alcohol withdrawal seizure   Fall -unclear etiology, work-up as above as well as echocardiogram.  Troponin was normal, and EKG did not show any signs of acute  ischemia.  Cardiology consult can be considered if syncope becomes the primary differential diagnosis.   HTN (hypertension) -patient is reportedly not on any antihypertensives at home.  Will use PRN antihypertensives here to keep her blood pressure less than 160/100   GERD (gastroesophageal reflux disease) -home dose PPI  Chart review performed and case discussed with ED provider. Labs, imaging and/or ECG reviewed by provider and discussed with patient/family. Management plans discussed with the  patient and/or family.  DVT PROPHYLAXIS: Mechanical only  GI PROPHYLAXIS:  PPI   ADMISSION STATUS: Inpatient     CODE STATUS: Full  TOTAL TIME TAKING CARE OF THIS PATIENT: 45 minutes.   Barney Drain 01/28/2019, 9:50 PM  [facility]  Office  [phone]  CC: Primary care physician; Alba Cory, MD  Note:  This document was prepared using Dragon voice recognition software and may include unintentional dictation errors.

## 2019-01-28 NOTE — ED Notes (Signed)
Pt placed in collar

## 2019-01-28 NOTE — Discharge Instructions (Addendum)
Your exam, CT scans are essentially normal. You have elevated liver functions, which should be seen by a GI specialist. Continue to drink fluids to prevent dehydration. Return to the ED immediately for headache, seizures, uncontrolled vomiting, or chest pain.

## 2019-01-28 NOTE — ED Notes (Signed)
Ultrasound at bedside

## 2019-01-28 NOTE — ED Notes (Signed)
Family updated.

## 2019-01-29 ENCOUNTER — Inpatient Hospital Stay
Admit: 2019-01-29 | Discharge: 2019-01-29 | Disposition: A | Discharge status: Home / Self Care | Payer: Self-pay | Attending: Internal Medicine | Admitting: Internal Medicine

## 2019-01-29 LAB — BASIC METABOLIC PANEL
Anion gap: 14 (ref 5–15)
BUN: 11 mg/dL (ref 6–20)
CO2: 27 mmol/L (ref 22–32)
Calcium: 8.5 mg/dL — ABNORMAL LOW (ref 8.9–10.3)
Chloride: 90 mmol/L — ABNORMAL LOW (ref 98–111)
Creatinine, Ser: 0.76 mg/dL (ref 0.44–1.00)
GFR calc Af Amer: >60 mL/min (ref >60)
GFR calc non Af Amer: >60 mL/min (ref >60)
Glucose, Bld: 124 mg/dL — ABNORMAL HIGH (ref 70–99)
Potassium: 2.9 mmol/L — ABNORMAL LOW (ref 3.5–5.1)
Sodium: 131 mmol/L — ABNORMAL LOW (ref 135–145)

## 2019-01-29 LAB — CBC
HCT: 33.8 % — ABNORMAL LOW (ref 36.0–46.0)
Hemoglobin: 11.6 g/dL — ABNORMAL LOW (ref 12.0–15.0)
MCH: 30.8 pg (ref 26.0–34.0)
MCHC: 34.3 g/dL (ref 30.0–36.0)
MCV: 89.7 fL (ref 80.0–100.0)
Platelets: 60 10*3/uL — ABNORMAL LOW (ref 150–400)
RBC: 3.77 MIL/uL — ABNORMAL LOW (ref 3.87–5.11)
RDW: 15.6 % — ABNORMAL HIGH (ref 11.5–15.5)
WBC: 11.4 10*3/uL — ABNORMAL HIGH (ref 4.0–10.5)
nRBC: 0 % (ref 0.0–0.2)

## 2019-01-29 LAB — ECHOCARDIOGRAM COMPLETE
Height: 67 in
Weight: 2080 oz

## 2019-01-29 LAB — MAGNESIUM: Magnesium: 1.2 mg/dL — ABNORMAL LOW (ref 1.7–2.4)

## 2019-01-29 LAB — LACTIC ACID, PLASMA: Lactic Acid, Venous: 1.3 mmol/L (ref 0.5–1.9)

## 2019-01-29 MED ORDER — HALOPERIDOL LACTATE 5 MG/ML IJ SOLN
5.0000 mg | Freq: Once | INTRAMUSCULAR | Status: AC
  Administered 2019-01-30: 5 mg via INTRAVENOUS
  Filled 2019-01-29: qty 1

## 2019-01-29 MED ORDER — SODIUM CHLORIDE 0.9 % IV SOLN
INTRAVENOUS | Status: AC
  Administered 2019-01-29: 11:00:00 via INTRAVENOUS

## 2019-01-29 MED ORDER — ALUM & MAG HYDROXIDE-SIMETH 200-200-20 MG/5ML PO SUSP
30.0000 mL | ORAL | Status: DC | PRN
  Administered 2019-01-29: 30 mL via ORAL
  Filled 2019-01-29: qty 30

## 2019-01-29 MED ORDER — METOPROLOL TARTRATE 25 MG PO TABS
25.0000 mg | ORAL_TABLET | Freq: Two times a day (BID) | ORAL | Status: DC
  Administered 2019-01-29 – 2019-02-01 (×7): 25 mg via ORAL
  Filled 2019-01-29 (×7): qty 1

## 2019-01-29 MED ORDER — AMLODIPINE BESYLATE 5 MG PO TABS
5.0000 mg | ORAL_TABLET | Freq: Every day | ORAL | Status: DC
  Administered 2019-01-29 – 2019-02-01 (×4): 5 mg via ORAL
  Filled 2019-01-29 (×5): qty 1

## 2019-01-29 MED ORDER — BUTALBITAL-APAP-CAFFEINE 50-325-40 MG PO TABS
2.0000 | ORAL_TABLET | Freq: Once | ORAL | Status: AC
  Administered 2019-01-29: 2 via ORAL
  Filled 2019-01-29: qty 2

## 2019-01-29 MED ORDER — ALUM & MAG HYDROXIDE-SIMETH 200-200-20 MG/5 ML NICU TOPICAL
1.0000 "application " | TOPICAL | Status: DC | PRN
  Filled 2019-01-29: qty 355

## 2019-01-29 MED ORDER — POTASSIUM CHLORIDE CRYS ER 20 MEQ PO TBCR
40.0000 meq | EXTENDED_RELEASE_TABLET | Freq: Two times a day (BID) | ORAL | Status: AC
  Administered 2019-01-29 (×2): 40 meq via ORAL
  Filled 2019-01-29 (×2): qty 2

## 2019-01-29 MED ORDER — MAGNESIUM SULFATE 4 GM/100ML IV SOLN
4.0000 g | Freq: Once | INTRAVENOUS | Status: AC
  Administered 2019-01-29: 4 g via INTRAVENOUS
  Filled 2019-01-29: qty 100

## 2019-01-29 NOTE — Progress Notes (Signed)
[facility] - [place] at [facility]   PATIENT NAME: Carla Dickson    MR#:  [id number]  DATE OF BIRTH:  1970/12/22  SUBJECTIVE:  CHIEF COMPLAINT:   Chief Complaint  Patient presents with  . Fall   The patient has no complaints.  She has tachycardia about 120s this morning.  She denies any alcohol drinking for the past 2 months. REVIEW OF SYSTEMS:  Review of Systems  Constitutional: Negative for chills, fever and malaise/fatigue.  HENT: Negative for sore throat.   Eyes: Negative for blurred vision and double vision.  Respiratory: Negative for cough, hemoptysis, shortness of breath, wheezing and stridor.   Cardiovascular: Negative for chest pain, palpitations, orthopnea and leg swelling.  Gastrointestinal: Negative for abdominal pain, blood in stool, diarrhea, melena, nausea and vomiting.  Genitourinary: Negative for dysuria, flank pain and hematuria.  Musculoskeletal: Negative for back pain and joint pain.  Neurological: Positive for tremors. Negative for dizziness, sensory change, focal weakness, seizures, loss of consciousness, weakness and headaches.  Endo/Heme/Allergies: Negative for polydipsia.  Psychiatric/Behavioral: Negative for depression. The patient is not nervous/anxious.     DRUG ALLERGIES:  No Known Allergies VITALS:  Blood pressure (!) 150/97, pulse (!) 103, temperature 98.7 F (37.1 C), temperature source Oral, resp. rate 18, height 5\' 7"  (1.702 m), weight 59 kg, SpO2 97 %. PHYSICAL EXAMINATION:  Physical Exam Constitutional:      General: She is not in acute distress. HENT:     Head: Normocephalic.     Comments: Hematoma and bruises arround left orbital area.    Mouth/Throat:     Mouth: Mucous membranes are moist.  Eyes:     General: No scleral icterus.    Conjunctiva/sclera: Conjunctivae normal.     Pupils: Pupils are equal, round, and reactive to light.  Neck:     Musculoskeletal: Normal range of motion and neck supple.   Vascular: No JVD.     Trachea: No tracheal deviation.  Cardiovascular:     Rate and Rhythm: Regular rhythm. Tachycardia present.     Heart sounds: Normal heart sounds. No murmur. No gallop.   Pulmonary:     Effort: Pulmonary effort is normal. No respiratory distress.     Breath sounds: Normal breath sounds. No wheezing or rales.  Abdominal:     General: Bowel sounds are normal. There is no distension.     Palpations: Abdomen is soft.     Tenderness: There is no abdominal tenderness. There is no rebound.  Musculoskeletal: Normal range of motion.        General: No tenderness.     Right lower leg: No edema.     Left lower leg: No edema.  Skin:    Findings: No erythema or rash.  Neurological:     General: No focal deficit present.     Mental Status: She is alert and oriented to person, place, and time.     Cranial Nerves: No cranial nerve deficit.  Psychiatric:        Mood and Affect: Mood normal.    LABORATORY PANEL:  Female CBC Recent Labs  Lab 01/29/19 0027  WBC 11.4*  HGB 11.6*  HCT 33.8*  PLT 60*   ------------------------------------------------------------------------------------------------------------------ Chemistries  Recent Labs  Lab 01/28/19 1222 01/29/19 0027  NA 136 131*  K 3.3* 2.9*  CL 92* 90*  CO2 24 27  GLUCOSE 185* 124*  BUN 13 11  CREATININE 0.78 0.76  CALCIUM 9.7 8.5*  MG 1.5* 1.2*  AST  211*  --   ALT 96*  --   ALKPHOS 133*  --   BILITOT 1.9*  --    RADIOLOGY:  Dg Chest Portable 1 View  Result Date: 01/28/2019 CLINICAL DATA:  Cough, fever, tachycardia, acute decreased oxygen saturation, history hypertension, smoking EXAM: PORTABLE CHEST 1 VIEW COMPARISON:  Portable exam 1816 hours compared to 04/01/2018 FINDINGS: Normal heart size, mediastinal contours, and pulmonary vascularity. Lungs clear. No pulmonary infiltrate, pleural effusion or pneumothorax. Bones unremarkable. IMPRESSION: No acute abnormalities. Electronically Signed   By: Ulyses Southward M.D.   On: 01/28/2019 18:50   US Abdomen Limited Ruq  Result Date: 01/28/2019 CLINICAL DATA:  Thrombocytopenia, question cirrhosis EXAM: ULTRASOUND ABDOMEN LIMITED RIGHT UPPER QUADRANT COMPARISON:  None. FINDINGS: Gallbladder: No gallstones or wall thickening visualized. No sonographic Murphy sign noted by sonographer. Common bile duct: Diameter: Normal caliber, 3 mm Liver: Increased echotexture compatible with fatty infiltration. No focal abnormality or biliary ductal dilatation. Portal vein is patent on color Doppler imaging with normal direction of blood flow towards the liver. IMPRESSION: Fatty infiltration of the liver. No evidence of cholelithiasis or cholecystitis. Electronically Signed   By: Charlett Nose M.D.   On: 01/28/2019 23:02   ASSESSMENT AND PLAN:    SIRS (systemic inflammatory response syndrome) (HCC) -unclear etiology, but with procalcitonin of less than 0.1 and no obvious source of infection is seen as it is possible that this is due to alcohol withdrawal.  That being said, she was covered with antibiotics in the ED and blood cultures were sent.  Lactic acid was mildly elevated, improved with IV fluid.    Alcohol use -suspect alcohol withdrawal  on CIWA protocol, and we will get a right upper quadrant ultrasound: Fatty infiltration of the liver. No evidence of cholelithiasis or cholecystitis.     Fall -unclear etiology, the patient said she fell by accident but no syncope or seizure.  Echocardiogram is pending. Troponin was normal, and EKG did not show any signs of acute ischemia.  Urine toxicology is unremarkable.    HTN (hypertension), she is not on any hypertension medication at home.  Start Lopressor p.o.    GERD (gastroesophageal reflux disease) -home dose PPI  Hypokalemia.  Potassium supplement. Severe hypomagnesemia.  IV magnesium. Hyponatremia.  Continue normal saline IV and follow-up BMP. Tachycardia. Treatment as above.  All the records are reviewed and case  discussed with Care Management/Social Worker. Management plans discussed with the patient, her husband and they are in agreement.  CODE STATUS: Full Code  TOTAL TIME TAKING CARE OF THIS PATIENT: 32 minutes.   More than 50% of the time was spent in counseling/coordination of care: YES  POSSIBLE D/C IN 2-3 DAYS, DEPENDING ON CLINICAL CONDITION.  Shaune Pollack M.D on 01/29/2019 at 2:04 PM  Between 7am to 6pm - Pager - [phone]  After 6pm go to [email or web address] - Therapist, [facility]

## 2019-01-29 NOTE — Progress Notes (Addendum)
MD  Notified: sodium  levels 131, chloride 90, K 2.9 WBC 11.4 this morning compare to yesterday WBC 8.4. Low grade fever overnight- 100.1, rechecked temp 99.2. Tele notified me as well Heart rate increases to 150ths when getting up.

## 2019-01-29 NOTE — Progress Notes (Signed)
*  PRELIMINARY RESULTS* Echocardiogram 2D Echocardiogram has been performed.  Carla Dickson Carla Dickson 01/29/2019, 9:48 AM

## 2019-01-30 LAB — BASIC METABOLIC PANEL
Anion gap: 10 (ref 5–15)
BUN: 10 mg/dL (ref 6–20)
CO2: 27 mmol/L (ref 22–32)
Calcium: 9.4 mg/dL (ref 8.9–10.3)
Chloride: 100 mmol/L (ref 98–111)
Creatinine, Ser: 0.84 mg/dL (ref 0.44–1.00)
GFR calc Af Amer: >60 mL/min (ref >60)
GFR calc non Af Amer: >60 mL/min (ref >60)
Glucose, Bld: 126 mg/dL — ABNORMAL HIGH (ref 70–99)
Potassium: 3.1 mmol/L — ABNORMAL LOW (ref 3.5–5.1)
Sodium: 137 mmol/L (ref 135–145)

## 2019-01-30 LAB — CBC
HCT: 36.7 % (ref 36.0–46.0)
Hemoglobin: 12.5 g/dL (ref 12.0–15.0)
MCH: 30.8 pg (ref 26.0–34.0)
MCHC: 34.1 g/dL (ref 30.0–36.0)
MCV: 90.4 fL (ref 80.0–100.0)
Platelets: 68 10*3/uL — ABNORMAL LOW (ref 150–400)
RBC: 4.06 MIL/uL (ref 3.87–5.11)
RDW: 15.7 % — ABNORMAL HIGH (ref 11.5–15.5)
WBC: 7.6 10*3/uL (ref 4.0–10.5)
nRBC: 0 % (ref 0.0–0.2)

## 2019-01-30 LAB — MAGNESIUM: Magnesium: 2.3 mg/dL (ref 1.7–2.4)

## 2019-01-30 LAB — HIV ANTIBODY (ROUTINE TESTING W REFLEX): HIV Screen 4th Generation wRfx: NONREACTIVE

## 2019-01-30 MED ORDER — ZIPRASIDONE MESYLATE 20 MG IM SOLR
20.0000 mg | Freq: Once | INTRAMUSCULAR | Status: AC
  Administered 2019-01-30: 20 mg via INTRAMUSCULAR
  Filled 2019-01-30: qty 20

## 2019-01-30 MED ORDER — POTASSIUM CHLORIDE CRYS ER 20 MEQ PO TBCR
40.0000 meq | EXTENDED_RELEASE_TABLET | Freq: Two times a day (BID) | ORAL | Status: AC
  Administered 2019-01-30 (×2): 40 meq via ORAL
  Filled 2019-01-30 (×2): qty 2

## 2019-01-30 NOTE — Progress Notes (Signed)
[employer] - [place] at [facility]   PATIENT NAME: Carla Dickson    MR#:  [id number]  DATE OF BIRTH:  [birth year]  SUBJECTIVE:  CHIEF COMPLAINT:   Chief Complaint  Patient presents with  . Fall   The patient is agitated and was given Ativan early this morning. REVIEW OF SYSTEMS:  Review of Systems  Unable to perform ROS: Medical condition    DRUG ALLERGIES:  No Known Allergies VITALS:  Blood pressure 99/71, pulse 98, temperature 97.7 F (36.5 C), temperature source Oral, resp. rate 18, height 5\' 7"  (1.702 m), weight 59 kg, SpO2 100 %. PHYSICAL EXAMINATION:  Physical Exam Constitutional:      General: She is not in acute distress. HENT:     Head: Normocephalic.     Comments: Hematoma and bruises arround left orbital area.    Mouth/Throat:     Mouth: Mucous membranes are moist.  Eyes:     General: No scleral icterus.    Conjunctiva/sclera: Conjunctivae normal.     Pupils: Pupils are equal, round, and reactive to light.  Neck:     Musculoskeletal: Normal range of motion and neck supple.     Vascular: No JVD.     Trachea: No tracheal deviation.  Cardiovascular:     Rate and Rhythm: Regular rhythm. Tachycardia present.     Heart sounds: Normal heart sounds. No murmur. No gallop.   Pulmonary:     Effort: Pulmonary effort is normal. No respiratory distress.     Breath sounds: Normal breath sounds. No wheezing or rales.  Abdominal:     General: Bowel sounds are normal. There is no distension.     Palpations: Abdomen is soft.     Tenderness: There is no abdominal tenderness. There is no rebound.  Musculoskeletal: Normal range of motion.        General: No tenderness.     Right lower leg: No edema.     Left lower leg: No edema.  Skin:    Findings: No erythema or rash.  Neurological:     General: No focal deficit present.     Mental Status: She is alert.  Psychiatric:     Comments: AAO x2 but confused.    LABORATORY PANEL:  Female CBC  Recent Labs  Lab 01/30/19 0457  WBC 7.6  HGB 12.5  HCT 36.7  PLT 68*   ------------------------------------------------------------------------------------------------------------------ Chemistries  Recent Labs  Lab 01/28/19 1222  01/30/19 0457  NA 136   < > 137  K 3.3*   < > 3.1*  CL 92*   < > 100  CO2 24   < > 27  GLUCOSE 185*   < > 126*  BUN 13   < > 10  CREATININE 0.78   < > 0.84  CALCIUM 9.7   < > 9.4  MG 1.5*   < > 2.3  AST 211*  --   --   ALT 96*  --   --   ALKPHOS 133*  --   --   BILITOT 1.9*  --   --    < > = values in this interval not displayed.   RADIOLOGY:  No results found. ASSESSMENT AND PLAN:    SIRS (systemic inflammatory response syndrome) (HCC) -unclear etiology, but with procalcitonin of less than 0.1 and no obvious source of infection is seen as it is possible that this is due to alcohol withdrawal.  That being said, she was covered with antibiotics in the  ED and blood cultures were sent.  Lactic acid was mildly elevated, improved with IV fluid.    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 Right upper quadrant ultrasound: Fatty infiltration of the liver. No evidence of cholelithiasis or cholecystitis.     Fall -unclear etiology, the patient said she fell by accident but no syncope or seizure.  Echocardiogram is pending. Troponin was normal, and EKG did not show any signs of acute ischemia.  Urine toxicology is unremarkable.    HTN (hypertension), she is not on any hypertension medication at home.  Started Lopressor p.o.    GERD (gastroesophageal reflux disease) -home dose PPI  Hypokalemia.  Potassium supplement. Severe hypomagnesemia.  Improved with IV magnesium. Hyponatremia.  Improved with normal saline IV. Tachycardia. Treatment as above.  All the records are reviewed and case discussed with Care Management/Social Worker. Management plans discussed with the patient, her husband and they are in agreement.  CODE STATUS: Full Code  TOTAL TIME  TAKING CARE OF THIS PATIENT: 32 minutes.   More than 50% of the time was spent in counseling/coordination of care: YES  POSSIBLE D/C IN 2-3 DAYS, DEPENDING ON CLINICAL CONDITION.  Shaune Pollack M.D on 01/30/2019 at 2:44 PM  Between 7am to 6pm - Pager - [phone]  After 6pm go to [email or web address] - Therapist, nutritional Hospitalists

## 2019-01-30 NOTE — Progress Notes (Signed)
Pt ciwa score is 7. Ativan 1mg  given per protocol. Pt more confused and agitated after. MD notified. Received orders for haldol 5mg  IV x 1. Dose given. Pt is calm but still confused. 1:1 sitter in place. Will continue to monitor.

## 2019-01-30 NOTE — Progress Notes (Signed)
MD notified: Earlier the patient was disoriented x2 and know she is disoriented x3. The patient is is only oriented to self. It seems as the day progresses the patient becomes more confused.

## 2019-01-30 NOTE — Progress Notes (Addendum)
1:1 sitter. The patient tries to leave and go home. The patient has been verbally inappropriate with nurse techs. Jumps out of bed. Confused x2. Shaking arms. Poor balance. On a CIWA protocal Q6.

## 2019-01-30 NOTE — Progress Notes (Addendum)
MD notified: The patient is disoriented x3 this morning. 2mg  of ativan given. Has been was convative this morning. 1:1 sitter. Her potassium level is 3.1 this morning. Heart rate increases to 160's when getting out of bed.

## 2019-01-31 LAB — BASIC METABOLIC PANEL
Anion gap: 12 (ref 5–15)
BUN: 12 mg/dL (ref 6–20)
CO2: 25 mmol/L (ref 22–32)
Calcium: 10.1 mg/dL (ref 8.9–10.3)
Chloride: 101 mmol/L (ref 98–111)
Creatinine, Ser: 0.78 mg/dL (ref 0.44–1.00)
GFR calc Af Amer: >60 mL/min (ref >60)
GFR calc non Af Amer: >60 mL/min (ref >60)
Glucose, Bld: 126 mg/dL — ABNORMAL HIGH (ref 70–99)
Potassium: 3.8 mmol/L (ref 3.5–5.1)
Sodium: 138 mmol/L (ref 135–145)

## 2019-01-31 NOTE — Progress Notes (Signed)
Telesitter placed back at bedside for monitoring. Bo Mcclintock, RN

## 2019-01-31 NOTE — Progress Notes (Signed)
PT Cancellation Note  Patient Details Name: Carla Dickson MRN: [id number] DOB: Apr 17, 1971   Cancelled Treatment:    Reason Eval/Treat Not Completed: Patient's level of consciousness Chart reviewed. Nursing consulted. Patient oriented to self and place at this time. Nursing administered ativan just prior to PT arrival to the floor. PT will follow-up at a later time/date.  Sheria Lang PT, DPT [phone] 01/31/2019, 3:15 PM

## 2019-01-31 NOTE — Progress Notes (Signed)
PT Cancellation Note  Patient Details Name: MELYNIE BURLING MRN: [id number] DOB: [birth year]   Cancelled Treatment:    Reason Eval/Treat Not Completed: Patient's level of consciousness Chart reviewed. Nursing consulted. Patient with MD at bedside on PT arrival. Patient oriented to self only and drowsy. PT will follow-up at a later time/date.   Sheria Lang PT, DPT [phone] 01/31/2019, 9:25 AM

## 2019-01-31 NOTE — Progress Notes (Signed)
[employer] - [place] at [facility]   PATIENT NAME: Carla Dickson    MR#:  [id number]  DATE OF BIRTH:  13-Sep-1971  SUBJECTIVE:  CHIEF COMPLAINT:   Chief Complaint  Patient presents with  . Fall   The patient is confused and has hallucination this morning. REVIEW OF SYSTEMS:  Review of Systems  Unable to perform ROS: Medical condition    DRUG ALLERGIES:  No Known Allergies VITALS:  Blood pressure (!) 131/94, pulse (!) 104, temperature 98.7 F (37.1 C), temperature source Oral, resp. rate 18, height 5\' 7"  (1.702 m), weight 59 kg, SpO2 100 %. PHYSICAL EXAMINATION:  Physical Exam Constitutional:      General: She is not in acute distress. HENT:     Head: Normocephalic.     Comments: Hematoma and bruises arround left orbital area.    Mouth/Throat:     Mouth: Mucous membranes are moist.  Eyes:     General: No scleral icterus.    Conjunctiva/sclera: Conjunctivae normal.     Pupils: Pupils are equal, round, and reactive to light.  Neck:     Musculoskeletal: Normal range of motion and neck supple.     Vascular: No JVD.     Trachea: No tracheal deviation.  Cardiovascular:     Rate and Rhythm: Regular rhythm. Tachycardia present.     Heart sounds: Normal heart sounds. No murmur. No gallop.   Pulmonary:     Effort: Pulmonary effort is normal. No respiratory distress.     Breath sounds: Normal breath sounds. No wheezing or rales.  Abdominal:     General: Bowel sounds are normal. There is no distension.     Palpations: Abdomen is soft.     Tenderness: There is no abdominal tenderness. There is no rebound.  Musculoskeletal: Normal range of motion.        General: No tenderness.     Right lower leg: No edema.     Left lower leg: No edema.  Skin:    Findings: No erythema or rash.  Neurological:     General: No focal deficit present.     Mental Status: She is alert.  Psychiatric:     Comments: AAO x2 but confused.    LABORATORY PANEL:  Female CBC  Recent Labs  Lab 01/30/19 0457  WBC 7.6  HGB 12.5  HCT 36.7  PLT 68*   ------------------------------------------------------------------------------------------------------------------ Chemistries  Recent Labs  Lab 01/28/19 1222  01/30/19 0457 01/31/19 0441  NA 136   < > 137 138  K 3.3*   < > 3.1* 3.8  CL 92*   < > 100 101  CO2 24   < > 27 25  GLUCOSE 185*   < > 126* 126*  BUN 13   < > 10 12  CREATININE 0.78   < > 0.84 0.78  CALCIUM 9.7   < > 9.4 10.1  MG 1.5*   < > 2.3  --   AST 211*  --   --   --   ALT 96*  --   --   --   ALKPHOS 133*  --   --   --   BILITOT 1.9*  --   --   --    < > = values in this interval not displayed.   RADIOLOGY:  No results found. ASSESSMENT AND PLAN:    SIRS (systemic inflammatory response syndrome) (HCC) -unclear etiology, but with procalcitonin of less than 0.1 and no obvious source of  infection is seen as it is possible that this is due to alcohol withdrawal.  That being said, she was covered with antibiotics in the ED and blood cultures were sent.  Lactic acid was mildly elevated, improved with IV fluid.    Alcohol withdrawal  Continue Ativan as needed, avoid CIWA protocol. Right upper quadrant ultrasound: Fatty infiltration of the liver. No evidence of cholelithiasis or cholecystitis.     Fall -unclear etiology, the patient said she fell by accident but no syncope or seizure.  Echocardiogram is unremarkable. Troponin was normal, and EKG did not show any signs of acute ischemia.  Urine toxicology is unremarkable.    HTN (hypertension), she is not on any hypertension medication at home.  Continue Lopressor p.o.    GERD (gastroesophageal reflux disease) -home dose PPI  Hypokalemia.  Improved with potassium supplement. Severe hypomagnesemia.  Improved with IV magnesium. Hyponatremia.  Improved with normal saline IV. Tachycardia. Treatment as above. Thrombocytopenia.  Possible related to alcohol use.  Platelet is 68,000.  SCD.  No Lovenox  or heparin. All the records are reviewed and case discussed with Care Management/Social Worker. Management plans discussed with the patient, her husband and they are in agreement.  CODE STATUS: Full Code  TOTAL TIME TAKING CARE OF THIS PATIENT: 28 minutes.   More than 50% of the time was spent in counseling/coordination of care: YES  POSSIBLE D/C IN 2-3 DAYS, DEPENDING ON CLINICAL CONDITION.  Shaune Pollack M.D on 01/31/2019 at 12:41 PM  Between 7am to 6pm - Pager - [phone]  After 6pm go to [email or web address] - Therapist, nutritional Hospitalists

## 2019-01-31 NOTE — Progress Notes (Signed)
Assigned to pt from 1610-1900.  Pt a&ox4. Cooperative. Denies pain. Sitting on the recliner. Telesitter in use. CIWA score 1.

## 2019-02-01 NOTE — Progress Notes (Signed)
Pt signed AMA paperwork. Pt was advised that the doctor recommended that she stay another day. Pt said that she wanted to go home and that the doctor had not told her that. RN spoke with Dr. Enid Baas. Dr. Enid Baas reported that he did tell her that. Pt says she is calling her husband and that she is going home.

## 2019-02-01 NOTE — TOC Progression Note (Signed)
Transition of Care [facility]) - Progression Note    Patient Details  Name: CAYMAN FORT MRN: [id number] Date of Birth: Nov 12, 1970  Transition of Care [facility]) CM/SW Contact  Chapman Fitch, RN Phone Number: 02/01/2019, 1:51 PM  Clinical Narrative:        Notified patient will be leaving AMA    Expected Discharge Plan and Services                                                 Social Determinants of Health (SDOH) Interventions    Readmission Risk Interventions No flowsheet data found.

## 2019-02-01 NOTE — Progress Notes (Signed)
[employer] - [place] at [facility]   PATIENT NAME: Carla Dickson    MR#:  [id number]  DATE OF BIRTH:  December 22, 1970  SUBJECTIVE:  CHIEF COMPLAINT:   Chief Complaint  Patient presents with  . Fall  No new complaint this morning.  Patient sitting up at the bedside.  Physical therapist in the room to work with patient today.  REVIEW OF SYSTEMS:  Review of Systems  Constitutional: Negative for chills and fever.  HENT: Negative for hearing loss and tinnitus.   Eyes: Negative for blurred vision and double vision.  Respiratory: Negative for cough and hemoptysis.   Cardiovascular: Negative for chest pain and palpitations.  Gastrointestinal: Negative for heartburn and nausea.  Genitourinary: Negative for dysuria and urgency.  Musculoskeletal: Negative for myalgias and neck pain.  Skin: Negative for itching and rash.  Neurological: Negative for dizziness and headaches.  Psychiatric/Behavioral: Negative for depression and hallucinations.    DRUG ALLERGIES:  No Known Allergies VITALS:  Blood pressure 108/75, pulse 100, temperature 97.6 F (36.4 C), temperature source Oral, resp. rate 17, height 5\' 7"  (1.702 m), weight 59 kg, SpO2 100 %. PHYSICAL EXAMINATION:  Physical Exam  Constitutional: She is oriented to person, place, and time. She appears well-developed and well-nourished.  HENT:  Head: Normocephalic and atraumatic.  Right Ear: External ear normal.  Ecchymoses around the left periorbital area following recent fall.  Associated redness.  Eyes: Pupils are equal, round, and reactive to light. Conjunctivae are normal.  Neck: Normal range of motion. Neck supple. No tracheal deviation present.  Cardiovascular: Normal rate, regular rhythm and normal heart sounds.  Respiratory: Effort normal and breath sounds normal.  GI: Soft. Bowel sounds are normal. She exhibits no distension.  Musculoskeletal: Normal range of motion.  Neurological: She is alert and oriented  to person, place, and time. No cranial nerve deficit.  Skin: Skin is warm. No erythema.  Psychiatric: She has a normal mood and affect. Her behavior is normal.   LABORATORY PANEL:  Female CBC Recent Labs  Lab 01/30/19 0457  WBC 7.6  HGB 12.5  HCT 36.7  PLT 68*   ------------------------------------------------------------------------------------------------------------------ Chemistries  Recent Labs  Lab 01/28/19 1222  01/30/19 0457 01/31/19 0441  NA 136   < > 137 138  K 3.3*   < > 3.1* 3.8  CL 92*   < > 100 101  CO2 24   < > 27 25  GLUCOSE 185*   < > 126* 126*  BUN 13   < > 10 12  CREATININE 0.78   < > 0.84 0.78  CALCIUM 9.7   < > 9.4 10.1  MG 1.5*   < > 2.3  --   AST 211*  --   --   --   ALT 96*  --   --   --   ALKPHOS 133*  --   --   --   BILITOT 1.9*  --   --   --    < > = values in this interval not displayed.   RADIOLOGY:  No results found. ASSESSMENT AND PLAN:   1. SIRS (systemic inflammatory response syndrome) (HCC) -unclear etiology, but with procalcitonin of less than 0.1 and no obvious source of infection is seen as it is possible that this is due to alcohol withdrawal. So far no growth on cultures.  No evidence of infectious process found.  Antibiotics previously discontinued.  2.Alcohol withdrawal  Clinically improving.  Has not required any additional Ativan  this morning. Continue Ativan as needed, avoid CIWA protocol. Right upper quadrant ultrasound: Fatty infiltration of the liver. No evidence of cholelithiasis or cholecystitis.  3.Fall -unclear etiology, the patient said she fell by accident but no syncope or seizure.  Patient sustained left periorbital ecchymosis and some eye redness following the fall.  To follow-up with primary care physician for ophthalmology referral as outpatient if redness does not clear up following discharge. Echocardiogram is unremarkable.Troponin was normal, and EKG did not show any signs of acute ischemia.  Urine  toxicology is unremarkable. Physical therapist at bedside to evaluate patient this morning.  Would likely benefit from home health services on discharge.  4.HTN (hypertension), she is not on any hypertension medication at home.   Blood pressure currently controlled on metoprolol and Norvasc   5.GERD (gastroesophageal reflux disease) -home dose PPI  6. Hypokalemia.   Replaced previously Severe hypomagnesemia.  Previously replaced  hyponatremia.  Improved with normal saline IV. Tachycardia.  Resolved with IV fluids   7.Thrombocytopenia.  Possible related to alcohol use.  Platelet is 68,000.   CBC in a.m.  DVT prophylaxis;SCD.  No Lovenox or heparin due to thrombocytopenia.  All the records are reviewed and case discussed with Care Management/Social Worker. Management plans discussed with the patient, family and they are in agreement.  CODE STATUS: Full Code  TOTAL TIME TAKING CARE OF THIS PATIENT: 27 minutes.   More than 50% of the time was spent in counseling/coordination of care: YES  POSSIBLE D/C IN 1 DAY, DEPENDING ON CLINICAL CONDITION.   Mavi Un M.D on 02/01/2019 at 11:22 AM  Between 7am to 6pm - Pager - [phone]  After 6pm go to [email or web address] - Scientist, research (life sciences) [place] [facility]  Office  [phone]  CC: Primary care physician; Alba Cory, MD  Note: This dictation was prepared with Dragon dictation along with smaller phrase technology. Any transcriptional errors that result from this process are unintentional.

## 2019-02-01 NOTE — Progress Notes (Signed)
Pts IV removed. Pt wheeled downstairs by staff.

## 2019-02-01 NOTE — Evaluation (Signed)
Physical Therapy Evaluation Patient Details Name: Carla Dickson MRN: [id number] DOB: [birth year] Today's Date: 02/01/2019   History of Present Illness  Carla Dickson is a 48 y.o. female presented to hospital ED on 01/28/2019 via EMS from home following an unwitnessed fall leading to left facial trauma with periorbital swelling and hematoma. On CT imaging she has no acute intracranial abnormality and no bony facial fractures. Upper quadrant ultrasound: Fatty infiltration of the liver. No evidence of cholelithiasis or cholecystitis. She was admitted to the hospital with SIRS, alcohol use, fall, HTN and placed on CIWA protocol. PMH includes HTN, reflux.     Clinical Impression  Patient alert and oriented x 4 and able to provide a detailed history. She lives in a single story level entry apartment with her husband. She uses a tub/shower combo for bathing and has no DME at home. Prior to hospitalization she was independent with ADLs, IADLs except driving and ambulated independent in the home and community without an assistive device. She denies history of seizures. Upon physical therapy evaluation, patient was I for supine <> sit on flat bed, required supervision for STS transfers and basic ambulation without AD. She ambulated a total of 500 feet with a slow self selected pace of 1.16 feet/sec. She required CGA for safety during higher level dynamic balance activities such as backwards walking, tandem walking, head turns while walking, and walking eyes closed. She navigated 8 stairs with supervision and single UE support step over step gait pattern. HR and O2 sat remained WFL throughout session. Patient does demonstrate some ankle strength and LE strength deficits that increase her risk for falls in challenging environments and she would benefit from home health or outpatient physical therapy to improve her strength, activity tolerance, and balance. Patient would benefit from physical therapy to address  impairments and functional limitations to work towards return to PLOF or maximal functional independence.      Follow Up Recommendations Home health PT;Supervision - Intermittent    Equipment Recommendations  None recommended by PT    Recommendations for Other Services       Precautions / Restrictions Precautions Precautions: Fall Precaution Comments: CIWA Restrictions Weight Bearing Restrictions: No      Mobility  Bed Mobility Overal bed mobility: Independent             General bed mobility comments: sit <> supine from flat bed I  Transfers Overall transfer level: Needs assistance Equipment used: None Transfers: Sit to/from Stand Sit to Stand: Supervision         General transfer comment: completed STS transfer with no AD with supervision for safety. Bilateral hip ER turns feet out making less stable.   Ambulation/Gait Ambulation/Gait assistance: Supervision;Min guard Gait Distance (Feet): 500 Feet Assistive device: None Gait Pattern/deviations: Step-through pattern;Decreased step length - right;Decreased step length - left;Decreased stride length;Wide base of support Gait velocity: 1.16 feet/sec Gait velocity interpretation: 1.31 - 2.62 ft/sec, indicative of limited community ambulator General Gait Details: circumducted nursing station x 3 plus ambulates to and from stairs; patient ambulates slowy, with B LE rotated outwards; reduced heel strike. No LOB with standard ambulation. Required CGA for safety for more challenging gait activities including horizontal and vertical head turns, tandem walking (limited ability to achieve tandem position but able to maintain balance with step strategy), backwards walking, walking eyes closed.   Stairs Stairs: Yes Stairs assistance: Modified independent (Device/Increase time);Supervision Stair Management: One rail Left Number of Stairs: 8 General stair comments: step over step  descent with rail; step over step ascent no  rail for 6 steps, then grabbed rail for stability.   Wheelchair Mobility    Modified Rankin (Stroke Patients Only)       Balance Overall balance assessment: Needs assistance Sitting-balance support: No upper extremity supported Sitting balance-Leahy Scale: Good     Standing balance support: No upper extremity supported Standing balance-Leahy Scale: Good               High level balance activites: Head turns High Level Balance Comments: No LOB with standard ambulation. Required CGA for safety for more challenging gait activities including horizontal and vertical head turns, tandem walking (limited ability to achieve tandem position but able to maintain balance with step strategy), backwards walking, walking eyes closed.              Pertinent Vitals/Pain Pain Assessment: No/denies pain    Home Living Family/patient expects to be discharged to:: Private residence Living Arrangements: Spouse/significant other Available Help at Discharge: Family Type of Home: Apartment Home Access: Level entry     Home Layout: One level Home Equipment: None      Prior Function Level of Independence: Independent         Comments: pt reports that prior to hospitalization, she was I with all mobilty, ADLs, and IADLs.      Hand Dominance   Dominant Hand: Right    Extremity/Trunk Assessment   Upper Extremity Assessment Upper Extremity Assessment: Overall WFL for tasks assessed    Lower Extremity Assessment Lower Extremity Assessment: Generalized weakness(able to toe walk, unable to heel walk; 2/5 weakness in dorsiflexors; B hip ER that rotates feet outward 45 degrees during functional mobility)    Cervical / Trunk Assessment Cervical / Trunk Assessment: Normal  Communication   Communication: HOH  Cognition Arousal/Alertness: Awake/alert Behavior During Therapy: WFL for tasks assessed/performed Overall Cognitive Status: Within Functional Limits for tasks assessed                                  General Comments: A&Ox4      General Comments      Exercises Other Exercises Other Exercises: practiced dynamic balance activities, increased ambulation distance, stairs. discussed safety at home.    Assessment/Plan    PT Assessment Patient needs continued PT services  PT Problem List         PT Treatment Interventions Therapeutic exercise;Gait training;Balance training;Stair training;Neuromuscular re-education;Functional mobility training;Therapeutic activities;Patient/family education    PT Goals (Current goals can be found in the Care Plan section)  Acute Rehab PT Goals Patient Stated Goal: return home PT Goal Formulation: With patient Time For Goal Achievement: 02/15/19 Potential to Achieve Goals: Good    Frequency Min 2X/week   Barriers to discharge        Co-evaluation               AM-PAC PT "6 Clicks" Mobility  Outcome Measure Help needed turning from your back to your side while in a flat bed without using bedrails?: None Help needed moving from lying on your back to sitting on the side of a flat bed without using bedrails?: None Help needed moving to and from a bed to a chair (including a wheelchair)?: A Little Help needed standing up from a chair using your arms (e.g., wheelchair or bedside chair)?: A Little Help needed to walk in hospital room?: A Little Help needed climbing 3-5 steps  with a railing? : A Little 6 Click Score: 20    End of Session Equipment Utilized During Treatment: Gait belt Activity Tolerance: Patient tolerated treatment well Patient left: in chair;with nursing/sitter in room;with chair alarm set;with call bell/phone within reach(no call bell present, chair alarm not plugged into wall - notified nursing who went to retrieve; telesitter in room) Nurse Communication: Mobility status;Other (comment)(lack of call bell/chair alarm connection) PT Visit Diagnosis: Unsteadiness on feet  (R26.81);History of falling (Z91.81);Muscle weakness (generalized) (M62.81)    Time: [id number] PT Time Calculation (min) (ACUTE ONLY): 27 min   Charges:   PT Evaluation $PT Eval Low Complexity: 1 Low PT Treatments $Gait Training: 8-22 mins        Carla MurphySara R. Ilsa Dickson, PT, DPT 02/01/19, 10:06 AM

## 2019-02-02 LAB — CULTURE, BLOOD (ROUTINE X 2)
Culture: NO GROWTH
Culture: NO GROWTH
Special Requests: ADEQUATE
Special Requests: ADEQUATE

## 2019-02-02 NOTE — Discharge Summary (Signed)
[facility] [place] [facility]   PATIENT NAME: Carla Dickson    MR#:  [id number]  DATE OF BIRTH:  [birth year]  DATE OF ADMISSION:  01/28/2019   ADMITTING PHYSICIAN: Oralia Manis, MD  DATE OF DISCHARGE: 02/01/2019  3:35 PM  PRIMARY CARE PHYSICIAN: Alba Cory, MD   ADMISSION DIAGNOSIS:  Cirrhosis (HCC) [K74.60] Contusion of face, initial encounter [S00.83XA] Injury of head, initial encounter [S09.90XA] Sepsis, due to unspecified organism, unspecified whether acute organ dysfunction present (HCC) [A41.9] DISCHARGE DIAGNOSIS:  Principal Problem:   SIRS (systemic inflammatory response syndrome) (HCC) Active Problems:   GERD (gastroesophageal reflux disease)   HTN (hypertension)   HLD (hyperlipidemia)   Alcohol use   Fall  SECONDARY DIAGNOSIS:   Past Medical History:  Diagnosis Date  . Alcohol use   . Fracture of fifth metacarpal bone of left hand with routine healing   . Hypercholesteremia   . Hypertension   . Reflux    HOSPITAL COURSE:  Patient left AGAINST MEDICAL ADVICE   Chief complaint; fall  History of presenting complaint; Carla Dickson  is a 48 y.o. female who presented to the ED after a fall at home.  She was unable to provide much information on her HPI as she does not remember the fall.  Patient's husband states that she woke up this morning and seemed normal.  She went to take a shower and that is when he heard the noise indicating she had fallen.  He found her seemingly confused and called EMS.  On evaluation here in the ED she has significant left facial trauma with periorbital swelling and hematoma.  On CT imaging she has no acute intracranial abnormality and no bony facial fractures.  Her work-up was initially consistent with SIRS, but further work-up does not seem to show any acute infection.  Urinalysis does not look infected, chest x-ray was within normal limits.  She did have a fever while she was here, but her pro  calcitonin is less than 0.1.  Previous alcohol levels have been significantly elevated, while today her alcohol level is negative.  She was empirically covered with antibiotics in the ED and hospitalist were called for admission.   Hospital course ; 1. SIRS (systemic inflammatory response syndrome) (HCC) -unclear etiology, but with procalcitonin of less than 0.1 and no obvious source of infection is seen as it is possible that this is due to alcohol withdrawal. So far no growth on cultures.  No evidence of infectious process found.  Antibiotics previously discontinued.  2.Alcohol withdrawal Clinically improving.  Has not required any additional Ativan  Continue Ativan as needed, avoidCIWA protocol. Right upper quadrant ultrasound: Fatty infiltration of the liver. No evidence of cholelithiasis or cholecystitis.  3.Fall -unclear etiology, the patient said she fell by accident but no syncope or seizure.  Patient sustained left periorbital ecchymosis and some eye redness following the fall.  To follow-up with primary care physician for ophthalmology referral as outpatient if redness does not clear up following discharge.Echocardiogram is unremarkable.Troponin was normal, and EKG did not show any signs of acute ischemia. Urine toxicology is unremarkable. Physical therapist at bedside to evaluate patient .   4.HTN (hypertension), she is not on any hypertension medication at home. Blood pressure currently controlled on metoprolol and Norvasc   5.GERD (gastroesophageal reflux disease) -home dose PPI  6. Hypokalemia.  Replaced previously Severe hypomagnesemia.  Previously replaced  hyponatremia. Improved with normal saline IV. Tachycardia.  Resolved with IV fluids   7.Thrombocytopenia.  Possible related to alcohol use. Platelet is 68,000.    DISCHARGE CONDITIONS:  Patient however left AGAINST MEDICAL ADVICE. CONSULTS OBTAINED:   DRUG ALLERGIES:  No Known Allergies  DISCHARGE MEDICATIONS:   Allergies as of 02/01/2019   No Known Allergies     Medication List    TAKE these medications   metoCLOPramide 5 MG tablet Commonly known as:  Reglan Take 2 tablets (10 mg total) by mouth every 8 (eight) hours as needed for nausea or vomiting.     ASK your doctor about these medications   omeprazole 20 MG capsule Commonly known as:  PRILOSEC Take 20 mg by mouth daily.        DISCHARGE INSTRUCTIONS:   DIET:  Cardiac diet DISCHARGE CONDITION:  Fair ACTIVITY:  Left AGAINST MEDICAL ADVICE OXYGEN:  Home Oxygen: No.  Oxygen Delivery: room air DISCHARGE LOCATION:  home   If you experience worsening of your admission symptoms, develop shortness of breath, life threatening emergency, suicidal or homicidal thoughts you must seek medical attention immediately by calling 911 or calling your MD immediately  if symptoms less severe.  You Must read complete instructions/literature along with all the possible adverse reactions/side effects for all the Medicines you take and that have been prescribed to you. Take any new Medicines after you have completely understood and accpet all the possible adverse reactions/side effects.   Please note  You were cared for by a hospitalist during your hospital stay. If you have any questions about your discharge medications or the care you received while you were in the hospital after you are discharged, you can call the unit and asked to speak with the hospitalist on call if the hospitalist that took care of you is not available. Once you are discharged, your primary care physician will handle any further medical issues. Please note that NO REFILLS for any discharge medications will be authorized once you are discharged, as it is imperative that you return to your primary care physician (or establish a relationship with a primary care physician if you do not have one) for your aftercare needs so that they can reassess your need  for medications and monitor your lab values.    On the day of Discharge:  VITAL SIGNS:  Blood pressure 108/75, pulse 100, temperature 97.6 F (36.4 C), temperature source Oral, resp. rate 17, height 5\' 7"  (1.702 m), weight 59 kg, SpO2 100 %. PHYSICAL EXAMINATION:  GENERAL:  48 y.o.-year-old patient lying in the bed with no acute distress.  EYES: Pupils equal, round, reactive to light and accommodation. No scleral icterus. Extraocular muscles intact.  HEENT: Head atraumatic, normocephalic. Oropharynx and nasopharynx clear.  NECK:  Supple, no jugular venous distention. No thyroid enlargement, no tenderness.  LUNGS: Normal breath sounds bilaterally, no wheezing, rales,rhonchi or crepitation. No use of accessory muscles of respiration.  CARDIOVASCULAR: S1, S2 normal. No murmurs, rubs, or gallops.  ABDOMEN: Soft, non-tender, non-distended. Bowel sounds present. No organomegaly or mass.  EXTREMITIES: No pedal edema, cyanosis, or clubbing.  NEUROLOGIC: Cranial nerves II through XII are intact. Muscle strength 5/5 in all extremities. Sensation intact. Gait not checked.  PSYCHIATRIC: The patient is alert and oriented x 3.  SKIN: No obvious rash, lesion, or ulcer.  DATA REVIEW:   CBC Recent Labs  Lab 01/30/19 0457  WBC 7.6  HGB 12.5  HCT 36.7  PLT 68*    Chemistries  Recent Labs  Lab 01/28/19 1222  01/30/19 0457 01/31/19 0441  NA 136   < >  137 138  K 3.3*   < > 3.1* 3.8  CL 92*   < > 100 101  CO2 24   < > 27 25  GLUCOSE 185*   < > 126* 126*  BUN 13   < > 10 12  CREATININE 0.78   < > 0.84 0.78  CALCIUM 9.7   < > 9.4 10.1  MG 1.5*   < > 2.3  --   AST 211*  --   --   --   ALT 96*  --   --   --   ALKPHOS 133*  --   --   --   BILITOT 1.9*  --   --   --    < > = values in this interval not displayed.     Microbiology Results  Results for orders placed or performed during the hospital encounter of 01/28/19  SARS Coronavirus 2 (CEPHEID- Performed in [facility] hospital lab),  Hosp Order     Status: None   Collection Time: 01/28/19  7:52 PM  Result Value Ref Range Status   SARS Coronavirus 2 NEGATIVE NEGATIVE Final    Comment: (NOTE) If result is NEGATIVE SARS-CoV-2 target nucleic acids are NOT DETECTED. The SARS-CoV-2 RNA is generally detectable in upper and lower  respiratory specimens during the acute phase of infection. The lowest  concentration of SARS-CoV-2 viral copies this assay can detect is 250  copies / mL. A negative result does not preclude SARS-CoV-2 infection  and should not be used as the sole basis for treatment or other  patient management decisions.  A negative result may occur with  improper specimen collection / handling, submission of specimen other  than nasopharyngeal swab, presence of viral mutation(s) within the  areas targeted by this assay, and inadequate number of viral copies  (<250 copies / mL). A negative result must be combined with clinical  observations, patient history, and epidemiological information. If result is POSITIVE SARS-CoV-2 target nucleic acids are DETECTED. The SARS-CoV-2 RNA is generally detectable in upper and lower  respiratory specimens dur ing the acute phase of infection.  Positive  results are indicative of active infection with SARS-CoV-2.  Clinical  correlation with patient history and other diagnostic information is  necessary to determine patient infection status.  Positive results do  not rule out bacterial infection or co-infection with other viruses. If result is PRESUMPTIVE POSTIVE SARS-CoV-2 nucleic acids MAY BE PRESENT.   A presumptive positive result was obtained on the submitted specimen  and confirmed on repeat testing.  While 2019 novel coronavirus  (SARS-CoV-2) nucleic acids may be present in the submitted sample  additional confirmatory testing may be necessary for epidemiological  and / or clinical management purposes  to differentiate between  SARS-CoV-2 and other Sarbecovirus  currently known to infect humans.  If clinically indicated additional testing with an alternate test  methodology [phone]) is advised. The SARS-CoV-2 RNA is generally  detectable in upper and lower respiratory sp ecimens during the acute  phase of infection. The expected result is Negative. Fact Sheet for Patients:  BoilerBrush.com.cy Fact Sheet for Healthcare Providers: https://pope.com/ This test is not yet approved or cleared by the [place] FDA and has been authorized for detection and/or diagnosis of SARS-CoV-2 by FDA under an Emergency Use Authorization (EUA).  This EUA will remain in effect (meaning this test can be used) for the duration of the COVID-19 declaration under Section 564(b)(1) of the Act, 21 U.S.C. section 360bbb-3(b)(1), unless  the authorization is terminated or revoked sooner. Performed at [facility], [address] [place], [place] [address]   Blood culture (routine x 2)     Status: None   Collection Time: 01/28/19  7:52 PM  Result Value Ref Range Status   Specimen Description BLOOD BLOOD RIGHT ARM  Final   Special Requests   Final    BOTTLES DRAWN AEROBIC AND ANAEROBIC Blood Culture adequate volume   Culture   Final    NO GROWTH 5 DAYS Performed at [facility], [address] [place], [place] [address]    Report Status 02/02/2019 FINAL  Final  Blood culture (routine x 2)     Status: None   Collection Time: 01/28/19  7:52 PM  Result Value Ref Range Status   Specimen Description BLOOD LEFT ANTECUBITAL  Final   Special Requests   Final    BOTTLES DRAWN AEROBIC AND ANAEROBIC Blood Culture adequate volume   Culture   Final    NO GROWTH 5 DAYS Performed at [facility], [address] [place], [place] [address]    Report Status 02/02/2019 FINAL  Final    RADIOLOGY:  No results found.   Management plans discussed with the patient, family and they are in agreement.   CODE STATUS: Prior   TOTAL TIME TAKING CARE OF THIS PATIENT: 35 minutes.    Naly Schwanz M.D on 02/02/2019 at 3:19 PM  Between 7am to 6pm - Pager - [phone]  After 6pm go to [email or web address] - Scientist, research (life sciences)password [email or web address]  [facility] [place] [facility]  Office  [phone]  CC: Primary care physician; Alba CorySowles, Krichna, MD   Note: This dictation was prepared with Dragon dictation along with smaller phrase technology. Any transcriptional errors that result from this process are unintentional.

## 2019-10-04 ENCOUNTER — Inpatient Hospital Stay
Admission: EM | Admit: 2019-10-04 | Discharge: 2019-10-09 | DRG: 071 | Discharge status: Against Medical Advice | Payer: Self-pay | Attending: Internal Medicine | Admitting: Internal Medicine

## 2019-10-04 ENCOUNTER — Other Ambulatory Visit: Payer: Self-pay

## 2019-10-04 ENCOUNTER — Emergency Department: Payer: Self-pay

## 2019-10-04 DIAGNOSIS — Z79899 Other long term (current) drug therapy: Secondary | ICD-10-CM

## 2019-10-04 DIAGNOSIS — F17210 Nicotine dependence, cigarettes, uncomplicated: Secondary | ICD-10-CM | POA: Diagnosis present

## 2019-10-04 DIAGNOSIS — F10231 Alcohol dependence with withdrawal delirium: Secondary | ICD-10-CM | POA: Diagnosis present

## 2019-10-04 DIAGNOSIS — R569 Unspecified convulsions: Secondary | ICD-10-CM

## 2019-10-04 DIAGNOSIS — K219 Gastro-esophageal reflux disease without esophagitis: Secondary | ICD-10-CM | POA: Diagnosis present

## 2019-10-04 DIAGNOSIS — Z20822 Contact with and (suspected) exposure to covid-19: Secondary | ICD-10-CM | POA: Diagnosis present

## 2019-10-04 DIAGNOSIS — E876 Hypokalemia: Secondary | ICD-10-CM | POA: Diagnosis present

## 2019-10-04 DIAGNOSIS — Z803 Family history of malignant neoplasm of breast: Secondary | ICD-10-CM

## 2019-10-04 DIAGNOSIS — G9341 Metabolic encephalopathy: Principal | ICD-10-CM | POA: Diagnosis present

## 2019-10-04 DIAGNOSIS — F1020 Alcohol dependence, uncomplicated: Secondary | ICD-10-CM | POA: Diagnosis present

## 2019-10-04 DIAGNOSIS — R6510 Systemic inflammatory response syndrome (SIRS) of non-infectious origin without acute organ dysfunction: Secondary | ICD-10-CM

## 2019-10-04 DIAGNOSIS — E785 Hyperlipidemia, unspecified: Secondary | ICD-10-CM | POA: Diagnosis present

## 2019-10-04 DIAGNOSIS — I10 Essential (primary) hypertension: Secondary | ICD-10-CM | POA: Diagnosis present

## 2019-10-04 DIAGNOSIS — Z9119 Patient's noncompliance with other medical treatment and regimen: Secondary | ICD-10-CM

## 2019-10-04 DIAGNOSIS — B9620 Unspecified Escherichia coli [E. coli] as the cause of diseases classified elsewhere: Secondary | ICD-10-CM | POA: Diagnosis present

## 2019-10-04 DIAGNOSIS — N390 Urinary tract infection, site not specified: Secondary | ICD-10-CM | POA: Diagnosis present

## 2019-10-04 DIAGNOSIS — Z90710 Acquired absence of both cervix and uterus: Secondary | ICD-10-CM

## 2019-10-04 DIAGNOSIS — F10931 Alcohol use, unspecified with withdrawal delirium: Secondary | ICD-10-CM

## 2019-10-04 DIAGNOSIS — G9340 Encephalopathy, unspecified: Secondary | ICD-10-CM | POA: Diagnosis present

## 2019-10-04 DIAGNOSIS — Z8673 Personal history of transient ischemic attack (TIA), and cerebral infarction without residual deficits: Secondary | ICD-10-CM

## 2019-10-04 DIAGNOSIS — E872 Acidosis: Secondary | ICD-10-CM | POA: Diagnosis present

## 2019-10-04 DIAGNOSIS — N179 Acute kidney failure, unspecified: Secondary | ICD-10-CM | POA: Diagnosis present

## 2019-10-04 DIAGNOSIS — K7030 Alcoholic cirrhosis of liver without ascites: Secondary | ICD-10-CM | POA: Diagnosis present

## 2019-10-04 LAB — URINALYSIS, COMPLETE (UACMP) WITH MICROSCOPIC
Bilirubin Urine: NEGATIVE
Glucose, UA: 50 mg/dL — AB
Ketones, ur: 5 mg/dL — AB
Leukocytes,Ua: NEGATIVE
Nitrite: NEGATIVE
Protein, ur: 100 mg/dL — AB
Specific Gravity, Urine: 1.011 (ref 1.005–1.030)
pH: 7 (ref 5.0–8.0)

## 2019-10-04 LAB — CBC WITH DIFFERENTIAL/PLATELET
Abs Immature Granulocytes: 0.05 10*3/uL (ref 0.00–0.07)
Basophils Absolute: 0.1 10*3/uL (ref 0.0–0.1)
Basophils Relative: 0 %
Eosinophils Absolute: 0 10*3/uL (ref 0.0–0.5)
Eosinophils Relative: 0 %
HCT: 38.6 % (ref 36.0–46.0)
Hemoglobin: 13 g/dL (ref 12.0–15.0)
Immature Granulocytes: 0 %
Lymphocytes Relative: 9 %
Lymphs Abs: 1.2 10*3/uL (ref 0.7–4.0)
MCH: 31.6 pg (ref 26.0–34.0)
MCHC: 33.7 g/dL (ref 30.0–36.0)
MCV: 93.7 fL (ref 80.0–100.0)
Monocytes Absolute: 0.4 10*3/uL (ref 0.1–1.0)
Monocytes Relative: 3 %
Neutro Abs: 11.8 10*3/uL — ABNORMAL HIGH (ref 1.7–7.7)
Neutrophils Relative %: 88 %
Platelets: 135 10*3/uL — ABNORMAL LOW (ref 150–400)
RBC: 4.12 MIL/uL (ref 3.87–5.11)
RDW: 15.7 % — ABNORMAL HIGH (ref 11.5–15.5)
WBC: 13.5 10*3/uL — ABNORMAL HIGH (ref 4.0–10.5)
nRBC: 0.1 % (ref 0.0–0.2)

## 2019-10-04 LAB — LACTIC ACID, PLASMA
Lactic Acid, Venous: 5.6 mmol/L (ref 0.5–1.9)
Lactic Acid, Venous: 2.6 mmol/L (ref 0.5–1.9)

## 2019-10-04 LAB — COMPREHENSIVE METABOLIC PANEL
ALT: 67 U/L — ABNORMAL HIGH (ref 0–44)
AST: 159 U/L — ABNORMAL HIGH (ref 15–41)
Albumin: 4.3 g/dL (ref 3.5–5.0)
Alkaline Phosphatase: 133 U/L — ABNORMAL HIGH (ref 38–126)
Anion gap: 19 — ABNORMAL HIGH (ref 5–15)
BUN: 9 mg/dL (ref 6–20)
CO2: 23 mmol/L (ref 22–32)
Calcium: 9.5 mg/dL (ref 8.9–10.3)
Chloride: 94 mmol/L — ABNORMAL LOW (ref 98–111)
Creatinine, Ser: 0.86 mg/dL (ref 0.44–1.00)
GFR calc Af Amer: >60 mL/min (ref >60)
GFR calc non Af Amer: >60 mL/min (ref >60)
Glucose, Bld: 170 mg/dL — ABNORMAL HIGH (ref 70–99)
Potassium: 4.1 mmol/L (ref 3.5–5.1)
Sodium: 136 mmol/L (ref 135–145)
Total Bilirubin: 2 mg/dL — ABNORMAL HIGH (ref 0.3–1.2)
Total Protein: 9.4 g/dL — ABNORMAL HIGH (ref 6.5–8.1)

## 2019-10-04 LAB — RESPIRATORY PANEL BY RT PCR (FLU A&B, COVID)
Influenza A by PCR: NEGATIVE
Influenza B by PCR: NEGATIVE
SARS Coronavirus 2 by RT PCR: NEGATIVE

## 2019-10-04 LAB — PROTIME-INR
INR: 1 (ref 0.8–1.2)
Prothrombin Time: 12.6 seconds (ref 11.4–15.2)

## 2019-10-04 LAB — PROCALCITONIN: Procalcitonin: <0.1 ng/mL

## 2019-10-04 LAB — ETHANOL: Alcohol, Ethyl (B): <10 mg/dL (ref <10)

## 2019-10-04 LAB — TSH: TSH: 0.779 u[IU]/mL (ref 0.350–4.500)

## 2019-10-04 LAB — AMMONIA: Ammonia: 49 umol/L — ABNORMAL HIGH (ref 9–35)

## 2019-10-04 LAB — POC SARS CORONAVIRUS 2 AG: SARS Coronavirus 2 Ag: NEGATIVE

## 2019-10-04 LAB — MAGNESIUM: Magnesium: 1.4 mg/dL — ABNORMAL LOW (ref 1.7–2.4)

## 2019-10-04 MED ORDER — ENOXAPARIN SODIUM 40 MG/0.4ML ~~LOC~~ SOLN: 40.0000 mg | SUBCUTANEOUS | Status: DC

## 2019-10-04 MED ORDER — DEXAMETHASONE SODIUM PHOSPHATE 4 MG/ML IJ SOLN
8.0000 mg | Freq: Four times a day (QID) | INTRAMUSCULAR | Status: AC
  Administered 2019-10-04 – 2019-10-06 (×8): 8 mg via INTRAVENOUS
  Filled 2019-10-04 (×8): qty 2

## 2019-10-04 MED ORDER — LORAZEPAM 2 MG/ML IJ SOLN
2.0000 mg | Freq: Once | INTRAMUSCULAR | Status: AC
  Administered 2019-10-04: 12:00:00 2 mg via INTRAVENOUS

## 2019-10-04 MED ORDER — LACTATED RINGERS IV SOLN: INTRAVENOUS | Status: DC

## 2019-10-04 MED ORDER — LORAZEPAM 2 MG/ML IJ SOLN
2.0000 mg | Freq: Once | INTRAMUSCULAR | Status: AC
  Administered 2019-10-04: 15:00:00 2 mg via INTRAVENOUS
  Filled 2019-10-04: qty 1

## 2019-10-04 MED ORDER — DEXMEDETOMIDINE HCL IN NACL 400 MCG/100ML IV SOLN
0.4000 ug/kg/h | INTRAVENOUS | Status: DC
  Administered 2019-10-04: 12:00:00 0.4 ug/kg/h via INTRAVENOUS
  Administered 2019-10-05 (×2): 1 ug/kg/h via INTRAVENOUS
  Administered 2019-10-05: 0.9 ug/kg/h via INTRAVENOUS
  Administered 2019-10-06: 23:00:00 1.2 ug/kg/h via INTRAVENOUS
  Administered 2019-10-06: 1 ug/kg/h via INTRAVENOUS
  Administered 2019-10-06: 1.2 ug/kg/h via INTRAVENOUS
  Administered 2019-10-07: 0.9 ug/kg/h via INTRAVENOUS
  Administered 2019-10-07: 1.2 ug/kg/h via INTRAVENOUS
  Administered 2019-10-07: 14:00:00 1 ug/kg/h via INTRAVENOUS
  Administered 2019-10-08: 02:00:00 0.9 ug/kg/h via INTRAVENOUS
  Administered 2019-10-08: 0.4 ug/kg/h via INTRAVENOUS
  Filled 2019-10-04 (×15): qty 100

## 2019-10-04 MED ORDER — ACETAMINOPHEN 325 MG PO TABS
650.0000 mg | ORAL_TABLET | ORAL | Status: DC | PRN
  Administered 2019-10-08 – 2019-10-09 (×4): 650 mg via ORAL
  Filled 2019-10-04 (×4): qty 2

## 2019-10-04 MED ORDER — LORAZEPAM 2 MG/ML IJ SOLN
INTRAMUSCULAR | Status: AC
  Filled 2019-10-04: qty 1

## 2019-10-04 MED ORDER — ENOXAPARIN SODIUM 40 MG/0.4ML ~~LOC~~ SOLN
40.0000 mg | SUBCUTANEOUS | Status: DC
  Administered 2019-10-05 – 2019-10-06 (×2): 40 mg via SUBCUTANEOUS
  Filled 2019-10-04 (×2): qty 0.4

## 2019-10-04 MED ORDER — DEXTROSE 5 % IV SOLN
10.0000 mg/kg | Freq: Three times a day (TID) | INTRAVENOUS | Status: DC
  Administered 2019-10-05 – 2019-10-06 (×5): 600 mg via INTRAVENOUS
  Filled 2019-10-04 (×7): qty 12

## 2019-10-04 MED ORDER — SODIUM CHLORIDE 0.9 % IV SOLN
2.0000 g | Freq: Two times a day (BID) | INTRAVENOUS | Status: DC
  Administered 2019-10-04 – 2019-10-08 (×8): 2 g via INTRAVENOUS
  Filled 2019-10-04: qty 2
  Filled 2019-10-04: qty 20
  Filled 2019-10-04: qty 2
  Filled 2019-10-04 (×3): qty 20
  Filled 2019-10-04 (×4): qty 2

## 2019-10-04 MED ORDER — SODIUM CHLORIDE 0.9 % IV SOLN
2.0000 g | Freq: Once | INTRAVENOUS | Status: AC
  Administered 2019-10-04: 2 g via INTRAVENOUS
  Filled 2019-10-04: qty 2

## 2019-10-04 MED ORDER — LORAZEPAM 2 MG/ML IJ SOLN: 1.0000 mg | Freq: Once | INTRAMUSCULAR | Status: DC

## 2019-10-04 MED ORDER — SODIUM CHLORIDE 0.9% FLUSH: 3.0000 mL | INTRAVENOUS | Status: DC | PRN

## 2019-10-04 MED ORDER — LORAZEPAM 2 MG/ML IJ SOLN
2.0000 mg | Freq: Once | INTRAMUSCULAR | Status: AC
  Administered 2019-10-04: 09:00:00 2 mg via INTRAVENOUS

## 2019-10-04 MED ORDER — SODIUM CHLORIDE 0.9% FLUSH
3.0000 mL | Freq: Two times a day (BID) | INTRAVENOUS | Status: DC
  Administered 2019-10-04: 15:00:00 3 mL via INTRAVENOUS

## 2019-10-04 MED ORDER — VANCOMYCIN HCL IN DEXTROSE 1-5 GM/200ML-% IV SOLN
1000.0000 mg | Freq: Two times a day (BID) | INTRAVENOUS | Status: DC
  Administered 2019-10-04 – 2019-10-05 (×2): 1000 mg via INTRAVENOUS
  Filled 2019-10-04 (×3): qty 200

## 2019-10-04 MED ORDER — DEXTROSE 5 % IV SOLN
10.0000 mg/kg | Freq: Once | INTRAVENOUS | Status: AC
  Administered 2019-10-04: 600 mg via INTRAVENOUS
  Filled 2019-10-04: qty 12

## 2019-10-04 MED ORDER — ONDANSETRON HCL 4 MG/2ML IJ SOLN
4.0000 mg | Freq: Four times a day (QID) | INTRAMUSCULAR | Status: DC | PRN
  Administered 2019-10-08 – 2019-10-09 (×2): 4 mg via INTRAVENOUS
  Filled 2019-10-04 (×2): qty 2

## 2019-10-04 MED ORDER — SODIUM CHLORIDE 0.9 % IV BOLUS
1000.0000 mL | Freq: Once | INTRAVENOUS | Status: AC
  Administered 2019-10-04: 11:00:00 1000 mL via INTRAVENOUS

## 2019-10-04 MED ORDER — LORAZEPAM 2 MG/ML IJ SOLN
1.0000 mg | Freq: Once | INTRAMUSCULAR | Status: DC
  Filled 2019-10-04: qty 1

## 2019-10-04 MED ORDER — LORAZEPAM 2 MG/ML IJ SOLN
2.0000 mg | Freq: Once | INTRAMUSCULAR | Status: AC
  Administered 2019-10-04: 11:00:00 2 mg via INTRAVENOUS

## 2019-10-04 MED ORDER — SODIUM CHLORIDE 0.9 % IV SOLN: 250.0000 mL | INTRAVENOUS | Status: DC | PRN

## 2019-10-04 MED ORDER — FAMOTIDINE IN NACL 20-0.9 MG/50ML-% IV SOLN
20.0000 mg | Freq: Two times a day (BID) | INTRAVENOUS | Status: DC
  Administered 2019-10-04 – 2019-10-05 (×4): 20 mg via INTRAVENOUS
  Filled 2019-10-04 (×4): qty 50

## 2019-10-04 MED ORDER — ACETAMINOPHEN 650 MG RE SUPP
975.0000 mg | Freq: Once | RECTAL | Status: AC
  Administered 2019-10-04: 975 mg via RECTAL
  Filled 2019-10-04: qty 1

## 2019-10-04 MED ORDER — THIAMINE HCL 100 MG/ML IJ SOLN
100.0000 mg | Freq: Once | INTRAMUSCULAR | Status: AC
  Administered 2019-10-04: 100 mg via INTRAVENOUS
  Filled 2019-10-04: qty 2

## 2019-10-04 MED ORDER — LORAZEPAM 2 MG/ML IJ SOLN
2.0000 mg | Freq: Once | INTRAMUSCULAR | Status: AC
  Administered 2019-10-04: 13:00:00 2 mg via INTRAVENOUS

## 2019-10-04 MED ORDER — MAGNESIUM SULFATE 2 GM/50ML IV SOLN
2.0000 g | Freq: Once | INTRAVENOUS | Status: AC
  Administered 2019-10-04: 11:00:00 2 g via INTRAVENOUS
  Filled 2019-10-04: qty 50

## 2019-10-04 MED ORDER — VANCOMYCIN HCL IN DEXTROSE 1-5 GM/200ML-% IV SOLN
1000.0000 mg | Freq: Once | INTRAVENOUS | Status: AC
  Administered 2019-10-04: 11:00:00 1000 mg via INTRAVENOUS
  Filled 2019-10-04: qty 200

## 2019-10-04 MED ORDER — SODIUM CHLORIDE 0.9 % IV BOLUS
1000.0000 mL | Freq: Once | INTRAVENOUS | Status: AC
  Administered 2019-10-04: 09:00:00 1000 mL via INTRAVENOUS

## 2019-10-04 NOTE — ED Notes (Signed)
Mitts placed on pt, okay per MD Isaacs.

## 2019-10-04 NOTE — ED Notes (Signed)
Patient resting comfortably at this time, sitter at bedside.

## 2019-10-04 NOTE — ED Notes (Signed)
New bag of precedex started

## 2019-10-04 NOTE — ED Notes (Signed)
Following the NP arrival in pts room pt became more agitated and is trying to get ou tof bed, precedex increased at this time to keep pt comfortable.

## 2019-10-04 NOTE — ED Notes (Signed)
Critical lactic acid of 5.6. MD Isaacs notified.

## 2019-10-04 NOTE — ED Notes (Signed)
Pt was asked if she drinks any alcohol or took any medicine today and she mumbled "yes". Pt appears to answer yes or no to every question. Pt is able to state that she is in the hospital.

## 2019-10-04 NOTE — ED Notes (Signed)
Received call from lab stating blood is hemolyzed, lab states they will send up someone to collect. Lab thanked at this time.

## 2019-10-04 NOTE — ED Notes (Signed)
Pt still sleeping and snoring at this time. Sitter at bedside

## 2019-10-04 NOTE — ED Notes (Signed)
Pt continues to try and get up, precedex increased to 0.6 mcg/kg/ml

## 2019-10-04 NOTE — ED Notes (Signed)
Seizure pads placed at this time

## 2019-10-04 NOTE — ED Notes (Signed)
Pt taken to CT.

## 2019-10-04 NOTE — ED Notes (Signed)
Pt is very restless,trying to climb out of bed and rip out IV's. Reported to [employer].

## 2019-10-04 NOTE — ED Notes (Signed)
Checked and still no antibiotics from pharmacy. Called pharmacy to check on them and they state they will get them up. Antibiotic delayed due to not having them in the pyxis, pharmacy has been notified twice. MD aware

## 2019-10-04 NOTE — ED Notes (Signed)
x2 blood cultures sent to lab.  

## 2019-10-04 NOTE — ED Notes (Signed)
Pt soaked bed. Pt cleaned up and new brief place. External cath replaced. Pt pulled up in bed and resting at this time.

## 2019-10-04 NOTE — ED Notes (Signed)
Pt still very active in bed and not wanting to stay in bed. Sitter at bedside. Pt's medication dosage increased at this time. Will inform MD

## 2019-10-04 NOTE — ED Notes (Signed)
Lab called to add on procalcitonin.  ?

## 2019-10-04 NOTE — ED Notes (Signed)
Carla Dickson at bedside with pt

## 2019-10-04 NOTE — ED Notes (Signed)
Attempted to call report, per ICU charge no beds in icu to take pt.

## 2019-10-04 NOTE — ED Notes (Signed)
Pt had gotten up from bed and was walking down the hall toward CPOD, called pt's name and she kept going. RN Florentina Addison was able to walk pt back to room. Pt had pulled out IV and was bleeding from site. Pt cleaned up and back in bed. Pt is resting. Will start another IV, medications unavailable in pyxis, informed pharmacy tech that we needed them for our sepsis workout.

## 2019-10-04 NOTE — ED Notes (Signed)
Pt continues to be anxious and attempt to climb out of the bed. Precedex started at this time. Sitter at bedside.

## 2019-10-04 NOTE — Sepsis Progress Note (Signed)
Notified bedside nurse of need to administer antibiotics.  

## 2019-10-04 NOTE — ED Notes (Signed)
This tech continues as 1:1 Recruitment consultant. NP at bedside at this time.

## 2019-10-04 NOTE — ED Notes (Signed)
Pt still thrashing around in the bed. Pt's medication increased at this time.

## 2019-10-04 NOTE — ED Provider Notes (Signed)
[facility] Emergency Department Provider Note  ____________________________________________   First MD Initiated Contact with Patient 10/04/19 0825     (approximate)  I have reviewed the triage vital signs and the nursing notes.   HISTORY  Chief Complaint Seizures    HPI Carla Dickson is a 49 y.o. female  With h/o chronic alcoholism, cirrhosis, HTN, GERD, here with seizure activity. Per EMS report, they were called to scene 2/2 witnessed seizure like activity. On their arrival, pt was found confused, tachycardic, disoriented. Unclear how long she had seized. On my assessment, pt answers "yeah" to any questions, is tremulous and unable to provide further history. Per review of records, pt has been seen/hospitalized for alcohol abuse in the past, as well as sepsis.  Level 5 caveat invoked as remainder of history, ROS, and physical exam limited due to patient's AMS.         Past Medical History:  Diagnosis Date  . Alcohol use   . Fracture of fifth metacarpal bone of left hand with routine healing   . Hypercholesteremia   . Hypertension   . Reflux     Patient Active Problem List   Diagnosis Date Noted  . Encephalopathy 10/04/2019  . GERD (gastroesophageal reflux disease) 01/28/2019  . HTN (hypertension) 01/28/2019  . HLD (hyperlipidemia) 01/28/2019  . Alcohol use 01/28/2019  . Fall 01/28/2019  . SIRS (systemic inflammatory response syndrome) (HCC) 01/28/2019    Past Surgical History:  Procedure Laterality Date  . ABDOMINAL HYSTERECTOMY      Prior to Admission medications   Medication Sig Start Date End Date Taking? Authorizing Provider  metoCLOPramide (REGLAN) 5 MG tablet Take 2 tablets (10 mg total) by mouth every 8 (eight) hours as needed for nausea or vomiting. Patient not taking: Reported on 10/04/2019 01/28/19   Menshew, Charlesetta Ivory, PA-C    Allergies Patient has no known allergies.  Family History  Problem Relation Age of  Onset  . Breast cancer Maternal Grandmother     Social History Social History   Tobacco Use  . Smoking status: Current Every Day Smoker    Packs/day: 0.50    Types: Cigarettes  . Smokeless tobacco: Never Used  Substance Use Topics  . Alcohol use: Yes    Alcohol/week: 26.0 standard drinks    Types: 24 Shots of liquor, 2 Cans of beer per week    Comment: 2-3 drinks liquor per day  . Drug use: No    Review of Systems  Review of Systems  Unable to perform ROS: Mental status change     ____________________________________________  PHYSICAL EXAM:      VITAL SIGNS: ED Triage Vitals  Enc Vitals Group     BP 10/04/19 0829 (!) 187/105     Pulse Rate 10/04/19 0829 (!) 120     Resp 10/04/19 0830 (!) 33     Temp 10/04/19 0832 99.9 F (37.7 C)     Temp Source 10/04/19 0832 Oral     SpO2 10/04/19 0830 95 %     Weight 10/04/19 0822 132 lb 4.4 oz (60 kg)     Height 10/04/19 0822 5\' 5"  (1.651 m)     Head Circumference --      Peak Flow --      Pain Score 10/04/19 0821 0     Pain Loc --      Pain Edu? --      Excl. in GC? --      Physical Exam  Vitals and nursing note reviewed.  Constitutional:      General: She is not in acute distress.    Appearance: She is well-developed. She is ill-appearing.  HENT:     Head: Normocephalic and atraumatic.     Comments: Poor dentition. Jaw clenched but able to open on command. No apparent tongue lacerations.    Mouth/Throat:     Mouth: Mucous membranes are dry.  Eyes:     Conjunctiva/sclera: Conjunctivae normal.     Comments: 4 mm reactive, seem to favor Rightward gaze though can cross midline, avoids light exposure  Cardiovascular:     Rate and Rhythm: Regular rhythm. Tachycardia present.     Heart sounds: Normal heart sounds.  Pulmonary:     Effort: Pulmonary effort is normal. Tachypnea present. No respiratory distress.     Breath sounds: No wheezing.  Abdominal:     General: Abdomen is flat. There is no distension.   Musculoskeletal:     Cervical back: Neck supple.     Right lower leg: No edema.     Left lower leg: No edema.  Skin:    General: Skin is warm.     Capillary Refill: Capillary refill takes less than 2 seconds.     Findings: No rash.  Neurological:     Mental Status: She is alert.     Motor: No abnormal muscle tone.     Comments: Face appears symmetric. Eyes favoring R ward gaze but can cross midline. No nystagmus. Blinks to threat bilaterally. Jaw clenched. Noted to move b/l UE and LE, though with significant coarse tremor at rest and with movement. Withdraws to painful stimuli b/l UE and LE. Unable to answer any questions, just answers "yeah." Does not follow commands.       ____________________________________________   LABS (all labs ordered are listed, but only abnormal results are displayed)  Labs Reviewed  CBC WITH DIFFERENTIAL/PLATELET - Abnormal; Notable for the following components:      Result Value   WBC 13.5 (*)    RDW 15.7 (*)    Platelets 135 (*)    Neutro Abs 11.8 (*)    All other components within normal limits  LACTIC ACID, PLASMA - Abnormal; Notable for the following components:   Lactic Acid, Venous 5.6 (*)    All other components within normal limits  AMMONIA - Abnormal; Notable for the following components:   Ammonia 49 (*)    All other components within normal limits  COMPREHENSIVE METABOLIC PANEL - Abnormal; Notable for the following components:   Chloride 94 (*)    Glucose, Bld 170 (*)    Total Protein 9.4 (*)    AST 159 (*)    ALT 67 (*)    Alkaline Phosphatase 133 (*)    Total Bilirubin 2.0 (*)    Anion gap 19 (*)    All other components within normal limits  MAGNESIUM - Abnormal; Notable for the following components:   Magnesium 1.4 (*)    All other components within normal limits  URINALYSIS, COMPLETE (UACMP) WITH MICROSCOPIC - Abnormal; Notable for the following components:   Color, Urine YELLOW (*)    APPearance CLEAR (*)    Glucose,  UA 50 (*)    Hgb urine dipstick SMALL (*)    Ketones, ur 5 (*)    Protein, ur 100 (*)    Bacteria, UA MANY (*)    All other components within normal limits  LACTIC ACID, PLASMA - Abnormal; Notable for the  following components:   Lactic Acid, Venous 2.6 (*)    All other components within normal limits  RESPIRATORY PANEL BY RT PCR (FLU A&B, COVID)  CULTURE, BLOOD (ROUTINE X 2)  CULTURE, BLOOD (ROUTINE X 2)  URINE CULTURE  TSH  ETHANOL  PROTIME-INR  PROCALCITONIN  CBC  BASIC METABOLIC PANEL  BLOOD GAS, ARTERIAL  CBG MONITORING, ED  POC SARS CORONAVIRUS 2 AG    ____________________________________________  EKG: Sinus tachycardia, VR 123. Significant baseline tremor limits interpretation though no overt ST elevations or depressions. ________________________________________  RADIOLOGY All imaging, including plain films, CT scans, and ultrasounds, independently reviewed by me, and interpretations confirmed via formal radiology reads.  ED MD interpretation:   CT Head: NAICA CXR: Clear, no acute process  Official radiology report(s): CT Head Wo Contrast  Result Date: 10/04/2019 CLINICAL DATA:  Cerebral hemorrhage suspected EXAM: CT HEAD WITHOUT CONTRAST TECHNIQUE: Contiguous axial images were obtained from the base of the skull through the vertex without intravenous contrast. COMPARISON:  Head CT 01/28/2019 FINDINGS: Brain: Encephalomalacia in the high RIGHT parietal lobe unchanged from prior consistent remote infarction. No acute intracranial hemorrhage. No focal mass lesion. No CT evidence of acute infarction. No midline shift or mass effect. No hydrocephalus. Basilar cisterns are patent. Vascular: No hyperdense vessel or unexpected calcification. Skull: Normal. Negative for fracture or focal lesion. Sinuses/Orbits: Paranasal sinuses and mastoid air cells are clear. Orbits are clear. Other: None. IMPRESSION: 1. No acute intracranial findings. 2. Remote RIGHT parietal infarction.  Electronically Signed   By: Suzy Bouchard M.D.   On: 10/04/2019 08:59   DG Chest Portable 1 View  Result Date: 10/04/2019 CLINICAL DATA:  Sepsis EXAM: PORTABLE CHEST 1 VIEW COMPARISON:  01/28/2019 FINDINGS: The heart size and mediastinal contours are within normal limits. Both lungs are clear. No pleural effusion or pneumothorax. The visualized skeletal structures are unremarkable. IMPRESSION: No acute process in the chest. Electronically Signed   By: Macy Mis M.D.   On: 10/04/2019 10:35    ____________________________________________  PROCEDURES   Procedure(s) performed (including Critical Care):  .Critical Care Performed by: Duffy Bruce, MD Authorized by: Duffy Bruce, MD   Critical care provider statement:    Critical care time (minutes):  75   Critical care time was exclusive of:  Separately billable procedures and treating other patients and teaching time   Critical care was necessary to treat or prevent imminent or life-threatening deterioration of the following conditions:  Cardiac failure, circulatory failure and sepsis   Critical care was time spent personally by me on the following activities:  Development of treatment plan with patient or surrogate, discussions with consultants, evaluation of patient's response to treatment, examination of patient, obtaining history from patient or surrogate, ordering and performing treatments and interventions, ordering and review of laboratory studies, ordering and review of radiographic studies, pulse oximetry, re-evaluation of patient's condition and review of old charts   I assumed direction of critical care for this patient from another provider in my specialty: no      ____________________________________________  INITIAL IMPRESSION / MDM / [place] / ED COURSE  As part of my medical decision making, I reviewed the following data within the [place] notes reviewed and incorporated,  Old chart reviewed, Notes from prior ED visits, and [place]rolled Substance [place] was evaluated in Emergency Department on 10/04/2019 for the symptoms described in the history of present illness. She was evaluated in  the context of the global COVID-19 pandemic, which necessitated consideration that the patient might be at risk for infection with the SARS-CoV-2 virus that causes COVID-19. Institutional protocols and algorithms that pertain to the evaluation of patients at risk for COVID-19 are in a state of rapid change based on information released by regulatory bodies including the CDC and federal and state organizations. These policies and algorithms were followed during the patient's care in the ED.  Some ED evaluations and interventions may be delayed as a result of limited staffing during the pandemic.*  Clinical Course as of Oct 03 1749  Thu Oct 04, 2019  1037 Labs, imaging reviewed. Pt febrile, tachycardic, hypertensive. Suspect sepsis, unclear source. No apparent neck stiffness, headache to suggest meningitis. COVID pending. DDx includes severe EtOH w/d with autonomic instability. Suspect lactic acidosis is 2/2 seizure, alcohol abuse/liver disease, though fluids given as well for possible sepsis. LFTs c/w severe alcohol use. Leukocytosis likely reactive. Broad spectrum ABX given.   [CI]  1124 Called spouse, Onalee Hua. Started acting oddly yesterday, didn't get back to herself. Early this AM, started shaking. Acted like she couldn't hear or understand as much. Did not c/o headache at all.   [CI]  1201 UA without overt UTI. Pt improving clinically but remains tachycardic, tremulous after 6 mg IV Ativan. WIll place on Precedex. COVID PCR is pending. At this time, I suspect her SIRS/fever is 2/2 complicated EtOH w/d given absence of any headache or preceding neuro sx per husband, but will f/u PCR, consider LP if unremarkable though this will certainly require increased  sedation.   [CI]  1607 Clinically improving. Admit to ICU.   [CI]    Clinical Course User Index [CI] Shaune Pollack, MD    Medical Decision Making:  49 yo F here with suspected complicated EtOH w/d. Unlikely meningitis and pt is being treated empirically - feel risks of intubating for LP outweigh benefits at this time. Admit to ICU.  ____________________________________________  FINAL CLINICAL IMPRESSION(S) / ED DIAGNOSES  Final diagnoses:  Seizure (HCC)  Alcohol withdrawal syndrome, with delirium (HCC)  SIRS (systemic inflammatory response syndrome) (HCC)     MEDICATIONS GIVEN DURING THIS VISIT:  Medications  dexmedetomidine (PRECEDEX) 400 MCG/100ML (4 mcg/mL) infusion (0.8 mcg/kg/hr  60 kg Intravenous Rate/Dose Change 10/04/19 1650)  sodium chloride flush (NS) 0.9 % injection 3 mL (3 mLs Intravenous Given 10/04/19 1509)  sodium chloride flush (NS) 0.9 % injection 3 mL (has no administration in time range)  0.9 %  sodium chloride infusion (has no administration in time range)  acetaminophen (TYLENOL) tablet 650 mg (has no administration in time range)  ondansetron (ZOFRAN) injection 4 mg (has no administration in time range)  famotidine (PEPCID) IVPB 20 mg premix (0 mg Intravenous Stopped 10/04/19 1647)  LORazepam (ATIVAN) injection 2 mg (2 mg Intravenous Given 10/04/19 0834)  sodium chloride 0.9 % bolus 1,000 mL (0 mLs Intravenous Stopped 10/04/19 1036)  acetaminophen (TYLENOL) suppository 975 mg (975 mg Rectal Given 10/04/19 1046)  sodium chloride 0.9 % bolus 1,000 mL (0 mLs Intravenous Stopped 10/04/19 1214)  ceFEPIme (MAXIPIME) 2 g in sodium chloride 0.9 % 100 mL IVPB (0 g Intravenous Stopped 10/04/19 1151)  vancomycin (VANCOCIN) IVPB 1000 mg/200 mL premix (0 mg Intravenous Stopped 10/04/19 1230)  magnesium sulfate IVPB 2 g 50 mL (0 g Intravenous Stopped 10/04/19 1151)  thiamine (B-1) injection 100 mg (100 mg Intravenous Given 10/04/19 1040)  LORazepam (ATIVAN) injection 2 mg (2 mg  Intravenous Given 10/04/19 1034)  LORazepam (ATIVAN) injection 2 mg (2 mg Intravenous Given 10/04/19 1152)  LORazepam (ATIVAN) injection 2 mg (2 mg Intravenous Given 10/04/19 1226)  LORazepam (ATIVAN) injection 2 mg (2 mg Intravenous Given 10/04/19 1254)  acyclovir (ZOVIRAX) 600 mg in dextrose 5 % 100 mL IVPB (0 mg/kg  60 kg Intravenous Stopped 10/04/19 1647)  LORazepam (ATIVAN) injection 2 mg (2 mg Intravenous Given 10/04/19 1508)     ED Discharge Orders    None       Note:  This document was prepared using Dragon voice recognition software and may include unintentional dictation errors.   Shaune PollackIsaacs, Georgena Weisheit, MD 10/04/19 [phone]

## 2019-10-04 NOTE — ED Triage Notes (Signed)
Pt here via [facility] from home.   Per EMS pt was convulsing at home today and was post ictal and unable to follow commands when they arrived. Pt is able to follow commands now.  Pt does have a history of one seizure a year ago, has had no other seizures since. Pt takes no medication.  EMS VS- bp 110/58, HR 110, cbg 202.

## 2019-10-04 NOTE — Sepsis Progress Note (Signed)
Notified bedside nurse of need to draw repeat lactic acid. 

## 2019-10-04 NOTE — H&P (Signed)
Name: Carla Dickson MRN: [id number] DOB: 1970-11-02     CONSULTATION DATE: 10/04/2019  REFERRING MD : Ellender Hose  CHIEF COMPLAINT: encephalopathy  HISTORY OF PRESENT ILLNESS:   Mrs. Carla Dickson is a 49 year old female with a past medical history notable for chronic alcoholism, cirrhosis, hypertension, GERD, and seizure 1 year ago on no antiepileptics,  who presents to [facility] ED on 10/04/2019 due to witnessed seizure-like activity.  Upon EMS arrival she was found to be post ictal with confusion, disorientation, unable to follow commands, and  Tachycardic.  Upon presentation to the ED she was noted to be febrile, tachycardic, hypertensive, and tremulous.  She had no apparent neck stiffness or headache (patient's spouse Carla Dickson denies she had any complaints of headache recently).  Initial work-up in the ED revealed sodium 136, glucose 170, anion gap 19, magnesium 1.4, alkaline phosphatase 133, AST 159, ALT 67, ammonia 49, lactic acid 5.6, WBC 13.5, negative procalcitonin.  Her COVID-19 PCR is negative, influenza PCR is negative.  Chest x-ray is negative for any acute disease, urinalysis is negative for UTI.  She met sepsis criteria therefore she received IV fluids and broad-spectrum antibiotics.  Concern for also alcohol withdrawal therefore she received several rounds of Ativan with persistent agitation and restlessness, ultimately requiring Precedex drip.  PCCM is asked to admit the patient for further work-up and treatment of acute seizure activity and acute metabolic encephalopathy in the setting of alcohol withdrawal and questionable meningitis.    ER course Patient was given several rounds of ativan Patient very agitated and restless COVID NEG SEVERE ACIDOSIS +FEBRILE EPISODES Patient given ABX for Encephalitis and meningitis Unable to get LP Placed on PRECEDEX infusion      STUDIES/SIGNIFICANT EVENTS: 1/7-CT head without contrast>>1. No acute intracranial findings. 2. Remote RIGHT  parietal infarction. 1/7-chest x-ray>>No acute process in the chest.  CULTURES: SARS-CoV-2 PCR 1/7>> negative Influenza PCR 1/7>> negative Blood culture x2 1/7>> Urine 1/7>>  ANTIBIOTICS: Acyclovir 1/7>> Cefepime x1 dose in ED 1/7 Ceftriaxone 1/7>> Vancomycin 1/7>>  PAST MEDICAL HISTORY :   has a past medical history of Alcohol use, Fracture of fifth metacarpal bone of left hand with routine healing, Hypercholesteremia, Hypertension, and Reflux.  has a past surgical history that includes Abdominal hysterectomy. Prior to Admission medications   Medication Sig Start Date End Date Taking? Authorizing Provider  metoCLOPramide (REGLAN) 5 MG tablet Take 2 tablets (10 mg total) by mouth every 8 (eight) hours as needed for nausea or vomiting. Patient not taking: Reported on 10/04/2019 01/28/19   Menshew, Dannielle Karvonen, PA-C   No Known Allergies  FAMILY HISTORY:  family history includes Breast cancer in her maternal grandmother. SOCIAL HISTORY:  reports that she has been smoking cigarettes. She has been smoking about 0.50 packs per day. She has never used smokeless tobacco. She reports current alcohol use of about 26.0 standard drinks of alcohol per week. She reports that she does not use drugs.  REVIEW OF SYSTEMS:   Unable to obtain due to critical illness   VITAL SIGNS: Temp:  [99.6 F (37.6 C)-101.1 F (38.4 C)] 99.6 F (37.6 C) (01/07 1204) Pulse Rate:  [94-144] 118 (01/07 1647) Resp:  [18-36] 23 (01/07 1407) BP: (142-190)/(85-116) 142/92 (01/07 1630) SpO2:  [95 %-100 %] 100 % (01/07 1647) Weight:  [60 kg] 60 kg (01/07 0822)   No intake/output data recorded. Total I/O In: 1000 [IV Piggyback:1000] Out: -    SpO2: 100 %   Physical Examination:  GENERAL:critically ill appearing female, laying  in bed, sedated on Precedex, maintaining her airway, no acute distress HEAD: Normocephalic, atraumatic.  EYES: Pupils equal, round, reactive to light.  No scleral icterus.  MOUTH:  Moist mucosal membrane. NECK: Supple. No JVD.  PULMONARY: Clear diminished to auscultation bilaterally, even, nonlabored, normal effort, maintaining airway CARDIOVASCULAR: S1 and S2. Regular rate and rhythm. No murmurs, rubs, or gallops.  GASTROINTESTINAL: Soft, nontender, -distended.  Positive bowel sounds.  MUSCULOSKELETAL: No swelling, clubbing, or edema.  NEUROLOGIC: Sedated on Precedex, moans, withdraws from pain with purposeful movement to bilateral upper and lower extremities. NO NUCHAL RIGIDITY, NEGATIVE BRUDZINSKI SIGN, NEGATIVE KERNIG SIGN SKIN:intact,warm,dry.  No obvious rashes, lesions, or ulcerations  I personally reviewed lab work that was obtained in last 24 hrs. CXR Independently reviewed-no pneumonia  MEDICATIONS: I have reviewed all medications and confirmed regimen as documented   CULTURE RESULTS   Recent Results (from the past 240 hour(s))  Respiratory Panel by RT PCR (Flu A&B, Covid) - Nasopharyngeal Swab     Status: None   Collection Time: 10/04/19 11:53 AM   Specimen: Nasopharyngeal Swab  Result Value Ref Range Status   SARS Coronavirus 2 by RT PCR NEGATIVE NEGATIVE Final    Comment: (NOTE) SARS-CoV-2 target nucleic acids are NOT DETECTED. The SARS-CoV-2 RNA is generally detectable in upper respiratoy specimens during the acute phase of infection. The lowest concentration of SARS-CoV-2 viral copies this assay can detect is 131 copies/mL. A negative result does not preclude SARS-Cov-2 infection and should not be used as the sole basis for treatment or other patient management decisions. A negative result may occur with  improper specimen collection/handling, submission of specimen other than nasopharyngeal swab, presence of viral mutation(s) within the areas targeted by this assay, and inadequate number of viral copies (<131 copies/mL). A negative result must be combined with clinical observations, patient history, and epidemiological information.  The expected result is Negative. Fact Sheet for Patients:  PinkCheek.be Fact Sheet for Healthcare Providers:  GravelBags.it This test is not yet ap proved or cleared by the [place] FDA and  has been authorized for detection and/or diagnosis of SARS-CoV-2 by FDA under an Emergency Use Authorization (EUA). This EUA will remain  in effect (meaning this test can be used) for the duration of the COVID-19 declaration under Section 564(b)(1) of the Act, 21 U.S.C. section 360bbb-3(b)(1), unless the authorization is terminated or revoked sooner.    Influenza A by PCR NEGATIVE NEGATIVE Final   Influenza B by PCR NEGATIVE NEGATIVE Final    Comment: (NOTE) The Xpert Xpress SARS-CoV-2/FLU/RSV assay is intended as an aid in  the diagnosis of influenza from Nasopharyngeal swab specimens and  should not be used as a sole basis for treatment. Nasal washings and  aspirates are unacceptable for Xpert Xpress SARS-CoV-2/FLU/RSV  testing. Fact Sheet for Patients: PinkCheek.be Fact Sheet for Healthcare Providers: GravelBags.it This test is not yet approved or cleared by the [place] FDA and  has been authorized for detection and/or diagnosis of SARS-CoV-2 by  FDA under an Emergency Use Authorization (EUA). This EUA will remain  in effect (meaning this test can be used) for the duration of the  Covid-19 declaration under Section 564(b)(1) of the Act, 21  U.S.C. section 360bbb-3(b)(1), unless the authorization is  terminated or revoked. Performed at [facility], [address], [name], [place] [address]           IMAGING    CT Head Wo Contrast  Result Date: 10/04/2019 CLINICAL DATA:  Cerebral hemorrhage  suspected EXAM: CT HEAD WITHOUT CONTRAST TECHNIQUE: Contiguous axial images were obtained from the base of the skull through the vertex without intravenous  contrast. COMPARISON:  Head CT 01/28/2019 FINDINGS: Brain: Encephalomalacia in the high RIGHT parietal lobe unchanged from prior consistent remote infarction. No acute intracranial hemorrhage. No focal mass lesion. No CT evidence of acute infarction. No midline shift or mass effect. No hydrocephalus. Basilar cisterns are patent. Vascular: No hyperdense vessel or unexpected calcification. Skull: Normal. Negative for fracture or focal lesion. Sinuses/Orbits: Paranasal sinuses and mastoid air cells are clear. Orbits are clear. Other: None. IMPRESSION: 1. No acute intracranial findings. 2. Remote RIGHT parietal infarction. Electronically Signed   By: Suzy Bouchard M.D.   On: 10/04/2019 08:59   DG Chest Portable 1 View  Result Date: 10/04/2019 CLINICAL DATA:  Sepsis EXAM: PORTABLE CHEST 1 VIEW COMPARISON:  01/28/2019 FINDINGS: The heart size and mediastinal contours are within normal limits. Both lungs are clear. No pleural effusion or pneumothorax. The visualized skeletal structures are unremarkable. IMPRESSION: No acute process in the chest. Electronically Signed   By: Macy Mis M.D.   On: 10/04/2019 10:35   CBC    Component Value Date/Time   WBC 13.5 (H) 10/04/2019 0827   RBC 4.12 10/04/2019 0827   HGB 13.0 10/04/2019 0827   HGB 14.0 10/22/2014 1128   HCT 38.6 10/04/2019 0827   HCT 43.1 10/22/2014 1128   PLT 135 (L) 10/04/2019 0827   PLT 234 10/22/2014 1128   MCV 93.7 10/04/2019 0827   MCV 92 10/22/2014 1128   MCH 31.6 10/04/2019 0827   MCHC 33.7 10/04/2019 0827   RDW 15.7 (H) 10/04/2019 0827   RDW 14.6 (H) 10/22/2014 1128   LYMPHSABS 1.2 10/04/2019 0827   LYMPHSABS 2.3 06/01/2012 1334   MONOABS 0.4 10/04/2019 0827   MONOABS 0.5 06/01/2012 1334   EOSABS 0.0 10/04/2019 0827   EOSABS 0.2 06/01/2012 1334   BASOSABS 0.1 10/04/2019 0827   BASOSABS 0.1 06/01/2012 1334   BMP Latest Ref Rng & Units 10/04/2019 01/31/2019 01/30/2019  Glucose 70 - 99 mg/dL 170(H) 126(H) 126(H)  BUN 6 - 20 mg/dL  _0 Creatinine 0.44 - 1.00 mg/dL 0.86 0.78 0.84  Sodium 135 - 145 mmol/L 136 138 137  Potassium 3.5 - 5.1 mmol/L 4.1 3.8 3.1(L)  Chloride 98 - 111 mmol/L 94(L) 101 100  CO2 22 - 32 mmol/L _1 Calcium 8.9 - 10.3 mg/dL 9.5 10.1 9.4        ASSESSMENT AND PLAN SYNOPSIS 49 year old African-American female with severe encephalopathy likely related to alcohol withdrawal with febrile episodes with probable underlying encephalitis meningitis  Prognosis is guarded Currently maintaining her airway Patient is a high risk for intubation   NEUROLOGY Severe metabolic encephalopathy in setting of seizure, ETOH withdrawal, and ? Meninginitis/encephalitis -Frequent neuro checks -CIWA protocol -Precedex as needed -Obtain EEG -Seizure precautions -PRN Ativan -Empiric treatment for meningitis encephalitis (Rocephin, Vancomycin, Acyclovir, Decadron) -Unable to obtain LP due to AMS, agitation, restlessness -Consider MRI  -Consider neurology consult   CARDIAC Hx:HTN -ICU monitoring -Maintain MAP >65 -IV fluids  Meets SIRS Criteria Sepsis in setting of questionable Meningitis -Monitor fever curve -Trend WBCs and procalcitonin -Follow cultures as above -Continue empiric coverage for meningitis for now -Chest x-ray with no infiltrates, UA negative for UTI  GI Chronic alcoholism and cirrhosis GERD -N.p.o. for now until mental status improves -Pepcid for stress ulcer prophylaxis -Trend LFTs  NUTRITIONAL STATUS DIET-->TNPO Constipation protocol as indicated  ENDO - will use ICU hypoglycemic\Hyperglycemia protocol if needed    ELECTROLYTES -follow labs as needed -replace as needed -pharmacy consultation and following   DVT/GI PRX ordered TRANSFUSIONS AS NEEDED MONITOR FSBS ASSESS the need for LABS    Overall, patient is critically ill, prognosis is guarded.  high risk for cardiac arrest and death.     Disposition: ICU Goals of care: Full code VTE  prophylaxis: Lovenox subcu Updates: No family available for update 10/04/19     Darel Hong, [facility] Pager: [phone]

## 2019-10-04 NOTE — Progress Notes (Signed)
PHARMACY -  BRIEF ANTIBIOTIC NOTE   Pharmacy has received consult(s) for Cefepime and Vancomycin from an ED provider.  The patient's profile has been reviewed for ht/wt/allergies/indication/available labs.    One time order(s) placed for Cefepime 2g and Vancomycin 1g  Further antibiotics/pharmacy consults should be ordered by admitting physician if indicated.                       Thank you, Foye Deer 10/04/2019  9:40 AM

## 2019-10-04 NOTE — ED Notes (Signed)
This tech at bedside as 1:1 sitter; received report from [place], EDT.

## 2019-10-04 NOTE — ED Notes (Addendum)
Pt assisted to toilet, pt not following command sto pull down pants. Pt peed in pants and continues to be altered. Pt cleaned up and gown placed. Pt placed back in bed.

## 2019-10-04 NOTE — ED Notes (Signed)
Medications sent from pharmacy, will hang at this time

## 2019-10-04 NOTE — ED Notes (Signed)
Blue top and lactic sent to lab

## 2019-10-05 DIAGNOSIS — F10931 Alcohol use, unspecified with withdrawal delirium: Secondary | ICD-10-CM

## 2019-10-05 DIAGNOSIS — G9340 Encephalopathy, unspecified: Secondary | ICD-10-CM

## 2019-10-05 DIAGNOSIS — F10231 Alcohol dependence with withdrawal delirium: Secondary | ICD-10-CM

## 2019-10-05 LAB — COMPREHENSIVE METABOLIC PANEL
ALT: 55 U/L — ABNORMAL HIGH (ref 0–44)
AST: 85 U/L — ABNORMAL HIGH (ref 15–41)
Albumin: 3.9 g/dL (ref 3.5–5.0)
Alkaline Phosphatase: 115 U/L (ref 38–126)
Anion gap: 16 — ABNORMAL HIGH (ref 5–15)
BUN: 9 mg/dL (ref 6–20)
CO2: 24 mmol/L (ref 22–32)
Calcium: 9.7 mg/dL (ref 8.9–10.3)
Chloride: 102 mmol/L (ref 98–111)
Creatinine, Ser: 0.73 mg/dL (ref 0.44–1.00)
GFR calc Af Amer: >60 mL/min (ref >60)
GFR calc non Af Amer: >60 mL/min (ref >60)
Glucose, Bld: 178 mg/dL — ABNORMAL HIGH (ref 70–99)
Potassium: 2.8 mmol/L — ABNORMAL LOW (ref 3.5–5.1)
Sodium: 142 mmol/L (ref 135–145)
Total Bilirubin: 1.6 mg/dL — ABNORMAL HIGH (ref 0.3–1.2)
Total Protein: 9.1 g/dL — ABNORMAL HIGH (ref 6.5–8.1)

## 2019-10-05 LAB — MRSA PCR SCREENING: MRSA by PCR: NEGATIVE

## 2019-10-05 LAB — BLOOD GAS, ARTERIAL
Acid-Base Excess: 3.5 mmol/L — ABNORMAL HIGH (ref 0.0–2.0)
Bicarbonate: 27.8 mmol/L (ref 20.0–28.0)
FIO2: 0.21
O2 Saturation: 96.8 %
Patient temperature: 37
pCO2 arterial: 40 mmHg (ref 32.0–48.0)
pH, Arterial: 7.45 (ref 7.350–7.450)
pO2, Arterial: 84 mmHg (ref 83.0–108.0)

## 2019-10-05 LAB — PROCALCITONIN: Procalcitonin: 0.5 ng/mL

## 2019-10-05 LAB — CBC
HCT: 39.9 % (ref 36.0–46.0)
Hemoglobin: 13.4 g/dL (ref 12.0–15.0)
MCH: 31.4 pg (ref 26.0–34.0)
MCHC: 33.6 g/dL (ref 30.0–36.0)
MCV: 93.4 fL (ref 80.0–100.0)
Platelets: 108 10*3/uL — ABNORMAL LOW (ref 150–400)
RBC: 4.27 MIL/uL (ref 3.87–5.11)
RDW: 15.5 % (ref 11.5–15.5)
WBC: 10.9 10*3/uL — ABNORMAL HIGH (ref 4.0–10.5)
nRBC: 0 % (ref 0.0–0.2)

## 2019-10-05 LAB — MAGNESIUM: Magnesium: 1.8 mg/dL (ref 1.7–2.4)

## 2019-10-05 LAB — GLUCOSE, CAPILLARY: Glucose-Capillary: 153 mg/dL — ABNORMAL HIGH (ref 70–99)

## 2019-10-05 LAB — PHOSPHORUS: Phosphorus: 2.8 mg/dL (ref 2.5–4.6)

## 2019-10-05 LAB — POTASSIUM: Potassium: 3.5 mmol/L (ref 3.5–5.1)

## 2019-10-05 MED ORDER — VANCOMYCIN HCL 750 MG/150ML IV SOLN
750.0000 mg | Freq: Two times a day (BID) | INTRAVENOUS | Status: DC
  Administered 2019-10-05: 23:00:00 750 mg via INTRAVENOUS
  Filled 2019-10-05 (×3): qty 150

## 2019-10-05 MED ORDER — LORAZEPAM 2 MG/ML IJ SOLN
2.0000 mg | INTRAMUSCULAR | Status: DC | PRN
  Administered 2019-10-05 – 2019-10-08 (×5): 2 mg via INTRAVENOUS
  Filled 2019-10-05 (×5): qty 1

## 2019-10-05 MED ORDER — THIAMINE HCL 100 MG/ML IJ SOLN
INTRAVENOUS | Status: DC
  Filled 2019-10-05 (×4): qty 1

## 2019-10-05 MED ORDER — POTASSIUM CHLORIDE 10 MEQ/100ML IV SOLN
10.0000 meq | INTRAVENOUS | Status: AC
  Administered 2019-10-05 (×5): 10 meq via INTRAVENOUS
  Filled 2019-10-05 (×6): qty 100

## 2019-10-05 MED ORDER — HYDRALAZINE HCL 20 MG/ML IJ SOLN
10.0000 mg | Freq: Four times a day (QID) | INTRAMUSCULAR | Status: DC | PRN
  Administered 2019-10-05 – 2019-10-08 (×4): 10 mg via INTRAVENOUS
  Filled 2019-10-05 (×4): qty 1

## 2019-10-05 MED ORDER — CHLORHEXIDINE GLUCONATE CLOTH 2 % EX PADS
6.0000 | MEDICATED_PAD | Freq: Every day | CUTANEOUS | Status: DC
  Administered 2019-10-07 – 2019-10-08 (×2): 6 via TOPICAL

## 2019-10-05 MED ORDER — POTASSIUM CHLORIDE 2 MEQ/ML IV SOLN
INTRAVENOUS | Status: DC
  Filled 2019-10-05 (×2): qty 1000

## 2019-10-05 MED ORDER — MAGNESIUM SULFATE IN D5W 1-5 GM/100ML-% IV SOLN
1.0000 g | Freq: Once | INTRAVENOUS | Status: AC
  Administered 2019-10-05: 17:00:00 1 g via INTRAVENOUS
  Filled 2019-10-05: qty 100

## 2019-10-05 NOTE — Progress Notes (Addendum)
Remained confused and sedated on Precedex all day.Oriented to self and sometimes place. Medicated with Ativan 2mg  x 1 for extreme agitation and once for EEG.  EEG done.

## 2019-10-05 NOTE — Progress Notes (Signed)
eeg completed ° °

## 2019-10-05 NOTE — Progress Notes (Signed)
Pharmacy Antibiotic Note  Carla Dickson is a 49 y.o. female admitted on 10/04/2019 with possible/suspected meningitis.  Pharmacy has been consulted for Acyclovir and Vancomycin dosing.  Plan: Acyclovir 600mg  (10mg /Kg) IV q8hrs Vancomycin 1000mg  IV q12hrs  Height: 5\' 5"  (165.1 cm) Weight: 132 lb 4.4 oz (60 kg) IBW/kg (Calculated) : 57  Temp (24hrs), Avg:100.2 F (37.9 C), Min:99.6 F (37.6 C), Max:101.1 F (38.4 C)  Recent Labs  Lab 10/04/19 0827 10/04/19 1153  WBC 13.5*  --   CREATININE 0.86  --   LATICACIDVEN 5.6* 2.6*    Estimated Creatinine Clearance: 72 mL/min (by C-G formula based on SCr of 0.86 mg/dL).    No Known Allergies  Antimicrobials this admission: Cefepime 1/7 x 1 Rocephin 1/7 x 1 Acyclovir 1/7 >> Vancomycin 1/7 >>  Dose adjustments this admission:   Microbiology results:  BCx:   UCx:    Sputum:    MRSA PCR:   Thank you for allowing pharmacy to be a part of this patient's care.  3/7 A 10/05/2019 3:00 AM

## 2019-10-05 NOTE — Progress Notes (Addendum)
CRITICAL CARE PROGRESS NOTE    Name: Carla Dickson MRN: [id number] DOB: [birth year]     LOS: 1   SUBJECTIVE FINDINGS & SIGNIFICANT EVENTS   Patient description: Carla Dickson is a 49 yo female with a history of alcohol abuse, cirrhosis, HTN, GERD who presented to the ED yesterday for seizure like activity.  She was admitted to the ICU for severe encephalopathy in the setting of seizure, ETOH withdrawal and suspected meningitis and encephalitis.   Lines / Drains: Peripheral IV x2 right and left arm External urinary catheter  Cultures / Sepsis markers: Febrile in past 24 hours, up to 101.23F on admission.  Tachycardia yesterday up to 144, now 75 bpm.  WBC improving from 13.1 yesterday to 10.9 today.  Procalcitonin 0.5 today.  Blood cultures show no growth in past 24 hours.  Unable to obtain LP due to patient agitation.    Anti-infectives: Acyclovir Ceftriaxone Vancomycin Dexamethasone   Protocols / Consultants: CIWA protocol  Tests / Events: 1/7: patient presented to ED following seizure-like activity.  Admitted to ICU for severe encephalopathy associated with ETOH withdrawal and possible meningitis/encephalitis.  Overnight: Patient was agitated at times, requiring 1:1 sitter.  CC Follow up severe encephalopathy  HPI Patient sedated CIWA protocol On precedex   PAST MEDICAL HISTORY   Past Medical History:  Diagnosis Date  . Alcohol use   . Fracture of fifth metacarpal bone of left hand with routine healing   . Hypercholesteremia   . Hypertension   . Reflux      SURGICAL HISTORY   Past Surgical History:  Procedure Laterality Date  . ABDOMINAL HYSTERECTOMY       FAMILY HISTORY   Family History  Problem Relation Age of Onset  . Breast cancer Maternal Grandmother      SOCIAL  HISTORY   Social History   Tobacco Use  . Smoking status: Current Every Day Smoker    Packs/day: 0.50    Types: Cigarettes  . Smokeless tobacco: Never Used  Substance Use Topics  . Alcohol use: Yes    Alcohol/week: 26.0 standard drinks    Types: 24 Shots of liquor, 2 Cans of beer per week    Comment: 2-3 drinks liquor per day  . Drug use: No     MEDICATIONS   Current Medication:  Current Facility-Administered Medications:  .  acetaminophen (TYLENOL) tablet 650 mg, 650 mg, Oral, Q4H PRN, Mortimer Fries, Sandy Blouch, MD .  acyclovir (ZOVIRAX) 600 mg in dextrose 5 % 100 mL IVPB, 10 mg/kg, Intravenous, Q8H, Hall, Scott A, RPH, Stopped at 10/05/19 0359 .  cefTRIAXone (ROCEPHIN) 2 g in sodium chloride 0.9 % 100 mL IVPB, 2 g, Intravenous, Q12H, Bradly Bienenstock, NP, Stopped at 10/05/19 0052 .  Chlorhexidine Gluconate Cloth 2 % PADS 6 each, 6 each, Topical, Daily, Darel Hong D, NP .  dexamethasone (DECADRON) injection 8 mg, 8 mg, Intravenous, Q6H, Darel Hong D, NP, 8 mg at 10/05/19 [phone] .  dexmedetomidine (PRECEDEX) 400 MCG/100ML (4 mcg/mL) infusion, 0.4-1.2 mcg/kg/hr, Intravenous, Titrated, Duffy Bruce, MD, Last Rate: 13.5 mL/hr at 10/05/19 0843, 0.9 mcg/kg/hr at 10/05/19 0843 .  enoxaparin (LOVENOX) injection 40 mg, 40 mg, Subcutaneous, Q24H, Jeidy Hoerner, MD, 40 mg at 10/05/19 0843 .  famotidine (PEPCID) IVPB 20 mg premix, 20 mg, Intravenous, Q12H, Flora Lipps, MD, Stopped at 10/04/19 2346 .  hydrALAZINE (APRESOLINE) injection 10 mg, 10 mg, Intravenous, Q6H PRN, Darel Hong D, NP, 10 mg at 10/05/19 0854 .  lactated ringers infusion, , Intravenous,  Continuous, Judithe Modest, NP, Stopped at 10/05/19 0150 .  LORazepam (ATIVAN) injection 2 mg, 2 mg, Intravenous, Q4H PRN, Harlon Ditty D, NP .  ondansetron [facility]) injection 4 mg, 4 mg, Intravenous, Q6H PRN, Belia Heman, Lunabelle Oatley, MD .  potassium chloride 10 mEq in 100 mL IVPB, 10 mEq, Intravenous, Q1 Hr x 5, Harlon Ditty D, NP, Last  Rate: 100 mL/hr at 10/05/19 0830, 10 mEq at 10/05/19 0830 .  vancomycin (VANCOCIN) IVPB 1000 mg/200 mL premix, 1,000 mg, Intravenous, Q12H, Wayland Denis, RPH, Stopped at 10/05/19 0121    ALLERGIES   Patient has no known allergies.    REVIEW OF SYSTEMS   Unable to obtain due to patient mental status.  PHYSICAL EXAMINATION   Vital Signs: Temp:  [95.9 F (35.5 C)-99.6 F (37.6 C)] 95.9 F (35.5 C) (01/08 0300) Pulse Rate:  [60-144] 75 (01/08 0400) Resp:  [13-36] 13 (01/08 0400) BP: (126-190)/(85-116) 172/106 (01/08 0854) SpO2:  [96 %-100 %] 99 % (01/08 0400)  GENERAL:Patient snoring and sedated.  Does not respond to voice, light touch or noxious stimuli.   HEAD: Normocephalic, atraumatic.  EYES: Pupils equal, round, reactive to light.  No scleral icterus.  MOUTH: Moist mucosal membrane. NECK: Supple. No thyromegaly. No nodules. No JVD.  PULMONARY: Lungs clear to auscultation bilaterally.   CARDIOVASCULAR: S1 and S2. Regular rate and rhythm. No murmurs, rubs, or gallops.  GASTROINTESTINAL: Soft, nontender, non-distended. No masses. Positive bowel sounds. No hepatosplenomegaly.  MUSCULOSKELETAL: No swelling, clubbing, or edema.  NEUROLOGIC: Patient exhibiting intermittent episodes of agitation.  Nurse tech reports alert and oriented to self earlier today.  Now sedated.  PERRL. Negative Kernig and brudzinski sign SKIN:intact,warm,dry   PERTINENT DATA     Infusions: . acyclovir Stopped (10/05/19 0359)  . cefTRIAXone (ROCEPHIN)  IV Stopped (10/05/19 0052)  . dexmedetomidine (PRECEDEX) IV infusion 0.9 mcg/kg/hr (10/05/19 0843)  . famotidine (PEPCID) IV Stopped (10/04/19 2346)  . lactated ringers Stopped (10/05/19 0150)  . potassium chloride 10 mEq (10/05/19 0830)  . vancomycin Stopped (10/05/19 0121)   Scheduled Medications: . Chlorhexidine Gluconate Cloth  6 each Topical Daily  . dexamethasone (DECADRON) injection  8 mg Intravenous Q6H  . enoxaparin (LOVENOX)  injection  40 mg Subcutaneous Q24H   PRN Medications: acetaminophen, hydrALAZINE, LORazepam, ondansetron (ZOFRAN) IV Hemodynamic parameters:   Intake/Output: 01/07 0701 - 01/08 0700 In: 1842.7 [I.V.:339; IV Piggyback:1503.7] Out: -   Ventilator  Settings:     Other Labs:  LAB RESULTS:  Basic Metabolic Panel: Recent Labs  Lab 10/04/19 0827 10/05/19 0428  NA 136 142  K 4.1 2.8*  CL 94* 102  CO2 23 24  GLUCOSE 170* 178*  BUN 9 9  CREATININE 0.86 0.73  CALCIUM 9.5 9.7  MG 1.4*  --    Liver Function Tests: Recent Labs  Lab 10/04/19 0827 10/05/19 0428  AST 159* 85*  ALT 67* 55*  ALKPHOS 133* 115  BILITOT 2.0* 1.6*  PROT 9.4* 9.1*  ALBUMIN 4.3 3.9   No results for input(s): LIPASE, AMYLASE in the last 168 hours. Recent Labs  Lab 10/04/19 0827  AMMONIA 49*   CBC: Recent Labs  Lab 10/04/19 0827 10/05/19 0428  WBC 13.5* 10.9*  NEUTROABS 11.8*  --   HGB 13.0 13.4  HCT 38.6 39.9  MCV 93.7 93.4  PLT 135* 108*   Cardiac Enzymes: No results for input(s): CKTOTAL, CKMB, CKMBINDEX, TROPONINI in the last 168 hours. BNP: Invalid input(s): POCBNP CBG: Recent Labs  Lab 10/05/19 0220  GLUCAP 153*     IMAGING RESULTS:  Imaging: CT Head Wo Contrast  Result Date: 10/04/2019 CLINICAL DATA:  Cerebral hemorrhage suspected EXAM: CT HEAD WITHOUT CONTRAST TECHNIQUE: Contiguous axial images were obtained from the base of the skull through the vertex without intravenous contrast. COMPARISON:  Head CT 01/28/2019 FINDINGS: Brain: Encephalomalacia in the high RIGHT parietal lobe unchanged from prior consistent remote infarction. No acute intracranial hemorrhage. No focal mass lesion. No CT evidence of acute infarction. No midline shift or mass effect. No hydrocephalus. Basilar cisterns are patent. Vascular: No hyperdense vessel or unexpected calcification. Skull: Normal. Negative for fracture or focal lesion. Sinuses/Orbits: Paranasal sinuses and mastoid air cells are  clear. Orbits are clear. Other: None. IMPRESSION: 1. No acute intracranial findings. 2. Remote RIGHT parietal infarction. Electronically Signed   By: Genevive Bi M.D.   On: 10/04/2019 08:59   DG Chest Portable 1 View  Result Date: 10/04/2019 CLINICAL DATA:  Sepsis EXAM: PORTABLE CHEST 1 VIEW COMPARISON:  01/28/2019 FINDINGS: The heart size and mediastinal contours are within normal limits. Both lungs are clear. No pleural effusion or pneumothorax. The visualized skeletal structures are unremarkable. IMPRESSION: No acute process in the chest. Electronically Signed   By: Guadlupe Spanish M.D.   On: 10/04/2019 10:35   @PROBHOSP @   ASSESSMENT AND PLAN    -Multidisciplinary rounds held today  Severe Acute encephalopathy in setting of seizure, ETOH withdrawal, possible meningitis/encephalitis - Fever up to 101.1 on admission.  Patient denied headache.  Husband notes AMS for past couple of days.  History of ETOH abuse. - Unable to obtain LP due to patient agitation.  Blood cultures negative <24 hours. - Patient will continue on meningitis protocol with acyclovir, ceftriazone, vancomycin and dexamethasone.   - Will consult neurology today- appreciate their input.  Will consider possible EEG due to seizure activity yesterday.   - Patient continues to display agitation.  Precedex prn, sitter at bedside.   - CIWA protocol  Hypertension - BP 172/106 this morning.   - Continue hydralazine as needed.  - Will continue to monitor  Pulmonary - Patient currently maintaining airway and maintaining SpO2 98% on room air. - Will continue to monitor and consider oxygen supplementation as needed.    Cirrhosis - LFTs improving slightly from yesterday.  AST 85 today from 159 yesterday.  ALT 67 yesterday to 55 today.  Bilirubin improving from 2.0 yesterday to 1.6 today. - Chronic issue - Will follow CMP    GI/Nutrition GI PROPHYLAXIS as indicated DIET-->Not able to tolerate diet due to sedation  requirements.  Will continue IV fluids for now.   Constipation protocol as indicated  ENDO - ICU hypoglycemic\Hyperglycemia protocol -check FSBS per protocol   ELECTROLYTES  -follow labs as needed- get phosphorous and magnesium today.   -replace as needed -pharmacy consultation Hypokalemia - Potassium 2.8 today down from 4.1 yesterday. - Potassium chloride infusion today.  Will repeat potassium labs   DVT/GI PRX ordered -SCDs  - On lovenox TRANSFUSIONS AS NEEDED    Critical Care Time devoted to patient care services described in this note is 32 minutes.   Overall, patient is critically ill, prognosis is guarded.     , M.D.  [facility]  Medical Director [facility] Medical Director [facility] Cardio-Pulmonary Department

## 2019-10-05 NOTE — Procedures (Signed)
Patient Name: Carla Dickson  MRN: [id number]  Epilepsy Attending: Charlsie Quest  Referring Physician/Provider: Dr Erin Fulling Date: 10/05/2019  Duration: 25.25 mins  Patient history: 49yo F with ams. EEG to evaluate for seizure.  Level of alertness: asleep/sedated  AEDs during EEG study: Lorazepam  Technical aspects: This EEG study was done with scalp electrodes positioned according to the 10-20 International system of electrode placement. Electrical activity was acquired at a sampling rate of 500Hz  and reviewed with a high frequency filter of 70Hz  and a low frequency filter of 1Hz . EEG data were recorded continuously and digitally stored.   DESCRIPTION: EEG showed continuous generalized low amplitude 2-3hz  delta slowing as well as sleep spindles (12-14hz ) maximal frontocentral. Hyperventilation and photic stimulation were not performed.  ABNORMALITY - Continuous slow, generalized  IMPRESSION: This study showed evidence of severe diffuse encephalopathy, non specific to etiology but could be secondary to sedation. No seizures or epileptiform discharges were seen throughout the recording.  Milli Woolridge 

## 2019-10-05 NOTE — Consult Note (Signed)
PHARMACY CONSULT NOTE  Pharmacy Consult for Electrolyte Monitoring and Replacement   Recent Labs: Potassium (mmol/L)  Date Value  10/05/2019 2.8 (L)  10/22/2014 3.3 (L)   Magnesium (mg/dL)  Date Value  54/36/0677 1.4 (L)   Calcium (mg/dL)  Date Value  03/40/3524 9.7   Calcium, Total (mg/dL)  Date Value  81/85/9093 8.6   Albumin (g/dL)  Date Value  08/18/6243 3.9  10/22/2014 4.0   Sodium (mmol/L)  Date Value  10/05/2019 142  10/22/2014 139   Add-On Magnesium: 1.8 mg/dL Add-On Phosphorous:  Follow-Up potassium (12:40): 3.5 mmol/L  Assessment: 49 year old African-American female with severe encephalopathy likely related to alcohol withdrawal with febrile episodes with probable underlying encephalitis meningitis  Goal of Therapy (given critical condition):  Potassium 4.0 - 5.1 mmol/L Magnesium 2.0 - 2.4 mg/dL All other electrolytes WNL  Plan:   Potassium replaced with 50 mEq IV KCl prior to the follow-up level  She is currently receiving a lactated ringers infusion at 50 mL/hr: add 20 mEq KCl per liter and continue at 50 mL/hr  Replace magnesium with an additional 1 gram IV magnesium sulfate  Due to high re-feeding risk follow-up potassium, magnesium and    Lowella Bandy ,PharmD Clinical Pharmacist 10/05/2019 9:13 AM

## 2019-10-05 NOTE — Progress Notes (Signed)
Pharmacy Antibiotic Note  Carla Dickson is a 49 y.o. female with a history of alcohol abuse, cirrhosis, HTN, GERD who presented to the ED yesterday for seizure like activity.  She was admitted to the ICU for severe encephalopathy in the setting of seizure, ETOH withdrawal and suspected meningitis and encephalitis. Pharmacy was consulted for acyclovir and vancomycin dosing.   Plan: 1) continue acyclovir 600mg  (10mg /Kg) IV q8hrs  2) change vancomycin dose to 750mg  IV q12hrs  Goal AUC 400-550 Expected AUC: 506 SCr used: 0.8 (rounded up) T1/2: 10.1h Css (predicted): 30.9/14.5 mcg/mL SCr in am If vancomycin is continued pharmacy will obtain levels after 3 to 4 days of therapy unless renal function becomes unstable  Height: 5\' 5"  (165.1 cm) Weight: 132 lb 4.4 oz (60 kg) IBW/kg (Calculated) : 57  Temp (24hrs), Avg:97.9 F (36.6 C), Min:95.9 F (35.5 C), Max:99.6 F (37.6 C)  Recent Labs  Lab 10/04/19 0827 10/04/19 1153 10/05/19 0428  WBC 13.5*  --  10.9*  CREATININE 0.86  --  0.73  LATICACIDVEN 5.6* 2.6*  --     Estimated Creatinine Clearance: 77.4 mL/min (by C-G formula based on SCr of 0.73 mg/dL).    No Known Allergies  Antimicrobials this admission: Cefepime 1/7 x 1 Rocephin 1/7 >> Acyclovir 1/7 >> Vancomycin 1/7 >>  Microbiology results: 1/7 BCx: NGTD 1/7 UCx: pending  1/7 SARS CoV-2: negative  1/8 MRSA PCR: negative  Thank you for allowing pharmacy to be a part of this patient's care.  3/7 10/05/2019 11:30 AM

## 2019-10-06 ENCOUNTER — Encounter: Payer: Self-pay | Admitting: Internal Medicine

## 2019-10-06 DIAGNOSIS — R569 Unspecified convulsions: Secondary | ICD-10-CM

## 2019-10-06 LAB — RENAL FUNCTION PANEL
Albumin: 3.3 g/dL — ABNORMAL LOW (ref 3.5–5.0)
Anion gap: 22 — ABNORMAL HIGH (ref 5–15)
BUN: 25 mg/dL — ABNORMAL HIGH (ref 6–20)
CO2: 17 mmol/L — ABNORMAL LOW (ref 22–32)
Calcium: 9.6 mg/dL (ref 8.9–10.3)
Chloride: 106 mmol/L (ref 98–111)
Creatinine, Ser: 2.8 mg/dL — ABNORMAL HIGH (ref 0.44–1.00)
GFR calc Af Amer: 22 mL/min — ABNORMAL LOW (ref >60)
GFR calc non Af Amer: 19 mL/min — ABNORMAL LOW (ref >60)
Glucose, Bld: 154 mg/dL — ABNORMAL HIGH (ref 70–99)
Phosphorus: 3.8 mg/dL (ref 2.5–4.6)
Potassium: 3.2 mmol/L — ABNORMAL LOW (ref 3.5–5.1)
Sodium: 145 mmol/L (ref 135–145)

## 2019-10-06 LAB — MAGNESIUM: Magnesium: 2.1 mg/dL (ref 1.7–2.4)

## 2019-10-06 LAB — CBC
HCT: 40.2 % (ref 36.0–46.0)
Hemoglobin: 13.5 g/dL (ref 12.0–15.0)
MCH: 31.4 pg (ref 26.0–34.0)
MCHC: 33.6 g/dL (ref 30.0–36.0)
MCV: 93.5 fL (ref 80.0–100.0)
Platelets: 97 10*3/uL — ABNORMAL LOW (ref 150–400)
RBC: 4.3 MIL/uL (ref 3.87–5.11)
RDW: 15.6 % — ABNORMAL HIGH (ref 11.5–15.5)
WBC: 16.4 10*3/uL — ABNORMAL HIGH (ref 4.0–10.5)
nRBC: 0.1 % (ref 0.0–0.2)

## 2019-10-06 LAB — URINE CULTURE
Culture: 80000 — AB
Special Requests: NORMAL

## 2019-10-06 LAB — PROCALCITONIN: Procalcitonin: 0.73 ng/mL

## 2019-10-06 MED ORDER — VANCOMYCIN VARIABLE DOSE PER UNSTABLE RENAL FUNCTION (PHARMACIST DOSING): Status: DC

## 2019-10-06 MED ORDER — FAMOTIDINE IN NACL 20-0.9 MG/50ML-% IV SOLN
20.0000 mg | INTRAVENOUS | Status: DC
  Administered 2019-10-07: 20 mg via INTRAVENOUS
  Filled 2019-10-06 (×2): qty 50

## 2019-10-06 MED ORDER — ENOXAPARIN SODIUM 30 MG/0.3ML ~~LOC~~ SOLN
30.0000 mg | SUBCUTANEOUS | Status: DC
  Administered 2019-10-07 – 2019-10-08 (×2): 30 mg via SUBCUTANEOUS
  Filled 2019-10-06 (×2): qty 0.3

## 2019-10-06 MED ORDER — POTASSIUM CHLORIDE 10 MEQ/100ML IV SOLN
10.0000 meq | INTRAVENOUS | Status: AC
  Administered 2019-10-06 (×3): 10 meq via INTRAVENOUS
  Filled 2019-10-06 (×3): qty 100

## 2019-10-06 MED ORDER — DEXTROSE 5 % IV SOLN
10.0000 mg/kg | INTRAVENOUS | Status: DC
  Administered 2019-10-07 – 2019-10-08 (×2): 600 mg via INTRAVENOUS
  Filled 2019-10-06 (×2): qty 12

## 2019-10-06 NOTE — Consult Note (Addendum)
Reason for Consult:Seizure Referring Physician: Kasa  CC: Seizure  HPI: Carla Dickson is an 49 y.o. female who due to confusion is unable to provide any history therefore all history obtained from the chart.  Patient with a history of stroke, chronic alcoholism, cirrhosis, HTN, GERD, presenting with seizure activity. Per EMS report, they were called to scene 2/2 witnessed seizure like activity. On their arrival, pt was found confused, tachycardic, disoriented. Unclear how long she had seized. On presentation remained confused.  Noted to have fever of 101 and elevated wbc count.  Started on decadron, acyclovir and antibiotics on 1/7.   Past Medical History:  Diagnosis Date  . Alcohol use   . Fracture of fifth metacarpal bone of left hand with routine healing   . Hypercholesteremia   . Hypertension   . Reflux     Past Surgical History:  Procedure Laterality Date  . ABDOMINAL HYSTERECTOMY      Family History  Problem Relation Age of Onset  . Breast cancer Maternal Grandmother     Social History:  reports that she has been smoking cigarettes. She has been smoking about 0.50 packs per day. She has never used smokeless tobacco. She reports current alcohol use of about 26.0 standard drinks of alcohol per week. She reports that she does not use drugs.  No Known Allergies  Medications:  I have reviewed the patient's current medications. Prior to Admission:  Medications Prior to Admission  Medication Sig Dispense Refill Last Dose  . metoCLOPramide (REGLAN) 5 MG tablet Take 2 tablets (10 mg total) by mouth every 8 (eight) hours as needed for nausea or vomiting. (Patient not taking: Reported on 10/04/2019) 15 tablet 0 Not Taking at Unknown time   Scheduled: . Chlorhexidine Gluconate Cloth  6 each Topical Daily  . dexamethasone (DECADRON) injection  8 mg Intravenous Q6H  . enoxaparin (LOVENOX) injection  40 mg Subcutaneous Q24H    ROS: Unable to provide due to confusion  Physical  Examination: Blood pressure 138/82, pulse (!) 107, temperature (!) 97.5 F (36.4 C), temperature source Oral, resp. rate (!) 22, height 5\' 5"  (1.651 m), weight 57.6 kg, SpO2 98 %.  HEENT-  Normocephalic, no lesions, without obvious abnormality.  Normal external eye and conjunctiva.  Normal TM's bilaterally.  Normal auditory canals and external ears. Normal external nose, mucus membranes and septum.  Normal pharynx. Cardiovascular- S1, S2 normal, pulses palpable throughout   Lungs- chest clear, no wheezing, rales, normal symmetric air entry Abdomen- soft, non-tender; bowel sounds normal; no masses,  no organomegaly Extremities- no edema Lymph-no adenopathy palpable Musculoskeletal-no joint tenderness, deformity or swelling Skin-warm and dry, no hyperpigmentation, vitiligo, or suspicious lesions  Neurological Examination   Mental Status: Alert.  Oriented to name.  Confused.  Able to follow some commands, particularly one step commands.  Unable to follow 3-step commands.  Speech dysarthric but fluent.   Cranial Nerves: II: Blinks to bilateral confrontation, pupils equal, round, reactive to light and accommodation III,IV, VI: ptosis not present, extra-ocular motions intact bilaterally V,VII: smile symmetric VIII: hearing normal bilaterally IX,X: gag reflex present XI: bilateral shoulder shrug XII: midline tongue extension Motor: Moves all extremities against gravity with no focal weakness appreciated.   Sensory: Appreciates light noxious stimuli throughout Deep Tendon Reflexes: Symmetric throughout Plantars: Right: mute   Left: mute Cerebellar: Unable to perform due to ability to follow commands.   Gait: not tested due to safety concerns    Laboratory Studies:   Basic Metabolic Panel: Recent Labs  Lab 10/04/19 0827 10/05/19 0428 10/05/19 1240 10/06/19 0533  NA 136 142  --  145  K 4.1 2.8* 3.5 3.2*  CL 94* 102  --  106  CO2 23 24  --  17*  GLUCOSE 170* 178*  --  154*  BUN 9  9  --  25*  CREATININE 0.86 0.73  --  2.80*  CALCIUM 9.5 9.7  --  9.6  MG 1.4*  --  1.8 2.1  PHOS  --   --  2.8 3.8    Liver Function Tests: Recent Labs  Lab 10/04/19 0827 10/05/19 0428 10/06/19 0533  AST 159* 85*  --   ALT 67* 55*  --   ALKPHOS 133* 115  --   BILITOT 2.0* 1.6*  --   PROT 9.4* 9.1*  --   ALBUMIN 4.3 3.9 3.3*   No results for input(s): LIPASE, AMYLASE in the last 168 hours. Recent Labs  Lab 10/04/19 0827  AMMONIA 49*    CBC: Recent Labs  Lab 10/04/19 0827 10/05/19 0428 10/06/19 0533  WBC 13.5* 10.9* 16.4*  NEUTROABS 11.8*  --   --   HGB 13.0 13.4 13.5  HCT 38.6 39.9 40.2  MCV 93.7 93.4 93.5  PLT 135* 108* 97*    Cardiac Enzymes: No results for input(s): CKTOTAL, CKMB, CKMBINDEX, TROPONINI in the last 168 hours.  BNP: Invalid input(s): POCBNP  CBG: Recent Labs  Lab 10/05/19 0220  GLUCAP 153*    Microbiology: Results for orders placed or performed during the hospital encounter of 10/04/19  Urine culture     Status: Abnormal   Collection Time: 10/04/19  8:27 AM   Specimen: Urine, Clean Catch  Result Value Ref Range Status   Specimen Description   Final    URINE, CLEAN CATCH Performed at [facility], [address], [place], [place] [address]    Special Requests   Final    Normal Performed at [facility], [address] [place], [place] [address]    Culture 80,000 COLONIES/mL ESCHERICHIA COLI (A)  Final   Report Status 10/06/2019 FINAL  Final   Organism ID, Bacteria ESCHERICHIA COLI (A)  Final      Susceptibility   Escherichia coli - MIC*    AMPICILLIN <=2 SENSITIVE Sensitive     CEFAZOLIN <=4 SENSITIVE Sensitive     CEFTRIAXONE <=0.25 SENSITIVE Sensitive     CIPROFLOXACIN <=0.25 SENSITIVE Sensitive     GENTAMICIN <=1 SENSITIVE Sensitive     IMIPENEM <=0.25 SENSITIVE Sensitive     NITROFURANTOIN <=16 SENSITIVE Sensitive     TRIMETH/SULFA <=20 SENSITIVE Sensitive     AMPICILLIN/SULBACTAM <=2 SENSITIVE  Sensitive     PIP/TAZO <=4 SENSITIVE Sensitive     * 80,000 COLONIES/mL ESCHERICHIA COLI  Blood culture (routine x 2)     Status: None (Preliminary result)   Collection Time: 10/04/19  9:19 AM   Specimen: BLOOD LEFT ARM  Result Value Ref Range Status   Specimen Description BLOOD LEFT ARM  Final   Special Requests   Final    BOTTLES DRAWN AEROBIC AND ANAEROBIC Blood Culture adequate volume   Culture   Final    NO GROWTH 2 DAYS Performed at [facility], [address] [place], [place] [address]    Report Status PENDING  Incomplete  Blood culture (routine x 2)     Status: None (Preliminary result)   Collection Time: 10/04/19  9:19 AM   Specimen: BLOOD RIGHT ARM  Result Value  Ref Range Status   Specimen Description BLOOD RIGHT ARM  Final   Special Requests   Final    BOTTLES DRAWN AEROBIC AND ANAEROBIC Blood Culture adequate volume   Culture   Final    NO GROWTH 2 DAYS Performed at [facility], [address], [place], [place] [address]    Report Status PENDING  Incomplete  Respiratory Panel by RT PCR (Flu A&B, Covid) - Nasopharyngeal Swab     Status: None   Collection Time: 10/04/19 11:53 AM   Specimen: Nasopharyngeal Swab  Result Value Ref Range Status   SARS Coronavirus 2 by RT PCR NEGATIVE NEGATIVE Final    Comment: (NOTE) SARS-CoV-2 target nucleic acids are NOT DETECTED. The SARS-CoV-2 RNA is generally detectable in upper respiratoy specimens during the acute phase of infection. The lowest concentration of SARS-CoV-2 viral copies this assay can detect is 131 copies/mL. A negative result does not preclude SARS-Cov-2 infection and should not be used as the sole basis for treatment or other patient management decisions. A negative result may occur with  improper specimen collection/handling, submission of specimen other than nasopharyngeal swab, presence of viral mutation(s) within the areas targeted by this assay, and inadequate number of viral  copies (<131 copies/mL). A negative result must be combined with clinical observations, patient history, and epidemiological information. The expected result is Negative. Fact Sheet for Patients:  https://www.moore.com/ Fact Sheet for Healthcare Providers:  https://www.young.biz/ This test is not yet ap proved or cleared by the [place] FDA and  has been authorized for detection and/or diagnosis of SARS-CoV-2 by FDA under an Emergency Use Authorization (EUA). This EUA will remain  in effect (meaning this test can be used) for the duration of the COVID-19 declaration under Section 564(b)(1) of the Act, 21 U.S.C. section 360bbb-3(b)(1), unless the authorization is terminated or revoked sooner.    Influenza A by PCR NEGATIVE NEGATIVE Final   Influenza B by PCR NEGATIVE NEGATIVE Final    Comment: (NOTE) The Xpert Xpress SARS-CoV-2/FLU/RSV assay is intended as an aid in  the diagnosis of influenza from Nasopharyngeal swab specimens and  should not be used as a sole basis for treatment. Nasal washings and  aspirates are unacceptable for Xpert Xpress SARS-CoV-2/FLU/RSV  testing. Fact Sheet for Patients: https://www.moore.com/ Fact Sheet for Healthcare Providers: https://www.young.biz/ This test is not yet approved or cleared by the [place] FDA and  has been authorized for detection and/or diagnosis of SARS-CoV-2 by  FDA under an Emergency Use Authorization (EUA). This EUA will remain  in effect (meaning this test can be used) for the duration of the  Covid-19 declaration under Section 564(b)(1) of the Act, 21  U.S.C. section 360bbb-3(b)(1), unless the authorization is  terminated or revoked. Performed at [facility], [address] [place], [place] [address]   MRSA PCR Screening     Status: None   Collection Time: 10/05/19  3:02 AM   Specimen: Nasopharyngeal  Result Value Ref Range  Status   MRSA by PCR NEGATIVE NEGATIVE Final    Comment:        The GeneXpert MRSA Assay (FDA approved for NASAL specimens only), is one component of a comprehensive MRSA colonization surveillance program. It is not intended to diagnose MRSA infection nor to guide or monitor treatment for MRSA infections. Performed at [facility], [address] [place], [place] [address]     Coagulation Studies: Recent Labs    10/04/19 1153  LABPROT 12.6  INR 1.0  Urinalysis:  Recent Labs  Lab 10/04/19 0827  COLORURINE YELLOW*  LABSPEC 1.011  PHURINE 7.0  GLUCOSEU 50*  HGBUR SMALL*  BILIRUBINUR NEGATIVE  KETONESUR 5*  PROTEINUR 100*  NITRITE NEGATIVE  LEUKOCYTESUR NEGATIVE    Lipid Panel:     Component Value Date/Time   CHOL 141 10/23/2014 0011   TRIG 295 (H) 10/23/2014 0011   HDL 42 10/23/2014 0011   VLDL 59 (H) 10/23/2014 0011   LDLCALC 40 10/23/2014 0011    HgbA1C: No results found for: HGBA1C  Urine Drug Screen:      Component Value Date/Time   LABOPIA NONE DETECTED 01/28/2019 1952   COCAINSCRNUR NONE DETECTED 01/28/2019 1952   LABBENZ NONE DETECTED 01/28/2019 1952   AMPHETMU NONE DETECTED 01/28/2019 1952   THCU NONE DETECTED 01/28/2019 1952   LABBARB NONE DETECTED 01/28/2019 1952    Alcohol Level:  Recent Labs  Lab 10/04/19 0827  ETH <10    Imaging: EEG  Result Date: 10/05/2019 Charlsie QuestYadav, Priyanka O, MD     10/05/2019  6:06 PM Patient Name: Carla Dickson MRN: [id number] Epilepsy Attending: Charlsie QuestPriyanka O Yadav Referring Physician/Provider: Dr Erin FullingKurian Kasa Date: 10/05/2019 Duration: 25.25 mins Patient history: 49yo F with ams. EEG to evaluate for seizure. Level of alertness: asleep/sedated AEDs during EEG study: Lorazepam Technical aspects: This EEG study was done with scalp electrodes positioned according to the 10-20 International system of electrode placement. Electrical activity was acquired at a sampling rate of 500Hz  and reviewed with a high  frequency filter of 70Hz  and a low frequency filter of 1Hz . EEG data were recorded continuously and digitally stored. DESCRIPTION: EEG showed continuous generalized low amplitude 2-3hz  delta slowing as well as sleep spindles (12-14hz ) maximal frontocentral. Hyperventilation and photic stimulation were not performed. ABNORMALITY - Continuous slow, generalized IMPRESSION: This study showed evidence of severe diffuse encephalopathy, non specific to etiology but could be secondary to sedation. No seizures or epileptiform discharges were seen throughout the recording. Charlsie QuestPriyanka O Yadav   DG Chest Portable 1 View  Result Date: 10/04/2019 CLINICAL DATA:  Sepsis EXAM: PORTABLE CHEST 1 VIEW COMPARISON:  01/28/2019 FINDINGS: The heart size and mediastinal contours are within normal limits. Both lungs are clear. No pleural effusion or pneumothorax. The visualized skeletal structures are unremarkable. IMPRESSION: No acute process in the chest. Electronically Signed   By: Guadlupe SpanishPraneil  Patel M.D.   On: 10/04/2019 10:35     Assessment/Plan: 49 year old female with a history of stroke, chronic alcoholism, cirrhosis, HTN, GERD, presenting with seizure activity. Per EMS report, they were called to scene 2/2 witnessed seizure like activity. On their arrival, pt was found confused, tachycardic, disoriented. Unclear how long she had seized. On presentation remained confused.  Noted to have fever of 101 and elevated wbc count.  Due to agitation LP unable to be performed.  Started on decadron, acyclovir and antibiotics on 1/7.  No further fevers.  No seizure activity noted.   Seizure activity may very well have been related to ETOH withdrawal but with fever and elevated wbc count (which also can b e related to seizure activity) can not rule out infection/meningits.  Patient has now been on antibiotics for 48 hours making LP less helpful in ruling out meningitis.  Initial head CT reviewed and shows no acute changes.  EEG only  significant for slowing.  Recommendations: 1. MRI of the brain without contrast.  Renal function precludes use of contrast at this time.   2. If MRI unremarkable, EEG unremarkable as  well, would consider discontinuing Acyclovir.  IF no worsening noted would continue off. 3. For now would continue use of Rocephin and Vancomycin for CNS coverage.   4. Agree with continued CIWA protocol.   5. Seizure likely provoked.  Anticonvulsant therapy not indicated at this time.  6. Seizure precautions 7. Patient unable to drive, operate heavy machinery, perform activities at heights and participate in water activities until release by outpatient physician.    Thana Farr, MD Neurology [phone] 10/06/2019, 9:22 AM

## 2019-10-06 NOTE — Progress Notes (Signed)
CRITICAL CARE NOTE  CC  follow up delirium agitation  SUBJECTIVE Patient remains critically ill Prognosis is guarded ETOh withdrawal On precedex High risk for intubation and aspiration pneumonia  BP 138/82   Pulse (!) 107   Temp (!) 97.5 F (36.4 C) (Oral)   Resp (!) 22   Ht 5\' 5"  (1.651 m)   Wt 57.6 kg   SpO2 98%   BMI 21.13 kg/m    I/O last 3 completed shifts: In: 2380.8 [I.V.:1322.2; IV Piggyback:1058.6] Out: 400 [Urine:400] No intake/output data recorded.  SpO2: 98 %   SIGNIFICANT EVENTS 1/8 remains in DT's 1/9 remains on precedex    REVIEW OF SYSTEMS  PATIENT IS UNABLE TO PROVIDE COMPLETE REVIEW OF SYSTEMS DUE TO SEVERE CRITICAL ILLNESS   PHYSICAL EXAMINATION:  GENERAL:critically ill appearing HEAD: Normocephalic, atraumatic.  EYES: Pupils equal, round, reactive to light.  No scleral icterus.  MOUTH: Moist mucosal membrane. NECK: Supple.  PULMONARY: +rhonchi, +wheezing CARDIOVASCULAR: S1 and S2. Regular rate and rhythm. No murmurs, rubs, or gallops.  GASTROINTESTINAL: Soft, nontender, -distended.  Positive bowel sounds.   MUSCULOSKELETAL: No swelling, clubbing, or edema.  NEUROLOGIC: lethargic SKIN:intact,warm,dry  MEDICATIONS: I have reviewed all medications and confirmed regimen as documented   CULTURE RESULTS   Recent Results (from the past 240 hour(s))  Urine culture     Status: Abnormal (Preliminary result)   Collection Time: 10/04/19  8:27 AM   Specimen: Urine, Clean Catch  Result Value Ref Range Status   Specimen Description   Final    URINE, CLEAN CATCH Performed at [facility], [address], [place], [place] [address]    Special Requests   Final    Normal Performed at [facility], [address] [place], [place] [address]    Culture (A)  Final    80,000 COLONIES/mL ESCHERICHIA COLI SUSCEPTIBILITIES TO FOLLOW Performed at [facility], [place] [address], [place], [place] [address]    Report Status  PENDING  Incomplete  Blood culture (routine x 2)     Status: None (Preliminary result)   Collection Time: 10/04/19  9:19 AM   Specimen: BLOOD LEFT ARM  Result Value Ref Range Status   Specimen Description BLOOD LEFT ARM  Final   Special Requests   Final    BOTTLES DRAWN AEROBIC AND ANAEROBIC Blood Culture adequate volume   Culture   Final    NO GROWTH < 24 HOURS Performed at [facility], [address] [place], [place] [address]    Report Status PENDING  Incomplete  Blood culture (routine x 2)     Status: None (Preliminary result)   Collection Time: 10/04/19  9:19 AM   Specimen: BLOOD RIGHT ARM  Result Value Ref Range Status   Specimen Description BLOOD RIGHT ARM  Final   Special Requests   Final    BOTTLES DRAWN AEROBIC AND ANAEROBIC Blood Culture adequate volume   Culture   Final    NO GROWTH < 24 HOURS Performed at [facility], [address], [place], [place] [address]    Report Status PENDING  Incomplete  Respiratory Panel by RT PCR (Flu A&B, Covid) - Nasopharyngeal Swab     Status: None   Collection Time: 10/04/19 11:53 AM   Specimen: Nasopharyngeal Swab  Result Value Ref Range Status   SARS Coronavirus 2 by RT PCR NEGATIVE NEGATIVE Final    Comment: (NOTE) SARS-CoV-2 target nucleic acids are NOT DETECTED. The SARS-CoV-2 RNA is generally detectable in  upper respiratoy specimens during the acute phase of infection. The lowest concentration of SARS-CoV-2 viral copies this assay can detect is 131 copies/mL. A negative result does not preclude SARS-Cov-2 infection and should not be used as the sole basis for treatment or other patient management decisions. A negative result may occur with  improper specimen collection/handling, submission of specimen other than nasopharyngeal swab, presence of viral mutation(s) within the areas targeted by this assay, and inadequate number of viral copies (<131 copies/mL). A negative result must be combined with  clinical observations, patient history, and epidemiological information. The expected result is Negative. Fact Sheet for Patients:  https://www.moore.com/ Fact Sheet for Healthcare Providers:  https://www.young.biz/ This test is not yet ap proved or cleared by the [place] FDA and  has been authorized for detection and/or diagnosis of SARS-CoV-2 by FDA under an Emergency Use Authorization (EUA). This EUA will remain  in effect (meaning this test can be used) for the duration of the COVID-19 declaration under Section 564(b)(1) of the Act, 21 U.S.C. section 360bbb-3(b)(1), unless the authorization is terminated or revoked sooner.    Influenza A by PCR NEGATIVE NEGATIVE Final   Influenza B by PCR NEGATIVE NEGATIVE Final    Comment: (NOTE) The Xpert Xpress SARS-CoV-2/FLU/RSV assay is intended as an aid in  the diagnosis of influenza from Nasopharyngeal swab specimens and  should not be used as a sole basis for treatment. Nasal washings and  aspirates are unacceptable for Xpert Xpress SARS-CoV-2/FLU/RSV  testing. Fact Sheet for Patients: https://www.moore.com/ Fact Sheet for Healthcare Providers: https://www.young.biz/ This test is not yet approved or cleared by the [place] FDA and  has been authorized for detection and/or diagnosis of SARS-CoV-2 by  FDA under an Emergency Use Authorization (EUA). This EUA will remain  in effect (meaning this test can be used) for the duration of the  Covid-19 declaration under Section 564(b)(1) of the Act, 21  U.S.C. section 360bbb-3(b)(1), unless the authorization is  terminated or revoked. Performed at [facility], [address] [place], [place] [address]   MRSA PCR Screening     Status: None   Collection Time: 10/05/19  3:02 AM   Specimen: Nasopharyngeal  Result Value Ref Range Status   MRSA by PCR NEGATIVE NEGATIVE Final    Comment:         The GeneXpert MRSA Assay (FDA approved for NASAL specimens only), is one component of a comprehensive MRSA colonization surveillance program. It is not intended to diagnose MRSA infection nor to guide or monitor treatment for MRSA infections. Performed at [facility], [address] [place], [place] [address]           IMAGING    EEG  Result Date: 10/05/2019 Charlsie Quest, MD     10/05/2019  6:06 PM Patient Name: CHRISTELLA APP MRN: [id number] Epilepsy Attending: Charlsie Quest Referring Physician/Provider: Dr Erin Fulling Date: 10/05/2019 Duration: 25.25 mins Patient history: 49yo F with ams. EEG to evaluate for seizure. Level of alertness: asleep/sedated AEDs during EEG study: Lorazepam Technical aspects: This EEG study was done with scalp electrodes positioned according to the 10-20 International system of electrode placement. Electrical activity was acquired at a sampling rate of 500Hz  and reviewed with a high frequency filter of 70Hz  and a low frequency filter of 1Hz . EEG data were recorded continuously and digitally stored. DESCRIPTION: EEG showed continuous generalized low amplitude 2-3hz  delta slowing as well as sleep spindles (12-14hz ) maximal frontocentral. Hyperventilation and photic stimulation were  not performed. ABNORMALITY - Continuous slow, generalized IMPRESSION: This study showed evidence of severe diffuse encephalopathy, non specific to etiology but could be secondary to sedation. No seizures or epileptiform discharges were seen throughout the recording. Priyanka O Yadav        ASSESSMENT AND PLAN SYNOPSIS   SEVERE ALCOHOL WITHDRAWAL -Therapy with Thiamine and MVI -CIWA Protocol -Precedex as needed -High risk for intubation  -high risk for aspiration   NEUROLOGY-encephalopathy Febrile episodes ?meningitis?encphelitis  Continue IV abx   CARDIAC ICU monitoring  ID -continue IV abx as prescibed -follow up cultures  GI GI  PROPHYLAXIS as indicated  NUTRITIONAL STATUS DIET-->NPO Constipation protocol as indicated  ENDO - will use ICU hypoglycemic\Hyperglycemia protocol if indicated   ELECTROLYTES -follow labs as needed -replace as needed -pharmacy consultation and following   DVT/GI PRX ordered TRANSFUSIONS AS NEEDED MONITOR FSBS ASSESS the need for LABS as needed   Critical Care Time devoted to patient care services described in this note is 32 minutes.   Overall, patient is critically ill, prognosis is guarded.    Lucie Leather, M.D.  [facility]  Medical Director [facility] Medical Director [facility] Cardio-Pulmonary Department

## 2019-10-06 NOTE — Consult Note (Signed)
PHARMACY CONSULT NOTE  Pharmacy Consult for Electrolyte Monitoring and Replacement   Recent Labs: Potassium (mmol/L)  Date Value  10/06/2019 3.2 (L)  10/22/2014 3.3 (L)   Magnesium (mg/dL)  Date Value  09/90/6893 2.1   Calcium (mg/dL)  Date Value  40/68/4033 9.6   Calcium, Total (mg/dL)  Date Value  53/31/7409 8.6   Albumin (g/dL)  Date Value  92/78/0044 3.3 (L)  10/22/2014 4.0   Phosphorus (mg/dL)  Date Value  71/58/0638 3.8   Sodium (mmol/L)  Date Value  10/06/2019 145  10/22/2014 139   Add-On Magnesium: 1.8 mg/dL Add-On Phosphorous:  Follow-Up potassium (12:40): 3.5 mmol/L  Assessment: 49 year old African-American female with severe encephalopathy likely related to alcohol withdrawal with febrile episodes with concern for underlying encephalitis meningitis  Goal of Therapy (given critical condition):  Potassium 4.0 - 5.1 mmol/L Magnesium 2.0 - 2.4 mg/dL All other electrolytes WNL  Plan:  Significant worsening of renal function. Despite decrease in potassium on LR w/ K, will replace conservatively with potassium 10 mEq IV x 3 runs. Will follow up electrolytes with am labs.  Pricilla Riffle ,PharmD Clinical Pharmacist 10/06/2019 10:18 AM

## 2019-10-06 NOTE — Progress Notes (Signed)
Pharmacy Antibiotic Note  Carla Dickson is a 49 y.o. female with a history of alcohol abuse, cirrhosis, HTN, GERD who presented to the ED yesterday for seizure like activity.  She was admitted to the ICU for severe encephalopathy in the setting of seizure, ETOH withdrawal and suspected meningitis and encephalitis. Pharmacy was consulted for acyclovir and vancomycin dosing.   Plan: 1) Change acyclovir 600mg  (10mg /Kg) IV q8hrs to q24h for current renal function  2) Will order random vancomycin level approximately 36hr post dose given yesterday at 2230. Will order level with am labs. Will need to dose vanc for estimated trough 15-20 since treating for possible meningitis. With change in renal function, expect dose to be 1250 mg IV q48h.   Height: 5\' 5"  (165.1 cm) Weight: 126 lb 15.8 oz (57.6 kg) IBW/kg (Calculated) : 57  Temp (24hrs), Avg:97.8 F (36.6 C), Min:97.5 F (36.4 C), Max:98.5 F (36.9 C)  Recent Labs  Lab 10/04/19 0827 10/04/19 1153 10/05/19 0428 10/06/19 0533  WBC 13.5*  --  10.9* 16.4*  CREATININE 0.86  --  0.73 2.80*  LATICACIDVEN 5.6* 2.6*  --   --     Estimated Creatinine Clearance: 22.1 mL/min (A) (by C-G formula based on SCr of 2.8 mg/dL (H)).    No Known Allergies  Antimicrobials this admission: Cefepime 1/7 x 1 Rocephin 1/7 >> Acyclovir 1/7 >> Vancomycin 1/7 >>  Microbiology results: 1/7 BCx: NGTD 1/7 UCx: 80,000 E.coli 1/7 SARS CoV-2: negative  1/8 MRSA PCR: negative  Thank you for allowing pharmacy to be a part of this patient's care.  3/7, PharmD 10/06/2019 10:28 AM

## 2019-10-06 NOTE — Progress Notes (Signed)
PHARMACIST - PHYSICIAN COMMUNICATION  CONCERNING:  Enoxaparin (Lovenox) for DVT Prophylaxis    RECOMMENDATION: Patient was prescribed enoxaparin 40mg  q24 hours for VTE prophylaxis.   Filed Weights   10/04/19 0822 10/06/19 0439  Weight: 132 lb 4.4 oz (60 kg) 126 lb 15.8 oz (57.6 kg)    Body mass index is 21.13 kg/m.  Estimated Creatinine Clearance: 22.1 mL/min (A) (by C-G formula based on SCr of 2.8 mg/dL (H)).   Based on [facility] policy patient is candidate for enoxaparin 30mg  every 24 hours based on CrCl <69ml/min  DESCRIPTION: Pharmacy has adjusted enoxaparin dose per [facility] policy.   Patient is now receiving enoxaparin 30mg  every 24 hours.  31m, PharmD Clinical Pharmacist  10/06/2019 10:42 AM

## 2019-10-07 LAB — CBC
HCT: 34.5 % — ABNORMAL LOW (ref 36.0–46.0)
Hemoglobin: 11.6 g/dL — ABNORMAL LOW (ref 12.0–15.0)
MCH: 31.6 pg (ref 26.0–34.0)
MCHC: 33.6 g/dL (ref 30.0–36.0)
MCV: 94 fL (ref 80.0–100.0)
Platelets: 100 10*3/uL — ABNORMAL LOW (ref 150–400)
RBC: 3.67 MIL/uL — ABNORMAL LOW (ref 3.87–5.11)
RDW: 15.9 % — ABNORMAL HIGH (ref 11.5–15.5)
WBC: 13.4 10*3/uL — ABNORMAL HIGH (ref 4.0–10.5)
nRBC: 0.1 % (ref 0.0–0.2)

## 2019-10-07 LAB — BASIC METABOLIC PANEL
Anion gap: 16 — ABNORMAL HIGH (ref 5–15)
BUN: 41 mg/dL — ABNORMAL HIGH (ref 6–20)
CO2: 20 mmol/L — ABNORMAL LOW (ref 22–32)
Calcium: 9.3 mg/dL (ref 8.9–10.3)
Chloride: 108 mmol/L (ref 98–111)
Creatinine, Ser: 3.16 mg/dL — ABNORMAL HIGH (ref 0.44–1.00)
GFR calc Af Amer: 19 mL/min — ABNORMAL LOW (ref >60)
GFR calc non Af Amer: 17 mL/min — ABNORMAL LOW (ref >60)
Glucose, Bld: 105 mg/dL — ABNORMAL HIGH (ref 70–99)
Potassium: 3 mmol/L — ABNORMAL LOW (ref 3.5–5.1)
Sodium: 144 mmol/L (ref 135–145)

## 2019-10-07 LAB — MAGNESIUM: Magnesium: 1.9 mg/dL (ref 1.7–2.4)

## 2019-10-07 LAB — PHOSPHORUS: Phosphorus: 4.5 mg/dL (ref 2.5–4.6)

## 2019-10-07 LAB — VANCOMYCIN, RANDOM: Vancomycin Rm: 28

## 2019-10-07 MED ORDER — MAGNESIUM SULFATE IN D5W 1-5 GM/100ML-% IV SOLN
1.0000 g | Freq: Once | INTRAVENOUS | Status: AC
  Administered 2019-10-07: 1 g via INTRAVENOUS
  Filled 2019-10-07: qty 100

## 2019-10-07 MED ORDER — POTASSIUM CHLORIDE 10 MEQ/100ML IV SOLN
10.0000 meq | INTRAVENOUS | Status: AC
  Administered 2019-10-07 (×5): 10 meq via INTRAVENOUS
  Filled 2019-10-07 (×5): qty 100

## 2019-10-07 NOTE — Progress Notes (Signed)
Pharmacy Antibiotic Note  Carla Dickson is a 49 y.o. female with a history of alcohol abuse, cirrhosis, HTN, GERD who presented to the ED yesterday for seizure like activity.  She was admitted to the ICU for severe encephalopathy in the setting of seizure, ETOH withdrawal and suspected meningitis and encephalitis. Pharmacy was consulted for acyclovir and vancomycin dosing.   Plan: Renal function continues to decline.  1) Continue acyclovir 600mg  (10mg /Kg) IV q24h for current renal function  2) Vanc level 28 today at 0730. Will order repeat level with morning labs and plan to redose once level < 15.   Height: 5\' 5"  (165.1 cm) Weight: 131 lb 2.8 oz (59.5 kg) IBW/kg (Calculated) : 57  Temp (24hrs), Avg:98.2 F (36.8 C), Min:97.7 F (36.5 C), Max:99 F (37.2 C)  Recent Labs  Lab 10/04/19 0827 10/04/19 1153 10/05/19 0428 10/06/19 0533 10/07/19 0727  WBC 13.5*  --  10.9* 16.4* 13.4*  CREATININE 0.86  --  0.73 2.80* 3.16*  LATICACIDVEN 5.6* 2.6*  --   --   --   VANCORANDOM  --   --   --   --  28    Estimated Creatinine Clearance: 19.6 mL/min (A) (by C-G formula based on SCr of 3.16 mg/dL (H)).    No Known Allergies  Antimicrobials this admission: Cefepime 1/7 x 1 Rocephin 1/7 >> Acyclovir 1/7 >> Vancomycin 1/7 >>  Microbiology results: 1/7 BCx: NGTD 1/7 UCx: 80,000 E.coli 1/7 SARS CoV-2: negative  1/8 MRSA PCR: negative  Thank you for allowing pharmacy to be a part of this patient's care.  3/7, PharmD 10/07/2019 9:06 AM

## 2019-10-07 NOTE — Progress Notes (Signed)
CRITICAL CARE NOTE  CC  follow up delirium and agitation  SUBJECTIVE On precedex Less agitated    BP (!) 168/86   Pulse 95   Temp 97.8 F (36.6 C) (Axillary)   Resp 12   Ht 5\' 5"  (1.651 m)   Wt 59.5 kg   SpO2 98%   BMI 21.83 kg/m    I/O last 3 completed shifts: In: 2507.9 [P.O.:120; I.V.:1477; IV Piggyback:910.8] Out: -  No intake/output data recorded.  SpO2: 98 %   SIGNIFICANT EVENTS 1/8 remains in DT's 1/9 remains on precedex   REVIEW OF SYSTEMS  PATIENT IS UNABLE TO PROVIDE COMPLETE REVIEW OF SYSTEMS DUE ETOH WITHDRAWAL  PHYSICAL EXAMINATION:  GENERAL:critically ill appearing,  HEAD: Normocephalic, atraumatic.  EYES: Pupils equal, round, reactive to light.  No scleral icterus.  MOUTH: Moist mucosal membrane. NECK: Supple.  PULMONARY: +rhonchi,  CARDIOVASCULAR: S1 and S2. Regular rate and rhythm. No murmurs, rubs, or gallops.  GASTROINTESTINAL: Soft, nontender, -distended.  Positive bowel sounds.   MUSCULOSKELETAL: No swelling, clubbing, or edema.  NEUROLOGIC: agitated SKIN:intact,warm,dry  MEDICATIONS: I have reviewed all medications and confirmed regimen as documented   CULTURE RESULTS   Recent Results (from the past 240 hour(s))  Urine culture     Status: Abnormal   Collection Time: 10/04/19  8:27 AM   Specimen: Urine, Clean Catch  Result Value Ref Range Status   Specimen Description   Final    URINE, CLEAN CATCH Performed at [facility], [address] [place], [place]    Special Requests   Final    Normal Performed at [facility], [address] [place], [place]    Culture 80,000 COLONIES/mL ESCHERICHIA COLI (A)  Final   Report Status 10/06/2019 FINAL  Final   Organism ID, Bacteria ESCHERICHIA COLI (A)  Final      Susceptibility   Escherichia coli - MIC*    AMPICILLIN <=2 SENSITIVE Sensitive     CEFAZOLIN <=4 SENSITIVE Sensitive     CEFTRIAXONE <=0.25 SENSITIVE Sensitive    CIPROFLOXACIN <=0.25 SENSITIVE Sensitive     GENTAMICIN <=1 SENSITIVE Sensitive     IMIPENEM <=0.25 SENSITIVE Sensitive     NITROFURANTOIN <=16 SENSITIVE Sensitive     TRIMETH/SULFA <=20 SENSITIVE Sensitive     AMPICILLIN/SULBACTAM <=2 SENSITIVE Sensitive     PIP/TAZO <=4 SENSITIVE Sensitive     * 80,000 COLONIES/mL ESCHERICHIA COLI  Blood culture (routine x 2)     Status: None (Preliminary result)   Collection Time: 10/04/19  9:19 AM   Specimen: BLOOD LEFT ARM  Result Value Ref Range Status   Specimen Description BLOOD LEFT ARM  Final   Special Requests   Final    BOTTLES DRAWN AEROBIC AND ANAEROBIC Blood Culture adequate volume   Culture   Final    NO GROWTH 3 DAYS Performed at [facility], [address] [place], [place]    Report Status PENDING  Incomplete  Blood culture (routine x 2)     Status: None (Preliminary result)   Collection Time: 10/04/19  9:19 AM   Specimen: BLOOD RIGHT ARM  Result Value Ref Range Status   Specimen Description BLOOD RIGHT ARM  Final   Special Requests   Final    BOTTLES DRAWN AEROBIC AND ANAEROBIC Blood Culture adequate volume   Culture   Final    NO GROWTH 3 DAYS Performed at [facility], [address], [place], [place]    Report  Status PENDING  Incomplete  Respiratory Panel by RT PCR (Flu A&B, Covid) - Nasopharyngeal Swab     Status: None   Collection Time: 10/04/19 11:53 AM   Specimen: Nasopharyngeal Swab  Result Value Ref Range Status   SARS Coronavirus 2 by RT PCR NEGATIVE NEGATIVE Final    Comment: (NOTE) SARS-CoV-2 target nucleic acids are NOT DETECTED. The SARS-CoV-2 RNA is generally detectable in upper respiratoy specimens during the acute phase of infection. The lowest concentration of SARS-CoV-2 viral copies this assay can detect is 131 copies/mL. A negative result does not preclude SARS-Cov-2 infection and should not be used as the sole basis for treatment or other patient management  decisions. A negative result may occur with  improper specimen collection/handling, submission of specimen other than nasopharyngeal swab, presence of viral mutation(s) within the areas targeted by this assay, and inadequate number of viral copies (<131 copies/mL). A negative result must be combined with clinical observations, patient history, and epidemiological information. The expected result is Negative. Fact Sheet for Patients:  PinkCheek.be Fact Sheet for Healthcare Providers:  GravelBags.it This test is not yet ap proved or cleared by the [place] FDA and  has been authorized for detection and/or diagnosis of SARS-CoV-2 by FDA under an Emergency Use Authorization (EUA). This EUA will remain  in effect (meaning this test can be used) for the duration of the COVID-19 declaration under Section 564(b)(1) of the Act, 21 U.S.C. section 360bbb-3(b)(1), unless the authorization is terminated or revoked sooner.    Influenza A by PCR NEGATIVE NEGATIVE Final   Influenza B by PCR NEGATIVE NEGATIVE Final    Comment: (NOTE) The Xpert Xpress SARS-CoV-2/FLU/RSV assay is intended as an aid in  the diagnosis of influenza from Nasopharyngeal swab specimens and  should not be used as a sole basis for treatment. Nasal washings and  aspirates are unacceptable for Xpert Xpress SARS-CoV-2/FLU/RSV  testing. Fact Sheet for Patients: PinkCheek.be Fact Sheet for Healthcare Providers: GravelBags.it This test is not yet approved or cleared by the [place] FDA and  has been authorized for detection and/or diagnosis of SARS-CoV-2 by  FDA under an Emergency Use Authorization (EUA). This EUA will remain  in effect (meaning this test can be used) for the duration of the  Covid-19 declaration under Section 564(b)(1) of the Act, 21  U.S.C. section 360bbb-3(b)(1), unless the authorization  is  terminated or revoked. Performed at [facility], [address] [place], [place] [address]   MRSA PCR Screening     Status: None   Collection Time: 10/05/19  3:02 AM   Specimen: Nasopharyngeal  Result Value Ref Range Status   MRSA by PCR NEGATIVE NEGATIVE Final    Comment:        The GeneXpert MRSA Assay (FDA approved for NASAL specimens only), is one component of a comprehensive MRSA colonization surveillance program. It is not intended to diagnose MRSA infection nor to guide or monitor treatment for MRSA infections. Performed at [facility], [address] [place], [place] [address]              ASSESSMENT AND PLAN SYNOPSIS   SEVERE ALCOHOL WITHDRAWAL -Therapy with Thiamine and MVI -CIWA Protocol -Precedex as needed    NEUROLOGY Follow up NEURO RECS Consider stopping ABX MRI BRAIN PENDING  CARDIAC ICU monitoring  ID -continue IV abx as prescibed -follow up cultures   ENDO - will use ICU hypoglycemic\Hyperglycemia protocol if indicated   ELECTROLYTES -follow labs as needed -replace as  needed -pharmacy consultation and following   DVT/GI PRX ordered TRANSFUSIONS AS NEEDED MONITOR FSBS ASSESS the need for LABS as needed     Lucie Leather, M.D.  [facility]  Medical Director [facility] Medical Director [facility] Cardio-Pulmonary Department

## 2019-10-07 NOTE — Progress Notes (Signed)
Subjective: No further seizures noted.  Remains agitated.    Objective: Current vital signs: BP (!) 168/86   Pulse 95   Temp 97.8 F (36.6 C) (Axillary)   Resp 12   Ht 5\' 5"  (1.651 m)   Wt 59.5 kg   SpO2 98%   BMI 21.83 kg/m  Vital signs in last 24 hours: Temp:  [97.7 F (36.5 C)-99 F (37.2 C)] 97.8 F (36.6 C) (01/10 0332) Pulse Rate:  [90-128] 95 (01/10 0600) Resp:  [12-25] 12 (01/10 0600) BP: (120-172)/(80-133) 168/86 (01/10 0600) SpO2:  [94 %-100 %] 98 % (01/10 0600) Weight:  [59.5 kg] 59.5 kg (01/10 0357)  Intake/Output from previous day: 01/09 0701 - 01/10 0700 In: 2266 [P.O.:120; I.V.:1235.2; IV Piggyback:910.8] Out: -  Intake/Output this shift: No intake/output data recorded. Nutritional status:  Diet Order            Diet NPO time specified  Diet effective now              Neurologic Exam: Mental Status: Resting when I enter the room but able to be awakened.  Oriented to name but thinks she is at home.  When told she is in the hospital she becomes agitated and wants to go home.  Speech fluent.   Cranial Nerves: II: Discs flat bilaterally; Visual fields grossly normal, pupils equal, round, reactive to light and accommodation III,IV, VI: ptosis not present, extra-ocular motions intact bilaterally V,VII: face symmetric VIII: hearing normal bilaterally IX,X: unable to test XI: unable to test XII: unable to test Motor: Moves all extremities against gravity spontaneously   Lab Results: Basic Metabolic Panel: Recent Labs  Lab 10/04/19 0827 10/05/19 0428 10/05/19 1240 10/06/19 0533 10/07/19 0727  NA 136 142  --  145 144  K 4.1 2.8* 3.5 3.2* 3.0*  CL 94* 102  --  106 108  CO2 23 24  --  17* 20*  GLUCOSE 170* 178*  --  154* 105*  BUN 9 9  --  25* 41*  CREATININE 0.86 0.73  --  2.80* 3.16*  CALCIUM 9.5 9.7  --  9.6 9.3  MG 1.4*  --  1.8 2.1 1.9  PHOS  --   --  2.8 3.8 4.5    Liver Function Tests: Recent Labs  Lab 10/04/19 0827  10/05/19 0428 10/06/19 0533  AST 159* 85*  --   ALT 67* 55*  --   ALKPHOS 133* 115  --   BILITOT 2.0* 1.6*  --   PROT 9.4* 9.1*  --   ALBUMIN 4.3 3.9 3.3*   No results for input(s): LIPASE, AMYLASE in the last 168 hours. Recent Labs  Lab 10/04/19 0827  AMMONIA 49*    CBC: Recent Labs  Lab 10/04/19 0827 10/05/19 0428 10/06/19 0533 10/07/19 0727  WBC 13.5* 10.9* 16.4* 13.4*  NEUTROABS 11.8*  --   --   --   HGB 13.0 13.4 13.5 11.6*  HCT 38.6 39.9 40.2 34.5*  MCV 93.7 93.4 93.5 94.0  PLT 135* 108* 97* 100*    Cardiac Enzymes: No results for input(s): CKTOTAL, CKMB, CKMBINDEX, TROPONINI in the last 168 hours.  Lipid Panel: No results for input(s): CHOL, TRIG, HDL, CHOLHDL, VLDL, LDLCALC in the last 168 hours.  CBG: Recent Labs  Lab 10/05/19 0220  GLUCAP 153*    Microbiology: Results for orders placed or performed during the hospital encounter of 10/04/19  Urine culture     Status: Abnormal   Collection Time: 10/04/19  8:27 AM   Specimen: Urine, Clean Catch  Result Value Ref Range Status   Specimen Description   Final    URINE, CLEAN CATCH Performed at [facility], [address] [place], [place] [address]    Special Requests   Final    Normal Performed at [facility], [address] [place], [place] [address]    Culture 80,000 COLONIES/mL ESCHERICHIA COLI (A)  Final   Report Status 10/06/2019 FINAL  Final   Organism ID, Bacteria ESCHERICHIA COLI (A)  Final      Susceptibility   Escherichia coli - MIC*    AMPICILLIN <=2 SENSITIVE Sensitive     CEFAZOLIN <=4 SENSITIVE Sensitive     CEFTRIAXONE <=0.25 SENSITIVE Sensitive     CIPROFLOXACIN <=0.25 SENSITIVE Sensitive     GENTAMICIN <=1 SENSITIVE Sensitive     IMIPENEM <=0.25 SENSITIVE Sensitive     NITROFURANTOIN <=16 SENSITIVE Sensitive     TRIMETH/SULFA <=20 SENSITIVE Sensitive     AMPICILLIN/SULBACTAM <=2 SENSITIVE Sensitive     PIP/TAZO <=4 SENSITIVE Sensitive     * 80,000  COLONIES/mL ESCHERICHIA COLI  Blood culture (routine x 2)     Status: None (Preliminary result)   Collection Time: 10/04/19  9:19 AM   Specimen: BLOOD LEFT ARM  Result Value Ref Range Status   Specimen Description BLOOD LEFT ARM  Final   Special Requests   Final    BOTTLES DRAWN AEROBIC AND ANAEROBIC Blood Culture adequate volume   Culture   Final    NO GROWTH 3 DAYS Performed at [facility], [address], [place], [place] [address]    Report Status PENDING  Incomplete  Blood culture (routine x 2)     Status: None (Preliminary result)   Collection Time: 10/04/19  9:19 AM   Specimen: BLOOD RIGHT ARM  Result Value Ref Range Status   Specimen Description BLOOD RIGHT ARM  Final   Special Requests   Final    BOTTLES DRAWN AEROBIC AND ANAEROBIC Blood Culture adequate volume   Culture   Final    NO GROWTH 3 DAYS Performed at [facility], [address], [place], [place] [address]    Report Status PENDING  Incomplete  Respiratory Panel by RT PCR (Flu A&B, Covid) - Nasopharyngeal Swab     Status: None   Collection Time: 10/04/19 11:53 AM   Specimen: Nasopharyngeal Swab  Result Value Ref Range Status   SARS Coronavirus 2 by RT PCR NEGATIVE NEGATIVE Final    Comment: (NOTE) SARS-CoV-2 target nucleic acids are NOT DETECTED. The SARS-CoV-2 RNA is generally detectable in upper respiratoy specimens during the acute phase of infection. The lowest concentration of SARS-CoV-2 viral copies this assay can detect is 131 copies/mL. A negative result does not preclude SARS-Cov-2 infection and should not be used as the sole basis for treatment or other patient management decisions. A negative result may occur with  improper specimen collection/handling, submission of specimen other than nasopharyngeal swab, presence of viral mutation(s) within the areas targeted by this assay, and inadequate number of viral copies (<131 copies/mL). A negative result must be combined with  clinical observations, patient history, and epidemiological information. The expected result is Negative. Fact Sheet for Patients:  PinkCheek.be Fact Sheet for Healthcare Providers:  GravelBags.it This test is not yet ap proved or cleared by the [place] FDA and  has been authorized for detection and/or diagnosis of SARS-CoV-2 by FDA under an Emergency Use Authorization (EUA). This EUA  will remain  in effect (meaning this test can be used) for the duration of the COVID-19 declaration under Section 564(b)(1) of the Act, 21 U.S.C. section 360bbb-3(b)(1), unless the authorization is terminated or revoked sooner.    Influenza A by PCR NEGATIVE NEGATIVE Final   Influenza B by PCR NEGATIVE NEGATIVE Final    Comment: (NOTE) The Xpert Xpress SARS-CoV-2/FLU/RSV assay is intended as an aid in  the diagnosis of influenza from Nasopharyngeal swab specimens and  should not be used as a sole basis for treatment. Nasal washings and  aspirates are unacceptable for Xpert Xpress SARS-CoV-2/FLU/RSV  testing. Fact Sheet for Patients: https://www.moore.com/ Fact Sheet for Healthcare Providers: https://www.young.biz/ This test is not yet approved or cleared by the [place] FDA and  has been authorized for detection and/or diagnosis of SARS-CoV-2 by  FDA under an Emergency Use Authorization (EUA). This EUA will remain  in effect (meaning this test can be used) for the duration of the  Covid-19 declaration under Section 564(b)(1) of the Act, 21  U.S.C. section 360bbb-3(b)(1), unless the authorization is  terminated or revoked. Performed at [facility], [address] [place], [place] [address]   MRSA PCR Screening     Status: None   Collection Time: 10/05/19  3:02 AM   Specimen: Nasopharyngeal  Result Value Ref Range Status   MRSA by PCR NEGATIVE NEGATIVE Final    Comment:         The GeneXpert MRSA Assay (FDA approved for NASAL specimens only), is one component of a comprehensive MRSA colonization surveillance program. It is not intended to diagnose MRSA infection nor to guide or monitor treatment for MRSA infections. Performed at [facility], [address] [place], [place] [address]     Coagulation Studies: Recent Labs    10/04/19 1153  LABPROT 12.6  INR 1.0    Imaging: EEG  Result Date: 10/05/2019 Charlsie Quest, MD     10/05/2019  6:06 PM Patient Name: Carla Dickson MRN: [id number] Epilepsy Attending: Charlsie Quest Referring Physician/Provider: Dr Erin Fulling Date: 10/05/2019 Duration: 25.25 mins Patient history: 49yo F with ams. EEG to evaluate for seizure. Level of alertness: asleep/sedated AEDs during EEG study: Lorazepam Technical aspects: This EEG study was done with scalp electrodes positioned according to the 10-20 International system of electrode placement. Electrical activity was acquired at a sampling rate of 500Hz  and reviewed with a high frequency filter of 70Hz  and a low frequency filter of 1Hz . EEG data were recorded continuously and digitally stored. DESCRIPTION: EEG showed continuous generalized low amplitude 2-3hz  delta slowing as well as sleep spindles (12-14hz ) maximal frontocentral. Hyperventilation and photic stimulation were not performed. ABNORMALITY - Continuous slow, generalized IMPRESSION: This study showed evidence of severe diffuse encephalopathy, non specific to etiology but could be secondary to sedation. No seizures or epileptiform discharges were seen throughout the recording. Priyanka    Medications:  I have reviewed the patient's current medications. Scheduled: . Chlorhexidine Gluconate Cloth  6 each Topical Daily  . enoxaparin (LOVENOX) injection  30 mg Subcutaneous Q24H  . vancomycin variable dose per unstable renal function (pharmacist dosing)   Does not apply See admin instructions     Assessment/Plan: 49 year old female with a history of stroke, chronic alcoholism, cirrhosis, HTN, GERD, presenting with seizure activity. Per EMS report, they were called to scene 2/2 witnessed seizure like activity. On their arrival, pt was found confused, tachycardic, disoriented. Unclear how long she had seized. On  presentation remained confused.  Noted to have fever of 101 and elevated wbc count.  Due to agitation LP unable to be performed.  Started on decadron, acyclovir and antibiotics on 1/7.  No further fevers.  No seizure activity noted.   Seizure activity may very well have been related to ETOH withdrawal but with fever and elevated wbc count (which also can b e related to seizure activity) can not rule out infection/meningits.  Patient has now been on antibiotics for 48 hours making LP less helpful in ruling out meningitis.  Initial head CT reviewed and shows no acute changes.  EEG only significant for slowing.  MRI of the brain pending.    Recommendations: 1. MRI of the brain pending 2. If MRI unremarkable, EEG unremarkable as well, would consider discontinuing Acyclovir.  IF no worsening noted would continue off. 3. For now would continue use of Rocephin and Vancomycin for CNS coverage.   4. Agree with continued CIWA protocol.   5. Seizure likely provoked.  Anticonvulsant therapy not indicated at this time.  6. Seizure precautions 7. Patient unable to drive, operate heavy machinery, perform activities at heights and participate in water activities until release by outpatient physician.    LOS: 3 days   Thana Farr, MD Neurology [phone] 10/07/2019  9:18 AM

## 2019-10-07 NOTE — Consult Note (Signed)
PHARMACY CONSULT NOTE  Pharmacy Consult for Electrolyte Monitoring and Replacement   Recent Labs: Potassium (mmol/L)  Date Value  10/07/2019 3.0 (L)  10/22/2014 3.3 (L)   Magnesium (mg/dL)  Date Value  08/56/9437 1.9   Calcium (mg/dL)  Date Value  00/52/5910 9.3   Calcium, Total (mg/dL)  Date Value  28/90/2284 8.6   Albumin (g/dL)  Date Value  06/98/6148 3.3 (L)  10/22/2014 4.0   Phosphorus (mg/dL)  Date Value  30/73/5430 4.5   Sodium (mmol/L)  Date Value  10/07/2019 144  10/22/2014 139    Assessment: 49 year old African-American female with severe encephalopathy likely related to alcohol withdrawal with febrile episodes with concern for underlying encephalitis meningitis  Goal of Therapy (given critical condition):  Potassium 4.0 - 5.1 mmol/L Magnesium 2.0 - 2.4 mg/dL All other electrolytes WNL  Plan:  Despite worsening renal function and replacement, potassium continues to decrease. Patient continues on LR w/ K. Will replace slightly more aggressively today and give potassium 10 mEq IV x 5 runs. Will also give mag 1 g IV x 1. Will f/u all electrolytes with am labs.  Pricilla Riffle ,PharmD Clinical Pharmacist 10/07/2019 9:10 AM

## 2019-10-08 ENCOUNTER — Inpatient Hospital Stay: Payer: Self-pay

## 2019-10-08 LAB — PHOSPHORUS
Phosphorus: 4.4 mg/dL (ref 2.5–4.6)
Phosphorus: 3.7 mg/dL (ref 2.5–4.6)

## 2019-10-08 LAB — AMMONIA
Ammonia: 36 umol/L — ABNORMAL HIGH (ref 9–35)
Ammonia: 30 umol/L (ref 9–35)

## 2019-10-08 LAB — CBC WITH DIFFERENTIAL/PLATELET
Abs Immature Granulocytes: 0.03 10*3/uL (ref 0.00–0.07)
Basophils Absolute: 0 10*3/uL (ref 0.0–0.1)
Basophils Relative: 0 %
Eosinophils Absolute: 0.1 10*3/uL (ref 0.0–0.5)
Eosinophils Relative: 1 %
HCT: 35.4 % — ABNORMAL LOW (ref 36.0–46.0)
Hemoglobin: 11.8 g/dL — ABNORMAL LOW (ref 12.0–15.0)
Immature Granulocytes: 1 %
Lymphocytes Relative: 28 %
Lymphs Abs: 1.7 10*3/uL (ref 0.7–4.0)
MCH: 31.6 pg (ref 26.0–34.0)
MCHC: 33.3 g/dL (ref 30.0–36.0)
MCV: 94.7 fL (ref 80.0–100.0)
Monocytes Absolute: 0.6 10*3/uL (ref 0.1–1.0)
Monocytes Relative: 9 %
Neutro Abs: 3.7 10*3/uL (ref 1.7–7.7)
Neutrophils Relative %: 61 %
Platelets: 93 10*3/uL — ABNORMAL LOW (ref 150–400)
RBC: 3.74 MIL/uL — ABNORMAL LOW (ref 3.87–5.11)
RDW: 15.5 % (ref 11.5–15.5)
WBC: 6.1 10*3/uL (ref 4.0–10.5)
nRBC: 0 % (ref 0.0–0.2)

## 2019-10-08 LAB — BASIC METABOLIC PANEL
Anion gap: 13 (ref 5–15)
BUN: 37 mg/dL — ABNORMAL HIGH (ref 6–20)
CO2: 19 mmol/L — ABNORMAL LOW (ref 22–32)
Calcium: 9.1 mg/dL (ref 8.9–10.3)
Chloride: 104 mmol/L (ref 98–111)
Creatinine, Ser: 2.67 mg/dL — ABNORMAL HIGH (ref 0.44–1.00)
GFR calc Af Amer: 24 mL/min — ABNORMAL LOW (ref >60)
GFR calc non Af Amer: 20 mL/min — ABNORMAL LOW (ref >60)
Glucose, Bld: 127 mg/dL — ABNORMAL HIGH (ref 70–99)
Potassium: 3.5 mmol/L (ref 3.5–5.1)
Sodium: 136 mmol/L (ref 135–145)

## 2019-10-08 LAB — URINE DRUG SCREEN, QUALITATIVE (ARMC ONLY)
Amphetamines, Ur Screen: NOT DETECTED
Barbiturates, Ur Screen: NOT DETECTED
Benzodiazepine, Ur Scrn: POSITIVE — AB
Cannabinoid 50 Ng, Ur ~~LOC~~: NOT DETECTED
Cocaine Metabolite,Ur ~~LOC~~: NOT DETECTED
MDMA (Ecstasy)Ur Screen: NOT DETECTED
Methadone Scn, Ur: NOT DETECTED
Opiate, Ur Screen: NOT DETECTED
Phencyclidine (PCP) Ur S: NOT DETECTED
Tricyclic, Ur Screen: NOT DETECTED

## 2019-10-08 LAB — TROPONIN I (HIGH SENSITIVITY)
Troponin I (High Sensitivity): 7 ng/L (ref <18)
Troponin I (High Sensitivity): 15 ng/L (ref <18)

## 2019-10-08 LAB — POTASSIUM: Potassium: 3.2 mmol/L — ABNORMAL LOW (ref 3.5–5.1)

## 2019-10-08 LAB — MAGNESIUM
Magnesium: 1.8 mg/dL (ref 1.7–2.4)
Magnesium: 1.5 mg/dL — ABNORMAL LOW (ref 1.7–2.4)

## 2019-10-08 LAB — VANCOMYCIN, RANDOM: Vancomycin Rm: 17

## 2019-10-08 MED ORDER — POLYETHYLENE GLYCOL 3350 17 G PO PACK
17.0000 g | PACK | Freq: Every day | ORAL | Status: DC
  Administered 2019-10-09: 10:00:00 17 g via ORAL
  Filled 2019-10-08: qty 1

## 2019-10-08 MED ORDER — POTASSIUM CHLORIDE CRYS ER 20 MEQ PO TBCR
30.0000 meq | EXTENDED_RELEASE_TABLET | ORAL | Status: AC
  Administered 2019-10-08: 30 meq via ORAL
  Filled 2019-10-08: qty 2

## 2019-10-08 MED ORDER — ADULT MULTIVITAMIN W/MINERALS CH
1.0000 | ORAL_TABLET | Freq: Every day | ORAL | Status: DC
  Administered 2019-10-08 – 2019-10-09 (×2): 1 via ORAL
  Filled 2019-10-08 (×2): qty 1

## 2019-10-08 MED ORDER — SODIUM CHLORIDE 0.9 % IV SOLN
4.0000 g | Freq: Once | INTRAVENOUS | Status: AC
  Administered 2019-10-08: 22:00:00 4 g via INTRAVENOUS
  Filled 2019-10-08: qty 8

## 2019-10-08 MED ORDER — CALCIUM CARBONATE ANTACID 500 MG PO CHEW
1.0000 | CHEWABLE_TABLET | Freq: Three times a day (TID) | ORAL | Status: DC | PRN
  Administered 2019-10-08 (×2): 200 mg via ORAL
  Filled 2019-10-08 (×2): qty 1

## 2019-10-08 MED ORDER — DEXMEDETOMIDINE HCL IN NACL 400 MCG/100ML IV SOLN
0.4000 ug/kg/h | INTRAVENOUS | Status: DC
  Administered 2019-10-08: 0.5 ug/kg/h via INTRAVENOUS

## 2019-10-08 MED ORDER — FOLIC ACID 1 MG PO TABS
1.0000 mg | ORAL_TABLET | Freq: Every day | ORAL | Status: DC
  Administered 2019-10-08 – 2019-10-09 (×2): 1 mg via ORAL
  Filled 2019-10-08 (×2): qty 1

## 2019-10-08 MED ORDER — MORPHINE SULFATE (PF) 2 MG/ML IV SOLN
1.0000 mg | INTRAVENOUS | Status: DC | PRN
  Administered 2019-10-09: 1 mg via INTRAVENOUS
  Filled 2019-10-08: qty 1

## 2019-10-08 MED ORDER — POTASSIUM CHLORIDE CRYS ER 20 MEQ PO TBCR: 20.0000 meq | EXTENDED_RELEASE_TABLET | Freq: Once | ORAL | Status: DC

## 2019-10-08 MED ORDER — FAMOTIDINE 20 MG PO TABS
20.0000 mg | ORAL_TABLET | Freq: Every day | ORAL | Status: DC
  Administered 2019-10-08 – 2019-10-09 (×2): 20 mg via ORAL
  Filled 2019-10-08 (×2): qty 1

## 2019-10-08 MED ORDER — THIAMINE HCL 100 MG PO TABS
100.0000 mg | ORAL_TABLET | Freq: Every day | ORAL | Status: DC
  Administered 2019-10-08 – 2019-10-09 (×2): 100 mg via ORAL
  Filled 2019-10-08 (×2): qty 1

## 2019-10-08 NOTE — Progress Notes (Addendum)
CRITICAL CARE PROGRESS NOTE    Name: Carla Dickson MRN: [id number] DOB: [birth year]     LOS: 4   SUBJECTIVE FINDINGS & SIGNIFICANT EVENTS   Patient description: Carla Dickson is a 49 yo female with a history of alcohol abuse, cirrhosis, and hypertension who presented to the ED on 10/03/2018 for seizure activity.  She was admitted to the ICU with acute encephalopathy due to alcohol withdrawal and suspected meningitis.  She was started on CIWA protocol, and acyclovir, rocephin and vancomycin.     Lines / Drains: PIV Right hand  Cultures / Sepsis markers: Unable to obtain LP due to patient agitation.  Blood culture 1/7 showed no growth in 5 days.  Urine culture positive for E coli.   Nasal MRSA negative.  Patient remains afebrile at 97.36F today.  Tachycardia yesterday afternoon up to 113 bpm, 73 bpm today.  Hypertensive yest afternoon up to 161/84 mmHg.  108/54 mmHg now.  WBC back in normal range at 6.1.    Anti-infectives: Vancomycin Acyclovir Ceftriaxone   Protocols / Consultants: CIWA protocol Neurology consulting  Tests / Events: 1/7: patient presented to ED following seizure-like activity.  Admitted to ICU for severe encephalopathy associated with ETOH withdrawal and possible meningitis/encephalitis. 1/8 remains in DT's, requiring 1:1 sitter, EEG significant with sever diffuse encephalopathy, no seizures. 1/9 remains on precedex 1/10: patient non-compliant with MRI.  Decreased precedex dose from 1.2 to 0.9  Overnight: Patient agitation improving.  Patient becoming more alert and oriented.   CC Acute Encephalopathy  HPI Agitation improving, patient increasingly alert and oriented Still on precedex, but decreased dose from 1.2 to 0.9. CIWA protocol, also on thiamine MRI today  PAST MEDICAL  HISTORY   Past Medical History:  Diagnosis Date  . Alcohol use   . Fracture of fifth metacarpal bone of left hand with routine healing   . Hypercholesteremia   . Hypertension   . Reflux      SURGICAL HISTORY   Past Surgical History:  Procedure Laterality Date  . ABDOMINAL HYSTERECTOMY       FAMILY HISTORY   Family History  Problem Relation Age of Onset  . Breast cancer Maternal Grandmother      SOCIAL HISTORY   Social History   Tobacco Use  . Smoking status: Current Every Day Smoker    Packs/day: 0.50    Types: Cigarettes  . Smokeless tobacco: Never Used  Substance Use Topics  . Alcohol use: Yes    Alcohol/week: 26.0 standard drinks    Types: 24 Shots of liquor, 2 Cans of beer per week    Comment: 2-3 drinks liquor per day  . Drug use: No     MEDICATIONS   Current Medication:  Current Facility-Administered Medications:  .  acetaminophen (TYLENOL) tablet 650 mg, 650 mg, Oral, Q4H PRN, Mortimer Fries, Tesean Stump, MD, 650 mg at 10/08/19 0111 .  acyclovir (ZOVIRAX) 600 mg in dextrose 5 % 100 mL IVPB, 10 mg/kg, Intravenous, Q24H, Flora Lipps, MD, Stopped at 10/07/19 0902 .  calcium carbonate (TUMS - dosed in mg elemental calcium) chewable tablet 200 mg of elemental calcium, 1 tablet, Oral, TID PRN, Awilda Bill, NP, 200 mg of elemental calcium at 10/08/19 0653 .  cefTRIAXone (ROCEPHIN) 2 g in sodium chloride 0.9 % 100 mL IVPB, 2 g, Intravenous, Q12H, Bradly Bienenstock, NP, Stopped at 10/07/19 2206 .  Chlorhexidine Gluconate Cloth 2 % PADS 6 each, 6 each, Topical, Daily, Bradly Bienenstock, NP, 6 each at 10/07/19 1312 .  dexmedetomidine (PRECEDEX) 400 MCG/100ML (4 mcg/mL) infusion, 0.4-1.2 mcg/kg/hr, Intravenous, Titrated, Shaune Pollack, MD, Last Rate: 13.5 mL/hr at 10/08/19 0622, 0.9 mcg/kg/hr at 10/08/19 0622 .  enoxaparin (LOVENOX) injection 30 mg, 30 mg, Subcutaneous, Q24H, Afshin Chrystal, MD, 30 mg at 10/07/19 0806 .  famotidine (PEPCID) IVPB 20 mg premix, 20 mg,  Intravenous, Q24H, Amarria Andreasen, MD, Stopped at 10/07/19 1012 .  hydrALAZINE (APRESOLINE) injection 10 mg, 10 mg, Intravenous, Q6H PRN, Harlon Ditty D, NP, 10 mg at 10/06/19 2004 .  LORazepam (ATIVAN) injection 2 mg, 2 mg, Intravenous, Q4H PRN, Harlon Ditty D, NP, 2 mg at 10/07/19 7673 .  ondansetron (ZOFRAN) injection 4 mg, 4 mg, Intravenous, Q6H PRN, Erin Fulling, MD .  thiamine (B-1) 100 mg, folic acid 1 mg in sodium chloride 0.9 % 50 mL injection, , Intravenous, Q24H, Vaniyah Lansky, MD, Last Rate: 50 mL/hr at 10/07/19 2216, New Bag at 10/07/19 2216 .  vancomycin variable dose per unstable renal function (pharmacist dosing), , Does not apply, See admin instructions, Erin Fulling, MD    ALLERGIES   Patient has no known allergies.    REVIEW OF SYSTEMS   Review of Systems  Constitutional: Negative for chills and fever.  Respiratory: Negative for shortness of breath.   Cardiovascular: Negative for chest pain and palpitations.  Gastrointestinal: Negative for abdominal pain, nausea and vomiting.  Genitourinary: Negative for dysuria and hematuria.  Neurological: Negative for headaches.     PHYSICAL EXAMINATION   Vital Signs: Temp:  [97.4 F (36.3 C)-98 F (36.7 C)] 97.7 F (36.5 C) (01/11 0800) Pulse Rate:  [73-113] 73 (01/11 0200) Resp:  [12-25] 14 (01/11 0600) BP: (108-161)/(54-125) 108/54 (01/11 0500) SpO2:  [95 %-100 %] 100 % (01/10 2200) Weight:  [62.4 kg] 62.4 kg (01/11 0402)  GENERAL: Patient is alert and oriented to place and person.  She is significantly less agitated than prior days.  Resting comfortably in chair.  No acute distress. HEAD: Normocephalic, atraumatic.  EYES: Pupils equal, round, reactive to light.  No scleral icterus.  MOUTH: Moist mucosal membrane. NECK: Supple. No thyromegaly. No nodules. No JVD.  PULMONARY: Lungs clear to auscultation bilaterally.   CARDIOVASCULAR: S1 and S2. Regular rate and rhythm. No murmurs, rubs, or gallops.   GASTROINTESTINAL: Soft, nontender, non-distended. No masses. Positive bowel sounds. No hepatosplenomegaly.  MUSCULOSKELETAL: No swelling, clubbing, or edema.  NEUROLOGIC: Minimally agitated.  Alert and oriented to place and person.  CN 2-12 intact.  Follows commands.  Slowly ambulating from chair to bedside commode with assistance.   SKIN:intact,warm,dry   PERTINENT DATA     Infusions: . acyclovir Stopped (10/07/19 0902)  . cefTRIAXone (ROCEPHIN)  IV Stopped (10/07/19 2206)  . dexmedetomidine (PRECEDEX) IV infusion 0.9 mcg/kg/hr (10/08/19 0622)  . famotidine (PEPCID) IV Stopped (10/07/19 1012)  . small volume/piggyback builder 50 mL/hr at 10/07/19 2216   Scheduled Medications: . Chlorhexidine Gluconate Cloth  6 each Topical Daily  . enoxaparin (LOVENOX) injection  30 mg Subcutaneous Q24H  . vancomycin variable dose per unstable renal function (pharmacist dosing)   Does not apply See admin instructions   PRN Medications: acetaminophen, calcium carbonate, hydrALAZINE, LORazepam, ondansetron (ZOFRAN) IV Hemodynamic parameters:   Intake/Output: 01/10 0701 - 01/11 0700 In: 2327.7 [P.O.:1200; I.V.:315.7; IV Piggyback:812] Out: 75 [Urine:75]  Ventilator  Settings:     Other Labs:  LAB RESULTS:  Basic Metabolic Panel: Recent Labs  Lab 10/04/19 0827 10/05/19 0428 10/05/19 1240 10/06/19 0533 10/07/19 0727  NA 136 142  --  145 144  K 4.1 2.8* 3.5 3.2* 3.0*  CL 94* 102  --  106 108  CO2 23 24  --  17* 20*  GLUCOSE 170* 178*  --  154* 105*  BUN 9 9  --  25* 41*  CREATININE 0.86 0.73  --  2.80* 3.16*  CALCIUM 9.5 9.7  --  9.6 9.3  MG 1.4*  --  1.8 2.1 1.9  PHOS  --   --  2.8 3.8 4.5   Liver Function Tests: Recent Labs  Lab 10/04/19 0827 10/05/19 0428 10/06/19 0533  AST 159* 85*  --   ALT 67* 55*  --   ALKPHOS 133* 115  --   BILITOT 2.0* 1.6*  --   PROT 9.4* 9.1*  --   ALBUMIN 4.3 3.9 3.3*   No results for input(s): LIPASE, AMYLASE in the last 168  hours. Recent Labs  Lab 10/04/19 0827  AMMONIA 49*   CBC: Recent Labs  Lab 10/04/19 0827 10/05/19 0428 10/06/19 0533 10/07/19 0727 10/08/19 0708  WBC 13.5* 10.9* 16.4* 13.4* 6.1  NEUTROABS 11.8*  --   --   --  3.7  HGB 13.0 13.4 13.5 11.6* 11.8*  HCT 38.6 39.9 40.2 34.5* 35.4*  MCV 93.7 93.4 93.5 94.0 94.7  PLT 135* 108* 97* 100* 93*   Cardiac Enzymes: No results for input(s): CKTOTAL, CKMB, CKMBINDEX, TROPONINI in the last 168 hours. BNP: Invalid input(s): POCBNP CBG: Recent Labs  Lab 10/05/19 0220  GLUCAP 153*     IMAGING RESULTS:  Imaging: No results found. @PROBHOSP @   ASSESSMENT AND PLAN    -Multidisciplinary rounds held today  Severe acute encephalopathy in setting of seizure, ETOH withdrawal, possible meningitis - Improving.  Patient becoming increasingly alert and oriented.  Agitation improving.  Patient denies headache.   - Previously unable to obtain LP due to patient agitation.  Due to duration of abx therapy, likely would not be helpful at this point.  Blood cultures negative over 5 days.  - Neurology following- appreciate their input.  Pt will undergo MRI today, and medication regimen will be evaluated based on results. - Continue acyclovir, ceftriaxone and vancomycin at this time.  Will consider d/c following MRI.   - Continue to wean precedex as tolerated.  Delirium Tremens - On CIWA protocol - Patient denies nausea, vomiting, sweats, headache, tremor. Agitation improving, and patient becoming increasingly oriented.   - Continue ativan and thiamine.  Change thiamine to PO. - Ammonia 49 on 1/7.  Repeat today.    Hypertension - BP peaked to 161/84 mmHg yesterday, now well controlled at 108/54 mmHg.   - Chronic problem. - Continue hydralazine.    GI/Nutrition GI PROPHYLAXIS as indicated DIET-->Continue cardiac diet as tolerated No BM recorded since 10/03/2018- start Miralax  Cirrhosis - Chronic issue. AST 85, ALT 55 total Bili 1.6 on  10/04/2018 - No evidence of scleral ictaris or jaundice at this time. - Avoid hepatotoxic medications.   GERD - continue famotidine   ELECTROLYTES -follow labs as needed -replace as needed -pharmacy consultation   DVT/GI PRX ordered - On Lovenox 30 mg MONITOR FSBS ASSESS the need for LABS as needed      12/03/2018, M.D.  [facility]  Medical Director [facility] Medical Director [facility] Cardio-Pulmonary Department

## 2019-10-08 NOTE — Progress Notes (Addendum)
Reason for consult: Seizure  Subjective: Patient has had no further seizures while in the hospital.  She is more calm today, alert to person and place.  She is still confused,  has remained afebrile.   ROS: negative except above Examination  Vital signs in last 24 hours: Temp:  [97.4 F (36.3 C)-98 F (36.7 C)] 97.7 F (36.5 C) (01/11 0800) Pulse Rate:  [73-113] 73 (01/11 0200) Resp:  [12-25] 16 (01/11 0900) BP: (108-161)/(54-125) 136/95 (01/11 0900) SpO2:  [95 %-100 %] 100 % (01/10 2200) Weight:  [62.4 kg] 62.4 kg (01/11 0402)  General: Sitting in chair CVS: pulse-normal rate and rhythm RS: breathing comfortably Extremities: normal   Neuro: MS: Alert, oriented to place and person, not oriented to age or month.  No obvious language deficits with intact reading, naming objects and repetition. CN: pupils equal and reactive,  EOMI, face symmetric, tongue midline, normal sensation over face, Motor: 5/5 strength in all 4 extremities Reflexes:plantars: flexor Coordination: normal Gait: not tested  Basic Metabolic Panel: Recent Labs  Lab 10/04/19 0827 10/05/19 0428 10/05/19 1240 10/06/19 0533 10/07/19 0727  NA 136 142  --  145 144  K 4.1 2.8* 3.5 3.2* 3.0*  CL 94* 102  --  106 108  CO2 23 24  --  17* 20*  GLUCOSE 170* 178*  --  154* 105*  BUN 9 9  --  25* 41*  CREATININE 0.86 0.73  --  2.80* 3.16*  CALCIUM 9.5 9.7  --  9.6 9.3  MG 1.4*  --  1.8 2.1 1.9  PHOS  --   --  2.8 3.8 4.5    CBC: Recent Labs  Lab 10/04/19 0827 10/05/19 0428 10/06/19 0533 10/07/19 0727 10/08/19 0708  WBC 13.5* 10.9* 16.4* 13.4* 6.1  NEUTROABS 11.8*  --   --   --  3.7  HGB 13.0 13.4 13.5 11.6* 11.8*  HCT 38.6 39.9 40.2 34.5* 35.4*  MCV 93.7 93.4 93.5 94.0 94.7  PLT 135* 108* 97* 100* 93*     Coagulation Studies: No results for input(s): LABPROT, INR in the last 72 hours.  Imaging Reviewed: CT head unremarkable  MRI brain still pending   ASSESSMENT AND PLAN  49 year old  female with history of stroke, chronic alcoholism with cirrhosis, hypertension presented to the ED with seizure activity.  Also noted to have a fever of 101 and  leukocytosis.  LP attempted but was unable to be performed. Not reattempted as it was felt to be low yield as patient now >48hrs with antibiotics.   Patient started on Decadron, acyclovir and antibiotics on 1/7.  EEG showed no epileptiform discharges.  MRI of the brain still pending. SARS-CoV-2 negative,   Acute Encephalopathy Seizure History of alcohol abuse with possible withdrawal Acute Kidney injury  Urinary tract infection with E.coli  Recommendations MRI brain still pending ( ideally would prefer with contrast, however due to GFR <30 will obtain w/o contrast No longer febrile, if MRI brain is unremarkable consider D./C meningitic coverage is as well as acyclovir. Will defer to ICU for treatment of UTI.  Continue management of AKI per CCM Patient has not been started on antiepileptic drugs as seizure thought to be provoked Continue seizure precautions CIWA protocol for alcohol withdrawal  Agree with empiric thiamine, started today    Addendum Reviewed MRI brain, shows chronic white matter disease, atrophy and chronic infarcts, no acute stroke/ no signal changes to suggest encephalitis.   Georgiana Spinner Reggie Welge [facility] Pager Number [phone] For  questions after 7pm please refer to AMION to reach the Neurologist on call

## 2019-10-08 NOTE — Consult Note (Signed)
PHARMACY CONSULT NOTE  Pharmacy Consult for Electrolyte Monitoring and Replacement   Recent Labs: Potassium (mmol/L)  Date Value  10/08/2019 3.5  10/22/2014 3.3 (L)   Magnesium (mg/dL)  Date Value  50/05/3817 1.8   Calcium (mg/dL)  Date Value  29/93/7169 9.1   Calcium, Total (mg/dL)  Date Value  67/89/3810 8.6   Albumin (g/dL)  Date Value  17/51/0258 3.3 (L)  10/22/2014 4.0   Phosphorus (mg/dL)  Date Value  52/77/8242 4.4   Sodium (mmol/L)  Date Value  10/08/2019 136  10/22/2014 139    Assessment: 49 year old African-American female with severe encephalopathy likely related to alcohol withdrawal with febrile episodes with concern for underlying encephalitis meningitis  Goal of Therapy (given critical condition):  Potassium 4.0 - 5.1 mmol/L Magnesium 2.0 - 2.4 mg/dL All other electrolytes WNL  Plan:  Potassium PO x 1.   Labs in am.   Pharmacy will continue to monitor and adjust per consult.   Carla Dickson L,  10/08/2019 5:22 PM

## 2019-10-09 ENCOUNTER — Inpatient Hospital Stay (HOSPITAL_COMMUNITY)
Admit: 2019-10-09 | Discharge: 2019-10-09 | Disposition: A | Discharge status: Home / Self Care | Payer: Self-pay | Attending: Critical Care Medicine | Admitting: Critical Care Medicine

## 2019-10-09 DIAGNOSIS — I340 Nonrheumatic mitral (valve) insufficiency: Secondary | ICD-10-CM

## 2019-10-09 DIAGNOSIS — I351 Nonrheumatic aortic (valve) insufficiency: Secondary | ICD-10-CM

## 2019-10-09 LAB — CBC WITH DIFFERENTIAL/PLATELET
Abs Immature Granulocytes: 0.03 10*3/uL (ref 0.00–0.07)
Basophils Absolute: 0 10*3/uL (ref 0.0–0.1)
Basophils Relative: 0 %
Eosinophils Absolute: 0.1 10*3/uL (ref 0.0–0.5)
Eosinophils Relative: 2 %
HCT: 32.6 % — ABNORMAL LOW (ref 36.0–46.0)
Hemoglobin: 11 g/dL — ABNORMAL LOW (ref 12.0–15.0)
Immature Granulocytes: 1 %
Lymphocytes Relative: 30 %
Lymphs Abs: 1.6 10*3/uL (ref 0.7–4.0)
MCH: 31.6 pg (ref 26.0–34.0)
MCHC: 33.7 g/dL (ref 30.0–36.0)
MCV: 93.7 fL (ref 80.0–100.0)
Monocytes Absolute: 0.7 10*3/uL (ref 0.1–1.0)
Monocytes Relative: 13 %
Neutro Abs: 2.8 10*3/uL (ref 1.7–7.7)
Neutrophils Relative %: 54 %
Platelets: 96 10*3/uL — ABNORMAL LOW (ref 150–400)
RBC: 3.48 MIL/uL — ABNORMAL LOW (ref 3.87–5.11)
RDW: 15.5 % (ref 11.5–15.5)
WBC: 5.2 10*3/uL (ref 4.0–10.5)
nRBC: 0 % (ref 0.0–0.2)

## 2019-10-09 LAB — CULTURE, BLOOD (ROUTINE X 2)
Culture: NO GROWTH
Culture: NO GROWTH
Special Requests: ADEQUATE
Special Requests: ADEQUATE

## 2019-10-09 LAB — BASIC METABOLIC PANEL
Anion gap: 10 (ref 5–15)
BUN: 36 mg/dL — ABNORMAL HIGH (ref 6–20)
CO2: 22 mmol/L (ref 22–32)
Calcium: 8.9 mg/dL (ref 8.9–10.3)
Chloride: 104 mmol/L (ref 98–111)
Creatinine, Ser: 2.13 mg/dL — ABNORMAL HIGH (ref 0.44–1.00)
GFR calc Af Amer: 31 mL/min — ABNORMAL LOW (ref >60)
GFR calc non Af Amer: 27 mL/min — ABNORMAL LOW (ref >60)
Glucose, Bld: 117 mg/dL — ABNORMAL HIGH (ref 70–99)
Potassium: 3.5 mmol/L (ref 3.5–5.1)
Sodium: 136 mmol/L (ref 135–145)

## 2019-10-09 LAB — PHOSPHORUS: Phosphorus: 5.3 mg/dL — ABNORMAL HIGH (ref 2.5–4.6)

## 2019-10-09 LAB — ECHOCARDIOGRAM COMPLETE
Height: 65 in
Weight: 2200 oz

## 2019-10-09 LAB — MAGNESIUM: Magnesium: 3.4 mg/dL — ABNORMAL HIGH (ref 1.7–2.4)

## 2019-10-09 LAB — TSH: TSH: 0.936 u[IU]/mL (ref 0.350–4.500)

## 2019-10-09 NOTE — Discharge Summary (Signed)
Physician Discharge Summary  Patient ID: Carla Dickson MRN: [id number] DOB/AGE: 1971/04/07 49 y.o.  Admit date: 10/04/2019 Discharge date: 10/09/2019  Admission Diagnoses:ETOH withdrawal, SEIZURES, ?MENINGITIS  Discharge Diagnoses: ETOH withdrawal, SEIZURES, ?MENINGITIS Active Problems:   Encephalopathy   Alcohol withdrawal syndrome, with delirium [facility])   Discharged Condition: PATIENT LEAVING AMA Patient description: Carla Dickson is a 49 yo female with a history of alcohol abuse, cirrhosis, and hypertension who presented to the ED on 10/03/2018 for seizure activity.  She was admitted to the ICU with acute encephalopathy due to alcohol withdrawal and suspected meningitis.  She was started on CIWA protocol, and acyclovir, rocephin and vancomycin.     Lines / Drains: PIV Right hand  Cultures / Sepsis markers: Unable to obtain LP due to patient agitation.  Blood culture 1/7 showed no growth in 5 days.  Urine culture positive for E coli.   Nasal MRSA negative.  Patient remains afebrile at 97.57F today.  Tachycardia yesterday afternoon up to 113 bpm, 73 bpm today.  Hypertensive yest afternoon up to 161/84 mmHg.  108/54 mmHg now.  WBC back in normal range at 6.1.    Anti-infectives: Vancomycin Acyclovir Ceftriaxone   Protocols / Consultants: CIWA protocol Neurology consulting  Tests / Events: 1/7: patient presented to ED following seizure-like activity. Admitted to ICU for severe encephalopathy associated with ETOH withdrawal and possible meningitis/encephalitis. 1/8 remains in DT's, requiring 1:1 sitter, EEG significant with sever diffuse encephalopathy, no seizures. 1/9 remains on precedex 1/10: patient non-compliant with MRI.  Decreased precedex dose from 1.2 to 0.9   Severe acute encephalopathy in setting of seizure, ETOH withdrawal, possible meningitis - Improving.  Patient becoming increasingly alert and oriented.  Agitation improving.  Patient denies headache.   -  Previously unable to obtain LP due to patient agitation.  Due to duration of abx therapy, likely would not be helpful at this point.  Blood cultures negative over 5 days.  - Neurology following- appreciate their input.  Pt will undergo MRI today, and medication regimen will be evaluated based on results. - Continue acyclovir, ceftriaxone and vancomycin at this time.  Will consider d/c following MRI.   - Continue to wean precedex as tolerated.  Delirium Tremens - On CIWA protocol - Patient denies nausea, vomiting, sweats, headache, tremor. Agitation improving, and patient becoming increasingly oriented.   - Continue ativan and thiamine.  Change thiamine to PO. - Ammonia 49 on 1/7.  Repeat today.    Hypertension - BP peaked to 161/84 mmHg yesterday, now well controlled at 108/54 mmHg.   - Chronic problem. - Continue hydralazine.    GI/Nutrition GI PROPHYLAXIS as indicated DIET-->Continue cardiac diet as tolerated No BM recorded since 10/03/2018- start Miralax  Cirrhosis - Chronic issue. AST 85, ALT 55 total Bili 1.6 on 10/04/2018 - No evidence of scleral ictaris or jaundice at this time. - Avoid hepatotoxic medications.   GERD - continue famotidine   ELECTROLYTES -follow labs as needed -replace as needed -pharmacy consultation   DVT/GI PRX ordered - On Lovenox 30 mg MONITOR FSBS ASSESS the need for LABS as needed  Significant Diagnostic Studies: MRI BRAIN    Discharge Exam: Blood pressure 130/85, pulse 87, temperature 98.7 F (37.1 C), temperature source Oral, resp. rate 11, height 5\' 5"  (1.651 m), weight 62.4 kg, SpO2 98 %.  Disposition: PATIENT IS SIGNING OUT AMA     Signed: 10/09/2019, 10:44 AM

## 2019-10-09 NOTE — Progress Notes (Signed)
*  PRELIMINARY RESULTS* Echocardiogram 2D Echocardiogram has been performed.  Carla Dickson 10/09/2019, 9:56 AM

## 2019-10-09 NOTE — Significant Event (Signed)
Pt requested to leave AMA. Dr. Belia Heman notified of patients request. Form was given to patient and signed. IV removed, taken off monitor and patient changed into personal clothes. All belongings sent with patient. Transported down to lobby with volunteer, husband to pick up patient at the front of the hospital.

## 2019-10-09 NOTE — Progress Notes (Signed)
Pharmacy Electrolyte Monitoring Consult:  Pharmacy consulted to assist in monitoring and replacing electrolytes in this 49 y.o. female admitted on 10/04/2019. Patient initially ruled out for meningitis. Patien has history of alcohol abuse, cirrhosis, and hypertension. Patient presented to ED with seizure like activity.   Labs:  Sodium (mmol/L)  Date Value  10/09/2019 136  10/22/2014 139   Potassium (mmol/L)  Date Value  10/09/2019 3.5  10/22/2014 3.3 (L)   Magnesium (mg/dL)  Date Value  09/47/0962 3.4 (H)   Phosphorus (mg/dL)  Date Value  83/66/2947 5.3 (H)   Calcium (mg/dL)  Date Value  65/46/5035 8.9   Calcium, Total (mg/dL)  Date Value  46/56/8127 8.6   Albumin (g/dL)  Date Value  51/70/0174 3.3 (L)  10/22/2014 4.0    Assessment/Plan: Patient received potassium PO x 1 on 1/11 and magnesium 4g IV overnight. Patient is planning on leaving AMA. Will defer all further replacement and complete consult.   Shari Natt L 10/09/2019 11:02 AM

## 2019-10-10 LAB — THYROID PANEL WITH TSH
Free Thyroxine Index: 3 (ref 1.2–4.9)
Free Thyroxine Index: 2.2 (ref 1.2–4.9)
T3 Uptake Ratio: 35 % (ref 24–39)
T3 Uptake Ratio: 35 % (ref 24–39)
T4, Total: 8.5 ug/dL (ref 4.5–12.0)
T4, Total: 6.2 ug/dL (ref 4.5–12.0)
TSH: 0.627 u[IU]/mL (ref 0.450–4.500)
TSH: 0.755 u[IU]/mL (ref 0.450–4.500)

## 2019-10-15 LAB — VITAMIN B1: Vitamin B1 (Thiamine): 268.1 nmol/L — ABNORMAL HIGH (ref 66.5–200.0)

## 2020-02-03 ENCOUNTER — Emergency Department: Payer: Self-pay

## 2020-02-03 ENCOUNTER — Inpatient Hospital Stay
Admission: EM | Admit: 2020-02-03 | Discharge: 2020-02-15 | DRG: 896 | Disposition: A | Discharge status: Home Health | Payer: Self-pay | Attending: Internal Medicine | Admitting: Internal Medicine

## 2020-02-03 ENCOUNTER — Other Ambulatory Visit: Payer: Self-pay

## 2020-02-03 DIAGNOSIS — Z7189 Other specified counseling: Secondary | ICD-10-CM

## 2020-02-03 DIAGNOSIS — G9340 Encephalopathy, unspecified: Secondary | ICD-10-CM | POA: Diagnosis present

## 2020-02-03 DIAGNOSIS — R569 Unspecified convulsions: Secondary | ICD-10-CM

## 2020-02-03 DIAGNOSIS — K219 Gastro-esophageal reflux disease without esophagitis: Secondary | ICD-10-CM | POA: Diagnosis present

## 2020-02-03 DIAGNOSIS — E7800 Pure hypercholesterolemia, unspecified: Secondary | ICD-10-CM | POA: Diagnosis present

## 2020-02-03 DIAGNOSIS — K7030 Alcoholic cirrhosis of liver without ascites: Secondary | ICD-10-CM | POA: Diagnosis present

## 2020-02-03 DIAGNOSIS — I10 Essential (primary) hypertension: Secondary | ICD-10-CM | POA: Diagnosis present

## 2020-02-03 DIAGNOSIS — F10931 Alcohol use, unspecified with withdrawal delirium: Secondary | ICD-10-CM

## 2020-02-03 DIAGNOSIS — F332 Major depressive disorder, recurrent severe without psychotic features: Secondary | ICD-10-CM | POA: Diagnosis present

## 2020-02-03 DIAGNOSIS — Z66 Do not resuscitate: Secondary | ICD-10-CM | POA: Diagnosis present

## 2020-02-03 DIAGNOSIS — D649 Anemia, unspecified: Secondary | ICD-10-CM | POA: Diagnosis present

## 2020-02-03 DIAGNOSIS — F411 Generalized anxiety disorder: Secondary | ICD-10-CM | POA: Diagnosis present

## 2020-02-03 DIAGNOSIS — G9389 Other specified disorders of brain: Secondary | ICD-10-CM | POA: Diagnosis present

## 2020-02-03 DIAGNOSIS — Z20822 Contact with and (suspected) exposure to covid-19: Secondary | ICD-10-CM | POA: Diagnosis present

## 2020-02-03 DIAGNOSIS — Z8673 Personal history of transient ischemic attack (TIA), and cerebral infarction without residual deficits: Secondary | ICD-10-CM

## 2020-02-03 DIAGNOSIS — Z515 Encounter for palliative care: Secondary | ICD-10-CM

## 2020-02-03 DIAGNOSIS — Z7982 Long term (current) use of aspirin: Secondary | ICD-10-CM

## 2020-02-03 DIAGNOSIS — E785 Hyperlipidemia, unspecified: Secondary | ICD-10-CM | POA: Diagnosis present

## 2020-02-03 DIAGNOSIS — F10231 Alcohol dependence with withdrawal delirium: Principal | ICD-10-CM | POA: Diagnosis present

## 2020-02-03 DIAGNOSIS — E440 Moderate protein-calorie malnutrition: Secondary | ICD-10-CM | POA: Insufficient documentation

## 2020-02-03 DIAGNOSIS — Z79899 Other long term (current) drug therapy: Secondary | ICD-10-CM

## 2020-02-03 DIAGNOSIS — G92 Toxic encephalopathy: Secondary | ICD-10-CM | POA: Diagnosis present

## 2020-02-03 DIAGNOSIS — Z818 Family history of other mental and behavioral disorders: Secondary | ICD-10-CM

## 2020-02-03 DIAGNOSIS — Z634 Disappearance and death of family member: Secondary | ICD-10-CM

## 2020-02-03 DIAGNOSIS — G319 Degenerative disease of nervous system, unspecified: Secondary | ICD-10-CM | POA: Diagnosis present

## 2020-02-03 DIAGNOSIS — W19XXXA Unspecified fall, initial encounter: Secondary | ICD-10-CM | POA: Diagnosis present

## 2020-02-03 DIAGNOSIS — Z87440 Personal history of urinary (tract) infections: Secondary | ICD-10-CM

## 2020-02-03 DIAGNOSIS — S01411A Laceration without foreign body of right cheek and temporomandibular area, initial encounter: Secondary | ICD-10-CM | POA: Diagnosis present

## 2020-02-03 DIAGNOSIS — E869 Volume depletion, unspecified: Secondary | ICD-10-CM | POA: Diagnosis present

## 2020-02-03 DIAGNOSIS — Y900 Blood alcohol level of less than 20 mg/100 ml: Secondary | ICD-10-CM | POA: Diagnosis present

## 2020-02-03 DIAGNOSIS — G40909 Epilepsy, unspecified, not intractable, without status epilepticus: Secondary | ICD-10-CM

## 2020-02-03 DIAGNOSIS — R1011 Right upper quadrant pain: Secondary | ICD-10-CM

## 2020-02-03 DIAGNOSIS — I639 Cerebral infarction, unspecified: Secondary | ICD-10-CM | POA: Diagnosis present

## 2020-02-03 DIAGNOSIS — F17210 Nicotine dependence, cigarettes, uncomplicated: Secondary | ICD-10-CM | POA: Diagnosis present

## 2020-02-03 DIAGNOSIS — Y92009 Unspecified place in unspecified non-institutional (private) residence as the place of occurrence of the external cause: Secondary | ICD-10-CM

## 2020-02-03 DIAGNOSIS — N390 Urinary tract infection, site not specified: Secondary | ICD-10-CM | POA: Diagnosis present

## 2020-02-03 HISTORY — DX: Unspecified convulsions: R56.9

## 2020-02-03 HISTORY — DX: Personal history of transient ischemic attack (TIA), and cerebral infarction without residual deficits: Z86.73

## 2020-02-03 LAB — URINALYSIS, COMPLETE (UACMP) WITH MICROSCOPIC
Bilirubin Urine: NEGATIVE
Glucose, UA: 150 mg/dL — AB
Ketones, ur: 5 mg/dL — AB
Nitrite: NEGATIVE
Protein, ur: 30 mg/dL — AB
Specific Gravity, Urine: 1.012 (ref 1.005–1.030)
pH: 7 (ref 5.0–8.0)

## 2020-02-03 LAB — MRSA PCR SCREENING: MRSA by PCR: POSITIVE — AB

## 2020-02-03 LAB — LACTIC ACID, PLASMA
Lactic Acid, Venous: 5.7 mmol/L (ref 0.5–1.9)
Lactic Acid, Venous: 2.8 mmol/L (ref 0.5–1.9)

## 2020-02-03 LAB — URINE DRUG SCREEN, QUALITATIVE (ARMC ONLY)
Amphetamines, Ur Screen: NOT DETECTED
Barbiturates, Ur Screen: NOT DETECTED
Benzodiazepine, Ur Scrn: POSITIVE — AB
Cannabinoid 50 Ng, Ur ~~LOC~~: NOT DETECTED
Cocaine Metabolite,Ur ~~LOC~~: NOT DETECTED
MDMA (Ecstasy)Ur Screen: NOT DETECTED
Methadone Scn, Ur: NOT DETECTED
Opiate, Ur Screen: NOT DETECTED
Phencyclidine (PCP) Ur S: NOT DETECTED
Tricyclic, Ur Screen: NOT DETECTED

## 2020-02-03 LAB — AMMONIA: Ammonia: 56 umol/L — ABNORMAL HIGH (ref 9–35)

## 2020-02-03 LAB — CBC
HCT: 32.4 % — ABNORMAL LOW (ref 36.0–46.0)
Hemoglobin: 11.2 g/dL — ABNORMAL LOW (ref 12.0–15.0)
MCH: 33.6 pg (ref 26.0–34.0)
MCHC: 34.6 g/dL (ref 30.0–36.0)
MCV: 97.3 fL (ref 80.0–100.0)
Platelets: 32 10*3/uL — ABNORMAL LOW (ref 150–400)
RBC: 3.33 MIL/uL — ABNORMAL LOW (ref 3.87–5.11)
RDW: 15.2 % (ref 11.5–15.5)
WBC: 7.7 10*3/uL (ref 4.0–10.5)
nRBC: 0.4 % — ABNORMAL HIGH (ref 0.0–0.2)

## 2020-02-03 LAB — ETHANOL: Alcohol, Ethyl (B): <10 mg/dL (ref <10)

## 2020-02-03 LAB — BASIC METABOLIC PANEL
Anion gap: 19 — ABNORMAL HIGH (ref 5–15)
BUN: 7 mg/dL (ref 6–20)
CO2: 21 mmol/L — ABNORMAL LOW (ref 22–32)
Calcium: 8.1 mg/dL — ABNORMAL LOW (ref 8.9–10.3)
Chloride: 95 mmol/L — ABNORMAL LOW (ref 98–111)
Creatinine, Ser: 0.63 mg/dL (ref 0.44–1.00)
GFR calc Af Amer: >60 mL/min (ref >60)
GFR calc non Af Amer: >60 mL/min (ref >60)
Glucose, Bld: 135 mg/dL — ABNORMAL HIGH (ref 70–99)
Potassium: 3.4 mmol/L — ABNORMAL LOW (ref 3.5–5.1)
Sodium: 135 mmol/L (ref 135–145)

## 2020-02-03 LAB — MAGNESIUM: Magnesium: 1 mg/dL — ABNORMAL LOW (ref 1.7–2.4)

## 2020-02-03 LAB — HEPATIC FUNCTION PANEL
ALT: 291 U/L — ABNORMAL HIGH (ref 0–44)
AST: 1279 U/L — ABNORMAL HIGH (ref 15–41)
Albumin: 3.6 g/dL (ref 3.5–5.0)
Alkaline Phosphatase: 212 U/L — ABNORMAL HIGH (ref 38–126)
Bilirubin, Direct: 3.1 mg/dL — ABNORMAL HIGH (ref 0.0–0.2)
Indirect Bilirubin: 2.6 mg/dL — ABNORMAL HIGH (ref 0.3–0.9)
Total Bilirubin: 5.7 mg/dL — ABNORMAL HIGH (ref 0.3–1.2)
Total Protein: 7.4 g/dL (ref 6.5–8.1)

## 2020-02-03 LAB — RESPIRATORY PANEL BY RT PCR (FLU A&B, COVID)
Influenza A by PCR: NEGATIVE
Influenza B by PCR: NEGATIVE
SARS Coronavirus 2 by RT PCR: NEGATIVE

## 2020-02-03 LAB — PROCALCITONIN: Procalcitonin: 0.27 ng/mL

## 2020-02-03 LAB — CK: Total CK: 212 U/L (ref 38–234)

## 2020-02-03 LAB — PHOSPHORUS: Phosphorus: 1.1 mg/dL — ABNORMAL LOW (ref 2.5–4.6)

## 2020-02-03 MED ORDER — PANTOPRAZOLE SODIUM 40 MG IV SOLR
40.0000 mg | Freq: Every day | INTRAVENOUS | Status: DC
  Administered 2020-02-03 – 2020-02-10 (×7): 40 mg via INTRAVENOUS
  Filled 2020-02-03 (×8): qty 40

## 2020-02-03 MED ORDER — SODIUM CHLORIDE 0.9 % IV SOLN
2.0000 g | INTRAVENOUS | Status: DC
  Administered 2020-02-04 – 2020-02-07 (×4): 2 g via INTRAVENOUS
  Filled 2020-02-03 (×2): qty 2
  Filled 2020-02-03 (×2): qty 20
  Filled 2020-02-03: qty 2

## 2020-02-03 MED ORDER — LORAZEPAM 2 MG/ML IJ SOLN
2.0000 mg | Freq: Once | INTRAMUSCULAR | Status: AC
  Administered 2020-02-03: 15:00:00 2 mg via INTRAVENOUS
  Filled 2020-02-03: qty 1

## 2020-02-03 MED ORDER — DEXMEDETOMIDINE HCL IN NACL 400 MCG/100ML IV SOLN
0.4000 ug/kg/h | INTRAVENOUS | Status: DC
  Administered 2020-02-03: 17:00:00 0.4 ug/kg/h via INTRAVENOUS
  Administered 2020-02-03: 23:00:00 1 ug/kg/h via INTRAVENOUS
  Administered 2020-02-04: 05:00:00 0.6 ug/kg/h via INTRAVENOUS
  Administered 2020-02-04: 21:00:00 0.4 ug/kg/h via INTRAVENOUS
  Filled 2020-02-03 (×4): qty 100

## 2020-02-03 MED ORDER — LEVETIRACETAM IN NACL 1000 MG/100ML IV SOLN
1000.0000 mg | Freq: Once | INTRAVENOUS | Status: AC
  Administered 2020-02-03: 14:00:00 1000 mg via INTRAVENOUS
  Filled 2020-02-03: qty 100

## 2020-02-03 MED ORDER — LORAZEPAM 2 MG/ML IJ SOLN
2.0000 mg | Freq: Once | INTRAMUSCULAR | Status: AC
  Administered 2020-02-03: 2 mg via INTRAVENOUS
  Filled 2020-02-03: qty 1

## 2020-02-03 MED ORDER — ALBUTEROL SULFATE (2.5 MG/3ML) 0.083% IN NEBU: 2.5000 mg | INHALATION_SOLUTION | RESPIRATORY_TRACT | Status: DC | PRN

## 2020-02-03 MED ORDER — MUPIROCIN 2 % EX OINT
1.0000 "application " | TOPICAL_OINTMENT | Freq: Two times a day (BID) | CUTANEOUS | Status: AC
  Administered 2020-02-03 – 2020-02-08 (×10): 1 via NASAL
  Filled 2020-02-03: qty 22

## 2020-02-03 MED ORDER — DOCUSATE SODIUM 100 MG PO CAPS: 100.0000 mg | ORAL_CAPSULE | Freq: Two times a day (BID) | ORAL | Status: DC | PRN

## 2020-02-03 MED ORDER — SODIUM CHLORIDE 0.9 % IV SOLN
2.0000 g | Freq: Once | INTRAVENOUS | Status: AC
  Administered 2020-02-03: 18:00:00 2 g via INTRAVENOUS
  Filled 2020-02-03: qty 20

## 2020-02-03 MED ORDER — POLYETHYLENE GLYCOL 3350 17 G PO PACK
17.0000 g | PACK | Freq: Every day | ORAL | Status: DC | PRN
  Administered 2020-02-14: 17 g via ORAL
  Filled 2020-02-03: qty 1

## 2020-02-03 MED ORDER — ONDANSETRON HCL 4 MG/2ML IJ SOLN
4.0000 mg | Freq: Four times a day (QID) | INTRAMUSCULAR | Status: DC | PRN
  Administered 2020-02-05 – 2020-02-10 (×2): 4 mg via INTRAVENOUS
  Filled 2020-02-03 (×2): qty 2

## 2020-02-03 MED ORDER — LACTATED RINGERS IV SOLN: INTRAVENOUS | Status: DC

## 2020-02-03 MED ORDER — SODIUM CHLORIDE 0.9 % IV SOLN: 2.0000 g | Freq: Once | INTRAVENOUS | Status: DC

## 2020-02-03 MED ORDER — THIAMINE HCL 100 MG/ML IJ SOLN
500.0000 mg | INTRAVENOUS | Status: AC
  Administered 2020-02-03 – 2020-02-07 (×5): 500 mg via INTRAVENOUS
  Filled 2020-02-03 (×5): qty 5

## 2020-02-03 MED ORDER — VANCOMYCIN HCL 750 MG/150ML IV SOLN
750.0000 mg | Freq: Once | INTRAVENOUS | Status: AC
  Administered 2020-02-03: 19:00:00 750 mg via INTRAVENOUS
  Filled 2020-02-03: qty 150

## 2020-02-03 MED ORDER — LORAZEPAM 2 MG/ML IJ SOLN
0.0000 mg | Freq: Two times a day (BID) | INTRAMUSCULAR | Status: AC
  Administered 2020-02-05 – 2020-02-07 (×4): 2 mg via INTRAVENOUS
  Filled 2020-02-03: qty 2
  Filled 2020-02-03 (×4): qty 1

## 2020-02-03 MED ORDER — VANCOMYCIN HCL IN DEXTROSE 1-5 GM/200ML-% IV SOLN
1000.0000 mg | Freq: Once | INTRAVENOUS | Status: AC
  Administered 2020-02-03: 1000 mg via INTRAVENOUS
  Filled 2020-02-03: qty 200

## 2020-02-03 MED ORDER — LORAZEPAM 2 MG PO TABS: 0.0000 mg | ORAL_TABLET | Freq: Two times a day (BID) | ORAL | Status: AC

## 2020-02-03 MED ORDER — LORAZEPAM 2 MG PO TABS: 0.0000 mg | ORAL_TABLET | Freq: Four times a day (QID) | ORAL | Status: AC

## 2020-02-03 MED ORDER — CHLORHEXIDINE GLUCONATE CLOTH 2 % EX PADS
6.0000 | MEDICATED_PAD | Freq: Every day | CUTANEOUS | Status: DC
  Administered 2020-02-03 – 2020-02-14 (×6): 6 via TOPICAL

## 2020-02-03 MED ORDER — CHLORHEXIDINE GLUCONATE CLOTH 2 % EX PADS
6.0000 | MEDICATED_PAD | Freq: Every day | CUTANEOUS | Status: AC
  Administered 2020-02-04 – 2020-02-08 (×5): 6 via TOPICAL

## 2020-02-03 MED ORDER — MAGNESIUM SULFATE 4 GM/100ML IV SOLN
4.0000 g | Freq: Once | INTRAVENOUS | Status: AC
  Administered 2020-02-04: 4 g via INTRAVENOUS
  Filled 2020-02-03: qty 100

## 2020-02-03 MED ORDER — ACETAMINOPHEN 650 MG RE SUPP
650.0000 mg | Freq: Once | RECTAL | Status: AC
  Administered 2020-02-03: 15:00:00 650 mg via RECTAL
  Filled 2020-02-03: qty 1

## 2020-02-03 MED ORDER — FOLIC ACID 5 MG/ML IJ SOLN
1.0000 mg | Freq: Every day | INTRAMUSCULAR | Status: DC
  Administered 2020-02-03 – 2020-02-10 (×8): 1 mg via INTRAVENOUS
  Filled 2020-02-03 (×10): qty 0.2

## 2020-02-03 MED ORDER — LORAZEPAM 2 MG/ML IJ SOLN
0.0000 mg | Freq: Four times a day (QID) | INTRAMUSCULAR | Status: AC
  Administered 2020-02-03: 15:00:00 2 mg via INTRAVENOUS
  Administered 2020-02-04: 1 mg via INTRAVENOUS
  Administered 2020-02-04: 12:00:00 2 mg via INTRAVENOUS
  Filled 2020-02-03 (×3): qty 1

## 2020-02-03 MED ORDER — LORAZEPAM 2 MG/ML IJ SOLN
2.0000 mg | Freq: Once | INTRAMUSCULAR | Status: AC
  Administered 2020-02-03: 16:00:00 2 mg via INTRAVENOUS
  Filled 2020-02-03: qty 1

## 2020-02-03 MED ORDER — POTASSIUM CHLORIDE 2 MEQ/ML IV SOLN
INTRAVENOUS | Status: DC
  Filled 2020-02-03 (×3): qty 1000

## 2020-02-03 MED ORDER — LEVETIRACETAM IN NACL 500 MG/100ML IV SOLN
500.0000 mg | Freq: Two times a day (BID) | INTRAVENOUS | Status: DC
  Administered 2020-02-03 – 2020-02-10 (×15): 500 mg via INTRAVENOUS
  Filled 2020-02-03 (×17): qty 100

## 2020-02-03 MED ORDER — SODIUM CHLORIDE 0.9 % IV BOLUS
1000.0000 mL | Freq: Once | INTRAVENOUS | Status: AC
  Administered 2020-02-03: 14:00:00 1000 mL via INTRAVENOUS

## 2020-02-03 MED ORDER — HEPARIN SODIUM (PORCINE) 5000 UNIT/ML IJ SOLN
5000.0000 [IU] | Freq: Three times a day (TID) | INTRAMUSCULAR | Status: DC
  Administered 2020-02-03 – 2020-02-04 (×3): 5000 [IU] via SUBCUTANEOUS
  Filled 2020-02-03 (×3): qty 1

## 2020-02-03 MED ORDER — POTASSIUM PHOSPHATES 15 MMOLE/5ML IV SOLN
20.0000 mmol | Freq: Once | INTRAVENOUS | Status: AC
  Administered 2020-02-04: 20 mmol via INTRAVENOUS
  Filled 2020-02-03: qty 6.67

## 2020-02-03 MED ORDER — SODIUM CHLORIDE 0.9 % IV SOLN
2.0000 g | Freq: Once | INTRAVENOUS | Status: DC
  Filled 2020-02-03: qty 2

## 2020-02-03 NOTE — ED Notes (Signed)
Pt transported to CT ?

## 2020-02-03 NOTE — ED Notes (Signed)
Report given to [place], [place]

## 2020-02-03 NOTE — ED Notes (Signed)
Seizure pads placed on bed

## 2020-02-03 NOTE — Progress Notes (Signed)
Patient arrived to the unit responsive to voice.  Upon arrival patient was able to verify her first name.  Patient was placed in ICU bed with oxygen at bedside and suction ready in case of seizure activity.  Bed rails were padded for safety.  CIWA protocol has been started.  Delivered 1st dose of rocephin and 2nd dose of vancomycin.  Father and Brother at bedside.

## 2020-02-03 NOTE — H&P (Signed)
Carla Dickson is an 49 y.o. female.   Chief Complaint: Found down unresponsive at home HPI: This is a 49 year old woman the as noted below including seizure disorder and alcohol abuse who presented via EMS after she was found on the floor at home unresponsive.  The patient is noted to have abrasions and laceration on the right cheek and a hematoma on the right side of her face.  She has ecchymoses under her left eye.  It was presumed she had a fall.  Patient's family was present when EMS presented to the home.  They stated that she has had previous seizures from alcohol withdrawal previously.  Onset of seizures was approximately a little over a year ago.  Patient is no seizure medications.  In the emergency room the patient is noted to be for the most part unresponsive she did become agitated and required Ativan up to 8 mg IV.  She is currently in the fetal position and will not interact.  She was noted to have a fever and possible urinary tract infection and antibiotics have been started mainly vancomycin and Rocephin.  She has had E. coli UTI previously.  She had a similar presentation in January of this year.  She was admitted by our group in similar circumstance.  At that time LP was considered however, the patient's combativeness precluded doing this.  The patient at that time left AMA after she improved.  She is apparently not taking any medications at home.  Work-up in the emergency room has shown laboratory data as below.  CT scan of the head shows a possible new focus of indeterminate age infarct is low ganglia.  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  C-spine was cleared.  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  Urinalysis appears to be consistent with UTI.  Remainder of laboratory data is either pending or unremarkable.  Drug screen was positive for opiates alcohol level less than 10.   Patient received vancomycin x1 and Rocephin x1 in the ED.  She was loaded with Keppra.  Past Medical History:  Diagnosis Date  . Alcohol use   . Fracture of fifth metacarpal bone of left hand with routine healing   . Hypercholesteremia   . Hypertension   . Reflux   . Seizures (HCC)   . 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 ABDOMINAL HYSTERECTOMY      Family History  Problem Relation Age of Onset  . Breast cancer Maternal Grandmother    Social History:  reports that she has been smoking cigarettes. She has been smoking about 0.50 packs per day. She has never used smokeless tobacco. She reports current alcohol use of about 26.0 standard drinks of alcohol per week. She reports that she does not use drugs.  Allergies: No Known Allergies  No current outpatient medications    Results for orders placed or performed during the hospital encounter of 02/03/20 (from the past 48 hour(s))  Ethanol     Status: None   Collection Time: 02/03/20  2:12 PM  Result Value Ref Range   Alcohol, Ethyl (B) <10 <10 mg/dL    Comment: (NOTE) Lowest detectable limit for serum alcohol is 10 mg/dL. For medical purposes only. Performed at [facility], [address], [place], [place] [address]   Basic metabolic  panel     Status: Abnormal   Collection Time: 02/03/20  2:12 PM  Result Value Ref Range   Sodium 135 135 - 145 mmol/L   Potassium 3.4 (L) 3.5 - 5.1 mmol/L    Comment: HEMOLYSIS AT THIS LEVEL MAY AFFECT RESULT   Chloride 95 (L) 98 - 111 mmol/L   CO2 21 (L) 22 - 32 mmol/L   Glucose, Bld 135 (H) 70 - 99 mg/dL    Comment: Glucose reference range applies only to samples taken after fasting for at least 8 hours.   BUN 7 6 - 20 mg/dL   Creatinine, Ser 1.320.63 0.44 - 1.00 mg/dL   Calcium 8.1 (L) 8.9 - 10.3 mg/dL   GFR calc non Af Amer >60 >60 mL/min   GFR calc Af Amer >60 >60 mL/min    Anion gap 19 (H) 5 - 15    Comment: Performed at [facility], [address] [place], [place] [address]  CBC     Status: Abnormal   Collection Time: 02/03/20  2:12 PM  Result Value Ref Range   WBC 7.7 4.0 - 10.5 K/uL   RBC 3.33 (L) 3.87 - 5.11 MIL/uL   Hemoglobin 11.2 (L) 12.0 - 15.0 g/dL   HCT 27.232.4 (L) 53.636.0 - 64.446.0 %   MCV 97.3 80.0 - 100.0 fL   MCH 33.6 26.0 - 34.0 pg   MCHC 34.6 30.0 - 36.0 g/dL   RDW 03.415.2 74.211.5 - 59.515.5 %   Platelets 32 (L) 150 - 400 K/uL    Comment: Immature Platelet Fraction may be clinically indicated, consider ordering this additional test [id number] PLATELET COUNT CONFIRMED BY SMEAR    nRBC 0.4 (H) 0.0 - 0.2 %    Comment: Performed at [facility], [address] [place], [place] [address]  Urinalysis, Complete w Microscopic     Status: Abnormal   Collection Time: 02/03/20  2:29 PM  Result Value Ref Range   Color, Urine YELLOW (A) YELLOW   APPearance CLEAR (A) CLEAR   Specific Gravity, Urine 1.012 1.005 - 1.030   pH 7.0 5.0 - 8.0   Glucose, UA 150 (A) NEGATIVE mg/dL   Hgb urine dipstick MODERATE (A) NEGATIVE   Bilirubin Urine NEGATIVE NEGATIVE   Ketones, ur 5 (A) NEGATIVE mg/dL   Protein, ur 30 (A) NEGATIVE mg/dL   Nitrite NEGATIVE NEGATIVE   Leukocytes,Ua SMALL (A) NEGATIVE   RBC / HPF 0-5 0 - 5 RBC/hpf   WBC, UA 6-10 0 - 5 WBC/hpf   Bacteria, UA RARE (A) NONE SEEN   Squamous Epithelial / LPF 0-5 0 - 5   Mucus PRESENT     Comment: Performed at [facility], [address] [place], [place] [address]  Urine Drug Screen, Qualitative     Status: Abnormal   Collection Time: 02/03/20  2:29 PM  Result Value Ref Range   Tricyclic, Ur Screen NONE DETECTED NONE DETECTED   Amphetamines, Ur Screen NONE DETECTED NONE DETECTED   MDMA (Ecstasy)Ur Screen NONE DETECTED NONE DETECTED   Cocaine Metabolite,Ur [place] NONE DETECTED NONE DETECTED   Opiate, Ur Screen NONE DETECTED NONE DETECTED   Phencyclidine (PCP) Ur S NONE DETECTED NONE  DETECTED   Cannabinoid 50 Ng, Ur [place] NONE DETECTED NONE DETECTED   Barbiturates, Ur Screen NONE DETECTED NONE DETECTED   Benzodiazepine, Ur Scrn POSITIVE (A) NONE DETECTED   Methadone Scn, Ur NONE DETECTED NONE DETECTED    Comment: (NOTE) Tricyclics + metabolites, urine  Cutoff 1000 ng/mL Amphetamines + metabolites, urine  Cutoff 1000 ng/mL MDMA (Ecstasy), urine              Cutoff 500 ng/mL Cocaine Metabolite, urine          Cutoff 300 ng/mL Opiate + metabolites, urine        Cutoff 300 ng/mL Phencyclidine (PCP), urine         Cutoff 25 ng/mL Cannabinoid, urine                 Cutoff 50 ng/mL Barbiturates + metabolites, urine  Cutoff 200 ng/mL Benzodiazepine, urine              Cutoff 200 ng/mL Methadone, urine                   Cutoff 300 ng/mL The urine drug screen provides only a preliminary, unconfirmed analytical test result and should not be used for non-medical purposes. Clinical consideration and professional judgment should be applied to any positive drug screen result due to possible interfering substances. A more specific alternate chemical method must be used in order to obtain a confirmed analytical result. Gas chromatography / mass spectrometry (GC/MS) is the preferred confirmat ory method. Performed at [facility], [address] [place], [place] [address]   Respiratory Panel by RT PCR (Flu A&B, Covid) - Nasopharyngeal Swab     Status: None   Collection Time: 02/03/20  3:34 PM   Specimen: Nasopharyngeal Swab  Result Value Ref Range   SARS Coronavirus 2 by RT PCR NEGATIVE NEGATIVE    Comment: (NOTE) SARS-CoV-2 target nucleic acids are NOT DETECTED. The SARS-CoV-2 RNA is generally detectable in upper respiratoy specimens during the acute phase of infection. The lowest concentration of SARS-CoV-2 viral copies this assay can detect is 131 copies/mL. A negative result does not preclude SARS-Cov-2 infection and should not be used as the sole basis for  treatment or other patient management decisions. A negative result may occur with  improper specimen collection/handling, submission of specimen other than nasopharyngeal swab, presence of viral mutation(s) within the areas targeted by this assay, and inadequate number of viral copies (<131 copies/mL). A negative result must be combined with clinical observations, patient history, and epidemiological information. The expected result is Negative. Fact Sheet for Patients:  https://www.moore.com/ Fact Sheet for Healthcare Providers:  https://www.young.biz/ This test is not yet ap proved or cleared by the [place] FDA and  has been authorized for detection and/or diagnosis of SARS-CoV-2 by FDA under an Emergency Use Authorization (EUA). This EUA will remain  in effect (meaning this test can be used) for the duration of the COVID-19 declaration under Section 564(b)(1) of the Act, 21 U.S.C. section 360bbb-3(b)(1), unless the authorization is terminated or revoked sooner.    Influenza A by PCR NEGATIVE NEGATIVE   Influenza B by PCR NEGATIVE NEGATIVE    Comment: (NOTE) The Xpert Xpress SARS-CoV-2/FLU/RSV assay is intended as an aid in  the diagnosis of influenza from Nasopharyngeal swab specimens and  should not be used as a sole basis for treatment. Nasal washings and  aspirates are unacceptable for Xpert Xpress SARS-CoV-2/FLU/RSV  testing. Fact Sheet for Patients: https://www.moore.com/ Fact Sheet for Healthcare Providers: https://www.young.biz/ This test is not yet approved or cleared by the [place] FDA and  has been authorized for detection and/or diagnosis of SARS-CoV-2 by  FDA under an Emergency Use Authorization (EUA). This EUA will remain  in effect (meaning this test can be used)  for the duration of the  Covid-19 declaration under Section 564(b)(1) of the Act, 21  U.S.C. section  360bbb-3(b)(1), unless the authorization is  terminated or revoked. Performed at [facility], [address], [place], [place] [address]    CT Head Wo Contrast  Result Date: 02/03/2020 CLINICAL DATA:  Seizure with head trauma. EXAM: CT HEAD WITHOUT CONTRAST TECHNIQUE: Contiguous axial images were obtained from the base of the skull through the vertex without intravenous contrast. COMPARISON:  October 04, 2019 FINDINGS: Brain: No evidence of acute hemorrhage, hydrocephalus, extra-axial collection or mass lesion/mass effect. New focus of hypoattenuation in the left basal ganglia. 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 Stable moderate bilateral parietal lobe atrophy. Mild periventricular white matter microvascular disease. Vascular: No hyperdense vessel or unexpected calcification. Skull: Normal. Negative for fracture or focal lesion. Sinuses/Orbits: No acute finding. Other: None. IMPRESSION: 1. New focus of hypoattenuation in the left basal ganglia, which may represent an age-indeterminate infarct/asymmetric micro vascular disease. 2. 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 Smaller lacunar infarcts demonstrated by prior MRI are not seen on today's exam. 3. Mild periventricular white matter microvascular disease and parenchymal volume loss. 4. No evidence of intracranial hemorrhage. Electronically Signed   By: Ted Mcalpine M.D.   On: 02/03/2020 14:29   CT Cervical Spine Wo Contrast  Result Date: 02/03/2020 CLINICAL DATA:  Found on the floor. Suspected seizure activity. EXAM: CT CERVICAL SPINE WITHOUT CONTRAST TECHNIQUE: Multidetector CT imaging of the cervical spine was performed without intravenous contrast. Multiplanar CT image reconstructions were also generated. COMPARISON:  Pain 12/15/2018 FINDINGS: Alignment: Straightening of the cervical lordosis. Skull base and vertebrae: No acute fracture. No primary bone lesion or focal  pathologic process. Soft tissues and spinal canal: No prevertebral fluid or swelling. No visible canal hematoma. Disc levels:  Multilevel osteoarthritic changes. Upper chest: Negative. Other: None. IMPRESSION: 1. No evidence of acute traumatic injury to the cervical spine. 2. Multilevel osteoarthritic changes of the cervical spine. Electronically Signed   By: Ted Mcalpine M.D.   On: 02/03/2020 14:32   DG Chest Portable 1 View  Result Date: 02/03/2020 CLINICAL DATA:  Seizure. EXAM: PORTABLE CHEST 1 VIEW COMPARISON:  October 04, 2019 FINDINGS: The heart size and mediastinal contours are within normal limits. Both lungs are clear. The visualized skeletal structures are unremarkable. IMPRESSION: No active disease. Electronically Signed   By: Aram Candela M.D.   On: 02/03/2020 15:26   CT Maxillofacial Wo Contrast  Result Date: 02/03/2020 CLINICAL DATA:  Status post fall. Suspected seizure activity. EXAM: CT MAXILLOFACIAL WITHOUT CONTRAST TECHNIQUE: Multidetector CT imaging of the maxillofacial structures was performed. Multiplanar CT image reconstructions were also generated. COMPARISON:  Jan 28, 2019 FINDINGS: Osseous: No fracture or mandibular dislocation. No destructive process. Orbits: Negative. No traumatic or inflammatory finding. Sinuses: Findings of chronic left maxillary sinusitis with bony remodeling and chronic mucosal thickening. Soft tissues: Mild right-sided facial bruising. Limited intracranial: No significant or unexpected finding. IMPRESSION: 1. No evidence of facial fractures. 2. Findings of chronic left maxillary sinusitis. Electronically Signed   By: Ted Mcalpine M.D.   On: 02/03/2020 14:37    Review of Systems  Unable to perform ROS: Mental status change    Blood pressure (!) 138/97, pulse (!) 130, temperature (!) 102.8 F (39.3 C), temperature source Axillary, resp. rate (!) 21, height 5\' 4"  (1.626 m), weight 57.7 kg, SpO2 98 %. Physical Exam  GENERAL: Very thin  woman, no respiratory distress, not  interactive, laying in fetal position. HEAD: Normocephalic, she has lacerations and bruises on the right side of her face.  Preorbital hematoma on the left. EYES: Difficult to assess due to the patient being in fetal position and not cooperative.  No scleral icterus.  MOUTH: Patient not cooperative appears to have poor dentition.  Oral mucosa appears dry NECK: Again patient laying in fetal position difficult to assess though there appears to be no nuchal rigidity.  Achy midline. PULMONARY: Coarse breath sounds bilaterally posteriorly, no other adventitious sounds. CARDIOVASCULAR: S1 and S2.  Tachycardic with regular rhythm.  Grade 2/6 systolic ejection murmur left sternal border. GASTROINTESTINAL: Abdomen soft, nondistended, no apparent tenderness.  Bowel sounds present. MUSCULOSKELETAL: Difficult to assess due to the patient being in fetal position.  No clubbing, no edema.  NEUROLOGIC: Not interactive, occasionally moans.  Apparently was agitated previously.  Per ER staff did not appear to have focal deficit. SKIN: Intact,warm,dry.  No overt rashes noted PSYCH: Cannot assess patient not interactive.   Assessment/Plan:  Acute encephalopathy with agitation Query seizure from alcohol withdrawal New focus of potential subacute infarct in the basal ganglia Mid to ICU/stepdown Frequent neuro checks Check prolactin level (seizure marker) Check CPK CIWA protocol Precedex infusion for delirium/agitation Continue Keppra Neurology evaluation in the a.m. EEG in a.m.  Hypertension Likely from alcohol withdrawal Volume depletion likely Monitor vital signs IV fluids  Fever Post seizure vs infection Urine consistent with UTI Has had prior E. coli UTI Trend WBCs and procalcitonin Pancultured Continue Rocephin Chest x-ray clear  History of alcoholic cirrhosis Chronic alcoholism with alcohol withdrawal GERD Obtain hepatic panel Check ammonia  level CIWA protocol as above High-dose thiamine PPI    Disposition: ICU Goals of care: Full code VTE prophylaxis: Heparin subcu Updates: No family available for update 02/03/2020    Critical care time: 60 minutes   C. Derrill Kay, MD [facility]  02/03/2020, 5:32 PM  *This note was dictated using voice recognition software/Dragon.  Despite best efforts to proofread, errors can occur which can change the meaning.  Any change was purely unintentional.

## 2020-02-03 NOTE — Consult Note (Signed)
PHARMACY -  BRIEF ANTIBIOTIC NOTE   Pharmacy has received consult(s) for sepsis from an ED provider.  The patient's profile has been reviewed for ht/wt/allergies/indication/available labs.    One time order(s) placed for vancomycin  Further antibiotics/pharmacy consults should be ordered by admitting physician if indicated.                       Thank you, Ronnald Ramp 02/03/2020  3:14 PM

## 2020-02-03 NOTE — ED Notes (Signed)
Unable to obtain second set of cultures at this time 

## 2020-02-03 NOTE — ED Notes (Signed)
Pt began to seize- Dr Katharina Caper called to bedside- ativan given- Dr Marisa Severin placed pt on 2L [place]

## 2020-02-03 NOTE — ED Notes (Signed)
Recollect tubes sent to lab.  

## 2020-02-03 NOTE — ED Provider Notes (Signed)
[facility] Emergency Department Provider Note ____________________________________________   First MD Initiated Contact with Patient 02/03/20 1252     (approximate)  I have reviewed the triage vital signs and the nursing notes.   HISTORY  Chief Complaint Seizures  Level 5 caveat: History of present illness limited due to altered mental status  HPI Carla Dickson is a 49 y.o. female with PMH as noted below including seizure disorder and alcohol abuse who presents after she was found on the floor at home unresponsive.  The patient was noted to have abrasions and hematoma to the right side of her face after a fall.  EMS reports that the patient's family members stated that she previously has had seizures when withdrawing from alcohol or drugs.  She received intranasal Versed by EMS.  Past Medical History:  Diagnosis Date  . Alcohol use   . Fracture of fifth metacarpal bone of left hand with routine healing   . Hypercholesteremia   . Hypertension   . Reflux   . Seizures [facility])     Patient Active Problem List   Diagnosis Date Noted  . Alcohol withdrawal syndrome, with delirium (HCC)   . Encephalopathy 10/04/2019  . GERD (gastroesophageal reflux disease) 01/28/2019  . HTN (hypertension) 01/28/2019  . HLD (hyperlipidemia) 01/28/2019  . Alcohol use 01/28/2019  . Fall 01/28/2019  . SIRS (systemic inflammatory response syndrome) (HCC) 01/28/2019    Past Surgical History:  Procedure Laterality Date  . ABDOMINAL HYSTERECTOMY      Prior to Admission medications   Not on File    Allergies Patient has no known allergies.  Family History  Problem Relation Age of Onset  . Breast cancer Maternal Grandmother     Social History Social History   Tobacco Use  . Smoking status: Current Every Day Smoker    Packs/day: 0.50    Types: Cigarettes  . Smokeless tobacco: Never Used  Substance Use Topics  . Alcohol use: Yes    Alcohol/week: 26.0  standard drinks    Types: 2 Cans of beer, 24 Shots of liquor per week    Comment: 2-3 drinks liquor per day  . Drug use: No    Review of Systems Level 5 caveat: Unable to obtain review of systems due to altered mental status   ____________________________________________   PHYSICAL EXAM:  VITAL SIGNS: ED Triage Vitals  Enc Vitals Group     BP 02/03/20 1246 (!) 171/97     Pulse Rate 02/03/20 1241 (!) 145     Resp 02/03/20 1241 (!) 22     Temp --      Temp src --      SpO2 02/03/20 1246 97 %     Weight 02/03/20 1242 150 lb (68 kg)     Height 02/03/20 1242 5\' 6"  (1.676 m)     Head Circumference --      Peak Flow --      Pain Score --      Pain Loc --      Pain Edu? --      Excl. in GC? --     Constitutional: Alert, confused.  Not really interacting, not following commands. Eyes: Conjunctivae are normal.  EOMI.  PERRLA. Head: Abrasions and bruising to the right maxillary and mandibular face. Nose: No congestion/rhinnorhea. Mouth/Throat: Mucous membranes are moist.   Neck: Normal range of motion.  Cardiovascular: Tachycardic, regular rhythm. Grossly normal heart sounds.  Good peripheral circulation. Respiratory: Normal respiratory effort.  No retractions. Lungs CTAB. Gastrointestinal: Soft and nontender. No distention.  Genitourinary: No flank tenderness. Musculoskeletal: Extremities warm and well perfused.  Neurologic: Motor intact in all extremities. Skin:  Skin is warm and dry. No rash noted. Psychiatric: Unable to assess  ____________________________________________   LABS (all labs ordered are listed, but only abnormal results are displayed)  Labs Reviewed  URINALYSIS, COMPLETE (UACMP) WITH MICROSCOPIC - Abnormal; Notable for the following components:      Result Value   Color, Urine YELLOW (*)    APPearance CLEAR (*)    Glucose, UA 150 (*)    Hgb urine dipstick MODERATE (*)    Ketones, ur 5 (*)    Protein, ur 30 (*)    Leukocytes,Ua SMALL (*)     Bacteria, UA RARE (*)    All other components within normal limits  URINE DRUG SCREEN, QUALITATIVE ([facility] ONLY) - Abnormal; Notable for the following components:   Benzodiazepine, Ur Scrn POSITIVE (*)    All other components within normal limits  BASIC METABOLIC PANEL - Abnormal; Notable for the following components:   Potassium 3.4 (*)    Chloride 95 (*)    CO2 21 (*)    Glucose, Bld 135 (*)    Calcium 8.1 (*)    Anion gap 19 (*)    All other components within normal limits  CBC - Abnormal; Notable for the following components:   RBC 3.33 (*)    Hemoglobin 11.2 (*)    HCT 32.4 (*)    Platelets 32 (*)    nRBC 0.4 (*)    All other components within normal limits  CULTURE, BLOOD (ROUTINE X 2)  CULTURE, BLOOD (ROUTINE X 2)  RESPIRATORY PANEL BY RT PCR (FLU A&B, COVID)  ETHANOL  LACTIC ACID, PLASMA  LACTIC ACID, PLASMA   ____________________________________________  EKG  ED ECG REPORT I, Arta Silence, the attending physician, personally viewed and interpreted this ECG.  Date: 02/03/2020 EKG Time: 1242 Rate: 128 Rhythm: normal sinus rhythm QRS Axis: Borderline left axis Intervals: normal ST/T Wave abnormalities: Nonspecific ST abnormalities Narrative Interpretation: Nonspecific abnormalities with no evidence of acute ischemia  ____________________________________________  RADIOLOGY  CT head: Age-indeterminate infarct in the left basal ganglia no ICH or other acute intracranial findings CT cervical spine: No acute fracture CT maxillofacial: No acute fracture CXR: No focal infiltrate or other acute abnormality ____________________________________________   PROCEDURES  Procedure(s) performed: No  Procedures  Critical Care performed: Yes  CRITICAL CARE Performed by: Arta Silence   Total critical care time: 60 minutes  Critical care time was exclusive of separately billable procedures and treating other patients.  Critical care was necessary to  treat or prevent imminent or life-threatening deterioration.  Critical care was time spent personally by me on the following activities: development of treatment plan with patient and/or surrogate as well as nursing, discussions with consultants, evaluation of patient's response to treatment, examination of patient, obtaining history from patient or surrogate, ordering and performing treatments and interventions, ordering and review of laboratory studies, ordering and review of radiographic studies, pulse oximetry and re-evaluation of patient's condition. ____________________________________________   INITIAL IMPRESSION / ASSESSMENT AND PLAN / ED COURSE  Pertinent labs & imaging results that were available during my care of the patient were reviewed by me and considered in my medical decision making (see chart for details).  49 year old female with PMH as noted above including alcohol abuse and seizure disorder presents with seizures.  The patient was found on the floor with  some trauma to the right side of her face and had a seizure with EMS.  She received Versed in the field.  I reviewed the past medical records in epic.  The patient was last admitted in February due to seizures, alcohol withdrawal, and at that time was febrile with a concern for meningitis or other source of sepsis.  Previously she was admitted around 1 year ago with a fall, and was admitted for sepsis work-up.  On exam currently the patient is awake but confused, nonverbal, and not really following commands.  She is moving all 4 extremities.  She is hypertensive and tachycardic.  She has abrasions and bruising to the right side of her face, likely from a fall today.  Overall presentation is consistent with seizures, and I am concerned about alcohol withdrawal.  Infection/sepsis is less likely.  Differential also includes other toxicologic or metabolic etiologies.  Will obtain CT head, cervical spine, and maxillofacial, obtain  lab work-up, give IV Ativan and Keppra, placed the patient on CIWA protocol, and plan for admission.  ----------------------------------------- 1:49 PM on 02/03/2020 -----------------------------------------  The patient had another seizure prior to getting the Ativan that was ordered.  It lasted a short time and she is now no longer seizing, with a similar mental status to that when she arrived.  We will continue with Ativan and Keppra and monitor closely.  ----------------------------------------- 3:48 PM on 02/03/2020 -----------------------------------------  The patient has had no further seizure activity.  She remains in an agitated delirium.  The heart rate is in the 130s to 140s and the blood pressure is still elevated.  She has now received a total of 8 mg of Ativan.  The patient is febrile.  She presented similarly during her most recent admission, was ultimately never able to tolerate an LP (although she was covered with antibiotics and acyclovir for meningitis) and had no evidence of specific infection.  The fever may be related to the patient's withdrawal and psychomotor agitation.  At this time, there is no clinical evidence for meningitis.  Urinalysis is suggestive of UTI.  I have ordered vancomycin and ceftriaxone to cover for UTI and the remote chance of CNS infection.  I added lactate and blood cultures to further evaluate.  Given the patient's persistent delirium, abnormal vital signs, and requirement for high doses of benzos, I discussed the case with Dr. Jayme Cloud from the ICU for admission.  She agrees with admission to the ICU.  I also discussed the case with Dr. Fanny Bien who is taking over in the ED.  __________________________  Karren Burly was evaluated in Emergency Department on 02/03/2020 for the symptoms described in the history of present illness. She was evaluated in the context of the global COVID-19 pandemic, which necessitated consideration that the patient  might be at risk for infection with the SARS-CoV-2 virus that causes COVID-19. Institutional protocols and algorithms that pertain to the evaluation of patients at risk for COVID-19 are in a state of rapid change based on information released by regulatory bodies including the CDC and federal and state organizations. These policies and algorithms were followed during the patient's care in the ED. ____________________________________________   FINAL CLINICAL IMPRESSION(S) / ED DIAGNOSES  Final diagnoses:  Alcohol withdrawal syndrome, with delirium (HCC)  Seizure disorder (HCC)      NEW MEDICATIONS STARTED DURING THIS VISIT:  New Prescriptions   No medications on file     Note:  This document was prepared using Dragon voice recognition software and  may include unintentional dictation errors.    Dionne Bucy, MD 02/03/20 [phone]

## 2020-02-03 NOTE — ED Triage Notes (Signed)
Pt arrives via EMS after her boyfriend found her layin gin floor at home not responding- pt has 2 hematomas to the right side of the face from her fall- pt boyfriend states pt has a hx of seizures- per her father pt usually has seizures when she is withdrawing from alcohol or other substances- pt has a grand mal seizure with EMS and got 2 versed intranasal- BP elevated per EMS- pt alert but not answering questions

## 2020-02-04 LAB — PHOSPHORUS: Phosphorus: 4 mg/dL (ref 2.5–4.6)

## 2020-02-04 LAB — URINE CULTURE

## 2020-02-04 LAB — LACTIC ACID, PLASMA: Lactic Acid, Venous: 2.1 mmol/L (ref 0.5–1.9)

## 2020-02-04 LAB — COMPREHENSIVE METABOLIC PANEL
ALT: 247 U/L — ABNORMAL HIGH (ref 0–44)
AST: 624 U/L — ABNORMAL HIGH (ref 15–41)
Albumin: 3.3 g/dL — ABNORMAL LOW (ref 3.5–5.0)
Alkaline Phosphatase: 197 U/L — ABNORMAL HIGH (ref 38–126)
Anion gap: 14 (ref 5–15)
BUN: <5 mg/dL — ABNORMAL LOW (ref 6–20)
CO2: 29 mmol/L (ref 22–32)
Calcium: 8.4 mg/dL — ABNORMAL LOW (ref 8.9–10.3)
Chloride: 96 mmol/L — ABNORMAL LOW (ref 98–111)
Creatinine, Ser: 0.54 mg/dL (ref 0.44–1.00)
GFR calc Af Amer: >60 mL/min (ref >60)
GFR calc non Af Amer: >60 mL/min (ref >60)
Glucose, Bld: 225 mg/dL — ABNORMAL HIGH (ref 70–99)
Potassium: 2.7 mmol/L — CL (ref 3.5–5.1)
Sodium: 139 mmol/L (ref 135–145)
Total Bilirubin: 3.6 mg/dL — ABNORMAL HIGH (ref 0.3–1.2)
Total Protein: 7.2 g/dL (ref 6.5–8.1)

## 2020-02-04 LAB — CBC WITH DIFFERENTIAL/PLATELET
Abs Immature Granulocytes: 0.02 10*3/uL (ref 0.00–0.07)
Basophils Absolute: 0 10*3/uL (ref 0.0–0.1)
Basophils Relative: 0 %
Eosinophils Absolute: 0 10*3/uL (ref 0.0–0.5)
Eosinophils Relative: 1 %
HCT: 36 % (ref 36.0–46.0)
Hemoglobin: 13 g/dL (ref 12.0–15.0)
Immature Granulocytes: 0 %
Lymphocytes Relative: 29 %
Lymphs Abs: 1.3 10*3/uL (ref 0.7–4.0)
MCH: 33.2 pg (ref 26.0–34.0)
MCHC: 36.1 g/dL — ABNORMAL HIGH (ref 30.0–36.0)
MCV: 92.1 fL (ref 80.0–100.0)
Monocytes Absolute: 0.3 10*3/uL (ref 0.1–1.0)
Monocytes Relative: 6 %
Neutro Abs: 2.8 10*3/uL (ref 1.7–7.7)
Neutrophils Relative %: 64 %
Platelets: 28 10*3/uL — CL (ref 150–400)
RBC: 3.91 MIL/uL (ref 3.87–5.11)
RDW: 14.9 % (ref 11.5–15.5)
WBC: 4.5 10*3/uL (ref 4.0–10.5)
nRBC: 0.4 % — ABNORMAL HIGH (ref 0.0–0.2)

## 2020-02-04 LAB — POTASSIUM: Potassium: 3.8 mmol/L (ref 3.5–5.1)

## 2020-02-04 LAB — MAGNESIUM: Magnesium: 3.3 mg/dL — ABNORMAL HIGH (ref 1.7–2.4)

## 2020-02-04 LAB — GLUCOSE, CAPILLARY: Glucose-Capillary: 132 mg/dL — ABNORMAL HIGH (ref 70–99)

## 2020-02-04 LAB — PROCALCITONIN: Procalcitonin: 0.65 ng/mL

## 2020-02-04 MED ORDER — POTASSIUM CHLORIDE 10 MEQ/100ML IV SOLN
10.0000 meq | INTRAVENOUS | Status: AC
  Administered 2020-02-04 (×3): 10 meq via INTRAVENOUS
  Filled 2020-02-04 (×3): qty 100

## 2020-02-04 NOTE — Progress Notes (Signed)
eeg completed ° °

## 2020-02-04 NOTE — Progress Notes (Signed)
CRITICAL CARE NOTE 49 year old woman the as noted below including seizure disorder and alcohol abuse who presented via EMS after she was found on the floor at home unresponsive.   The patient is noted to have abrasions and laceration on the right cheek and a hematoma on the right side of her face.   She has ecchymoses under her left eye.  It was presumed she had a fall.  Patient's family was present when EMS presented to the home.  previous seizures from alcohol withdrawal previously Patient is no seizure medications.   ER COURSE  unresponsive she did become agitated and required Ativan up to 8 mg IV.   She was noted to have a fever and possible urinary tract infection and antibiotics have been started mainly vancomycin and Rocephin.   she has had E. coli UTI previously.   She had a similar presentation in January of this year.   She was admitted by our group in similar circumstance.  At that time LP was considered however, the patient's combativeness precluded doing this.    The patient at that time left AMA after she improved.   She is apparently not taking any medications at home.   Work-up in the emergency room has shown laboratory data as below.  CT scan of the head shows a possible new focus of indeterminate age infarct is low ganglia.    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  C-spine was cleared.  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  Urinalysis appears to be consistent with UTI.  Remainder of laboratory data is either pending or unremarkable.  Drug screen was positive for opiates alcohol level less than 10.  Patient received vancomycin x1 and Rocephin x1 in the ED.  She was loaded with Keppra.     SIGNIFICANT EVENTS 5/9 admitted for severe acute ETOH encephalopathy CT head c/w possible new infarct  5/10 remains critically ill and encephalopathic Remains on  precedex    CC  follow up seizures  SUBJECTIVE Patient remains critically ill Prognosis is guarded   BP (!) 140/93   Pulse (!) 109   Temp (!) 97.4 F (36.3 C) (Axillary)   Resp (!) 21   Ht 5\' 4"  (1.626 m)   Wt 56.3 kg   SpO2 98%   BMI 21.30 kg/m    I/O last 3 completed shifts: In: 3282.7 [I.V.:1277.2; IV Piggyback:2005.5] Out: 1350 [Urine:1350] No intake/output data recorded.  SpO2: 98 % O2 Flow Rate (L/min): 0 L/min FiO2 (%): 21 %  Estimated body mass index is 21.3 kg/m as calculated from the following:   Height as of this encounter: 5\' 4"  (1.626 m).   Weight as of this encounter: 56.3 kg.   REVIEW OF SYSTEMS  PATIENT IS UNABLE TO PROVIDE COMPLETE REVIEW OF SYSTEMS DUE TO SEVERE CRITICAL ILLNESS        PHYSICAL EXAMINATION:  GENERAL:critically ill appearing,  HEAD: Normocephalic, atraumatic.  EYES: Pupils equal, round, reactive to light.  No scleral icterus.  MOUTH: Moist mucosal membrane. NECK: Supple.  PULMONARY: +rhonchi,  CARDIOVASCULAR: S1 and S2. Regular rate and rhythm. No murmurs, rubs, or gallops.  GASTROINTESTINAL: Soft, nontender, -distended.  Positive bowel sounds.   MUSCULOSKELETAL: No swelling, clubbing, or edema.  NEUROLOGIC: obtunded SKIN:intact,warm,dry  MEDICATIONS: I have reviewed all medications and confirmed regimen as documented   CULTURE RESULTS   Recent Results (from the past 240 hour(s))  Respiratory Panel  by RT PCR (Flu A&B, Covid) - Nasopharyngeal Swab     Status: None   Collection Time: 02/03/20  3:34 PM   Specimen: Nasopharyngeal Swab  Result Value Ref Range Status   SARS Coronavirus 2 by RT PCR NEGATIVE NEGATIVE Final    Comment: (NOTE) SARS-CoV-2 target nucleic acids are NOT DETECTED. The SARS-CoV-2 RNA is generally detectable in upper respiratoy specimens during the acute phase of infection. The lowest concentration of SARS-CoV-2 viral copies this assay can detect is 131 copies/mL. A negative result does not  preclude SARS-Cov-2 infection and should not be used as the sole basis for treatment or other patient management decisions. A negative result may occur with  improper specimen collection/handling, submission of specimen other than nasopharyngeal swab, presence of viral mutation(s) within the areas targeted by this assay, and inadequate number of viral copies (<131 copies/mL). A negative result must be combined with clinical observations, patient history, and epidemiological information. The expected result is Negative. Fact Sheet for Patients:  https://www.moore.com/ Fact Sheet for Healthcare Providers:  https://www.young.biz/ This test is not yet ap proved or cleared by the [place] FDA and  has been authorized for detection and/or diagnosis of SARS-CoV-2 by FDA under an Emergency Use Authorization (EUA). This EUA will remain  in effect (meaning this test can be used) for the duration of the COVID-19 declaration under Section 564(b)(1) of the Act, 21 U.S.C. section 360bbb-3(b)(1), unless the authorization is terminated or revoked sooner.    Influenza A by PCR NEGATIVE NEGATIVE Final   Influenza B by PCR NEGATIVE NEGATIVE Final    Comment: (NOTE) The Xpert Xpress SARS-CoV-2/FLU/RSV assay is intended as an aid in  the diagnosis of influenza from Nasopharyngeal swab specimens and  should not be used as a sole basis for treatment. Nasal washings and  aspirates are unacceptable for Xpert Xpress SARS-CoV-2/FLU/RSV  testing. Fact Sheet for Patients: https://www.moore.com/ Fact Sheet for Healthcare Providers: https://www.young.biz/ This test is not yet approved or cleared by the [place] FDA and  has been authorized for detection and/or diagnosis of SARS-CoV-2 by  FDA under an Emergency Use Authorization (EUA). This EUA will remain  in effect (meaning this test can be used) for the duration of the   Covid-19 declaration under Section 564(b)(1) of the Act, 21  U.S.C. section 360bbb-3(b)(1), unless the authorization is  terminated or revoked. Performed at [facility], [address] [place], [place] [address]   MRSA PCR Screening     Status: Abnormal   Collection Time: 02/03/20  5:15 PM   Specimen: Nasopharyngeal  Result Value Ref Range Status   MRSA by PCR POSITIVE (A) NEGATIVE Final    Comment:        The GeneXpert MRSA Assay (FDA approved for NASAL specimens only), is one component of a comprehensive MRSA colonization surveillance program. It is not intended to diagnose MRSA infection nor to guide or monitor treatment for MRSA infections. RESULT CALLED TO, READ BACK BY AND VERIFIED WITH: SARA MOORE ON 02/03/2020 AT 1851 TIK Performed at [facility], [address] [place], [place] [address]           IMAGING    CT Head Wo Contrast  Result Date: 02/03/2020 CLINICAL DATA:  Seizure with head trauma. EXAM: CT HEAD WITHOUT CONTRAST TECHNIQUE: Contiguous axial images were obtained from the base of the skull through the vertex without intravenous contrast. COMPARISON:  October 04, 2019 FINDINGS: Brain: No evidence of acute hemorrhage, hydrocephalus, extra-axial collection or mass  lesion/mass effect. New focus of hypoattenuation in the left basal ganglia. 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 Stable moderate bilateral parietal lobe atrophy. Mild periventricular white matter microvascular disease. Vascular: No hyperdense vessel or unexpected calcification. Skull: Normal. Negative for fracture or focal lesion. Sinuses/Orbits: No acute finding. Other: None. IMPRESSION: 1. New focus of hypoattenuation in the left basal ganglia, which may represent an age-indeterminate infarct/asymmetric micro vascular disease. 2. 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 Smaller lacunar infarcts demonstrated by prior MRI are  not seen on today's exam. 3. Mild periventricular white matter microvascular disease and parenchymal volume loss. 4. No evidence of intracranial hemorrhage. Electronically Signed   By: Ted Mcalpine M.D.   On: 02/03/2020 14:29   CT Cervical Spine Wo Contrast  Result Date: 02/03/2020 CLINICAL DATA:  Found on the floor. Suspected seizure activity. EXAM: CT CERVICAL SPINE WITHOUT CONTRAST TECHNIQUE: Multidetector CT imaging of the cervical spine was performed without intravenous contrast. Multiplanar CT image reconstructions were also generated. COMPARISON:  Pain 12/15/2018 FINDINGS: Alignment: Straightening of the cervical lordosis. Skull base and vertebrae: No acute fracture. No primary bone lesion or focal pathologic process. Soft tissues and spinal canal: No prevertebral fluid or swelling. No visible canal hematoma. Disc levels:  Multilevel osteoarthritic changes. Upper chest: Negative. Other: None. IMPRESSION: 1. No evidence of acute traumatic injury to the cervical spine. 2. Multilevel osteoarthritic changes of the cervical spine. Electronically Signed   By: Ted Mcalpine M.D.   On: 02/03/2020 14:32   DG Chest Portable 1 View  Result Date: 02/03/2020 CLINICAL DATA:  Seizure. EXAM: PORTABLE CHEST 1 VIEW COMPARISON:  October 04, 2019 FINDINGS: The heart size and mediastinal contours are within normal limits. Both lungs are clear. The visualized skeletal structures are unremarkable. IMPRESSION: No active disease. Electronically Signed   By: Aram Candela M.D.   On: 02/03/2020 15:26   CT Maxillofacial Wo Contrast  Result Date: 02/03/2020 CLINICAL DATA:  Status post fall. Suspected seizure activity. EXAM: CT MAXILLOFACIAL WITHOUT CONTRAST TECHNIQUE: Multidetector CT imaging of the maxillofacial structures was performed. Multiplanar CT image reconstructions were also generated. COMPARISON:  Jan 28, 2019 FINDINGS: Osseous: No fracture or mandibular dislocation. No destructive process. Orbits:  Negative. No traumatic or inflammatory finding. Sinuses: Findings of chronic left maxillary sinusitis with bony remodeling and chronic mucosal thickening. Soft tissues: Mild right-sided facial bruising. Limited intracranial: No significant or unexpected finding. IMPRESSION: 1. No evidence of facial fractures. 2. Findings of chronic left maxillary sinusitis. Electronically Signed   By: Ted Mcalpine M.D.   On: 02/03/2020 14:37     Nutrition Status:      BMP Latest Ref Rng & Units 02/04/2020 02/03/2020 10/09/2019  Glucose 70 - 99 mg/dL 378(H) 885(O) 277(A)  BUN 6 - 20 mg/dL <1(O) 7 87(O)  Creatinine 0.44 - 1.00 mg/dL 6.76 7.20 9.47(S)  Sodium 135 - 145 mmol/L 139 135 136  Potassium 3.5 - 5.1 mmol/L 2.7(LL) 3.4(L) 3.5  Chloride 98 - 111 mmol/L 96(L) 95(L) 104  CO2 22 - 32 mmol/L 29 21(L) 22  Calcium 8.9 - 10.3 mg/dL 9.6(G) 8.1(L) 8.9          Indwelling Urinary Catheter continued, requirement due to   Reason to continue Indwelling Urinary Catheter strict Intake/Output monitoring for hemodynamic instability     ASSESSMENT AND PLAN SYNOPSIS  SEVERE ACUTE TOXIC METABOLIC ENCEPHALOPATHY ROM DT'S AND SEIZURES DUE TO ACUTE STROKES  SEVERE ALCOHOL WITHDRAWAL -Therapy with Thiamine and MVI -CIWA Protocol -Precedex as  needed -High risk for intubation  -high risk for aspiration   CARDIAC ICU monitoring  ID -continue IV abx as prescibed -follow up cultures  GI GI PROPHYLAXIS as indicated  NUTRITIONAL STATUS Nutrition Status:         DIET-->NPO Constipation protocol as indicated  ENDO - will use ICU hypoglycemic\Hyperglycemia protocol if indicated   ELECTROLYTES -follow labs as needed -replace as needed -pharmacy consultation and following   DVT/GI PRX ordered TRANSFUSIONS AS NEEDED MONITOR FSBS ASSESS the need for LABS as needed   Critical Care Time devoted to patient care services described in this note is 35 minutes.   Overall, patient is  critically ill, prognosis is guarded.     Corrin Parker, M.D.  [facility]  Medical Director [place] Director [facility] Cardio-Pulmonary Department

## 2020-02-04 NOTE — Procedures (Signed)
ELECTROENCEPHALOGRAM REPORT   Patient: Carla Dickson       Room #: IC12A-AA EEG No. ID: [id number] Age: 49 y.o.        Sex: female Requesting Physician: Jayme Cloud Report Date:  02/04/2020        Interpreting Physician: Thana Farr  History: Carla Dickson is an 49 y.o. female with altered mental status  Medications:  Precedex, Ceftriaxone, Keppra, Lorazepam, Thiiamine  Conditions of Recording:  This is a 21 channel routine scalp EEG performed with bipolar and monopolar montages arranged in accordance to the international 10/20 system of electrode placement. One channel was dedicated to EKG recording.  The patient is in the awake, drowsy and asleep states.  Description:  The patient appeared awake during the initial portion of the recording.  During this time artifact and beta activity dominate the tracing.  With drowse the background is noted to be slowed.  There is evidence of light sleep during the recording as well with symmetrical sleep spindles, vertex central sharp transients and irregular slow activity.   No epileptiform activity is noted.   Hyperventilation was not performed.  Intermittent photic stimulation was performed but failed to illicit any change in the tracing.    IMPRESSION: This findings on this electroencephalogram are consistent with the medications being administered.  No epileptiform activity is noted.     Thana Farr, MD Neurology [phone] 02/04/2020, 3:05 PM

## 2020-02-04 NOTE — Progress Notes (Signed)
Pharmacy Electrolyte Monitoring Consult:  Pharmacy consulted to assist in monitoring and replacing electrolytes in this 49 y.o. female admitted on 02/03/2020 with Seizures   Labs:  Sodium (mmol/L)  Date Value  02/03/2020 135  10/22/2014 139   Potassium (mmol/L)  Date Value  02/03/2020 3.4 (L)  10/22/2014 3.3 (L)   Magnesium (mg/dL)  Date Value  30/32/2019 1.0 (L)   Phosphorus (mg/dL)  Date Value  92/41/5516 1.1 (L)   Calcium (mg/dL)  Date Value  14/43/2469 8.1 (L)   Calcium, Total (mg/dL)  Date Value  97/80/2089 8.6   Albumin (g/dL)  Date Value  06/28/6284 3.6  10/22/2014 4.0    Assessment/Plan: Patient is currently received Kphos 20 mmol IV x 1 over 6 hours, mag 4g IV x 1 ~ patient will be getting 30 mEq of K+ from Kphos which will increase K+ to 3.7 mEq/L theoretically. Will check electrolytes w/ am labs and replace as needed.  Thomasene Ripple, PharmD, BCPS Clinical Pharmacist 02/04/2020 12:43 AM

## 2020-02-05 ENCOUNTER — Encounter: Payer: Self-pay | Admitting: Pulmonary Disease

## 2020-02-05 DIAGNOSIS — Z7189 Other specified counseling: Secondary | ICD-10-CM

## 2020-02-05 DIAGNOSIS — Z515 Encounter for palliative care: Secondary | ICD-10-CM

## 2020-02-05 DIAGNOSIS — G40909 Epilepsy, unspecified, not intractable, without status epilepticus: Secondary | ICD-10-CM

## 2020-02-05 LAB — COMPREHENSIVE METABOLIC PANEL
ALT: 176 U/L — ABNORMAL HIGH (ref 0–44)
AST: 273 U/L — ABNORMAL HIGH (ref 15–41)
Albumin: 3.1 g/dL — ABNORMAL LOW (ref 3.5–5.0)
Alkaline Phosphatase: 157 U/L — ABNORMAL HIGH (ref 38–126)
Anion gap: 12 (ref 5–15)
BUN: 10 mg/dL (ref 6–20)
CO2: 27 mmol/L (ref 22–32)
Calcium: 8.8 mg/dL — ABNORMAL LOW (ref 8.9–10.3)
Chloride: 101 mmol/L (ref 98–111)
Creatinine, Ser: 0.67 mg/dL (ref 0.44–1.00)
GFR calc Af Amer: >60 mL/min (ref >60)
GFR calc non Af Amer: >60 mL/min (ref >60)
Glucose, Bld: 83 mg/dL (ref 70–99)
Potassium: 2.8 mmol/L — ABNORMAL LOW (ref 3.5–5.1)
Sodium: 140 mmol/L (ref 135–145)
Total Bilirubin: 3.2 mg/dL — ABNORMAL HIGH (ref 0.3–1.2)
Total Protein: 6.7 g/dL (ref 6.5–8.1)

## 2020-02-05 LAB — CBC WITH DIFFERENTIAL/PLATELET
Abs Immature Granulocytes: 0.02 10*3/uL (ref 0.00–0.07)
Basophils Absolute: 0 10*3/uL (ref 0.0–0.1)
Basophils Relative: 1 %
Eosinophils Absolute: 0.1 10*3/uL (ref 0.0–0.5)
Eosinophils Relative: 2 %
HCT: 36.7 % (ref 36.0–46.0)
Hemoglobin: 12.7 g/dL (ref 12.0–15.0)
Immature Granulocytes: 0 %
Lymphocytes Relative: 29 %
Lymphs Abs: 1.4 10*3/uL (ref 0.7–4.0)
MCH: 33.1 pg (ref 26.0–34.0)
MCHC: 34.6 g/dL (ref 30.0–36.0)
MCV: 95.6 fL (ref 80.0–100.0)
Monocytes Absolute: 0.3 10*3/uL (ref 0.1–1.0)
Monocytes Relative: 6 %
Neutro Abs: 2.9 10*3/uL (ref 1.7–7.7)
Neutrophils Relative %: 62 %
Platelets: 27 10*3/uL — CL (ref 150–400)
RBC: 3.84 MIL/uL — ABNORMAL LOW (ref 3.87–5.11)
RDW: 15.4 % (ref 11.5–15.5)
WBC: 4.7 10*3/uL (ref 4.0–10.5)
nRBC: 0.6 % — ABNORMAL HIGH (ref 0.0–0.2)

## 2020-02-05 LAB — PHOSPHORUS: Phosphorus: 2.6 mg/dL (ref 2.5–4.6)

## 2020-02-05 LAB — HIV ANTIBODY (ROUTINE TESTING W REFLEX): HIV Screen 4th Generation wRfx: NONREACTIVE

## 2020-02-05 LAB — PROCALCITONIN: Procalcitonin: 0.73 ng/mL

## 2020-02-05 LAB — PROLACTIN: Prolactin: 5.2 ng/mL (ref 4.8–23.3)

## 2020-02-05 LAB — MAGNESIUM: Magnesium: 2 mg/dL (ref 1.7–2.4)

## 2020-02-05 MED ORDER — LORAZEPAM 2 MG/ML IJ SOLN
2.0000 mg | INTRAMUSCULAR | Status: DC | PRN
  Administered 2020-02-05 – 2020-02-13 (×11): 2 mg via INTRAVENOUS
  Filled 2020-02-05 (×12): qty 1

## 2020-02-05 MED ORDER — PROPOFOL 1000 MG/100ML IV EMUL
INTRAVENOUS | Status: AC
  Filled 2020-02-05: qty 100

## 2020-02-05 MED ORDER — POTASSIUM CHLORIDE 20 MEQ PO PACK
40.0000 meq | PACK | ORAL | Status: AC
  Administered 2020-02-05 (×2): 40 meq via ORAL
  Filled 2020-02-05 (×2): qty 2

## 2020-02-05 MED ORDER — KETOROLAC TROMETHAMINE 15 MG/ML IJ SOLN
15.0000 mg | Freq: Once | INTRAMUSCULAR | Status: AC
  Administered 2020-02-05: 15 mg via INTRAVENOUS
  Filled 2020-02-05: qty 1

## 2020-02-05 NOTE — Consult Note (Addendum)
Consultation Note Date: 02/05/2020   Patient Name: Carla Dickson  DOB: 1971-06-27  MRN: [id number]  Age / Sex: 49 y.o., female  PCP: Patient, No Pcp Per Referring Physician: Salena Saner, MD  Reason for Consultation: Establishing goals of care and Psychosocial/spiritual support  HPI/Patient Profile: 49 y.o. 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   Clinical Assessment and Goals of Care: Ms. Carla, Dickson, is lying quietly in bed.  She will make but not keep eye contact.  She is alert, oriented to self only, confused.  She is able to make her basic needs known.  There is no family at bedside at this time.  Carla Dickson is unable to tell me why she is hospitalized.  I ask if she is married, but she tells me that her husband, Carla Dickson, died 3 months ago.  Called to father, Carla Dickson.  [name] tells me that Carla Dickson's husband was 109 and he died 2023/01/18 this year, they lived in the same home.  He tells me that Carla Dickson lost her only child at aged 84.  We talk about health care surrogate and I ask if he would be able to make choices.  [name] tells me that he is willing to be surrogate decision maker.  [name] tells me that Carla Dickson has been through stuff since age 11, drugs and stuff.  I shared that it looks as though Carla Dickson has had a stroke, and also has "shrinking of the brain", her brain looks much older than 48, due to substance abuse.  [name] tells me he is not surprised by this.  We talk about code status.  [name] shares that he has never  discussed CODE STATUS with [name].  I shared that the medical team recommends treat the treatable but allowing natural passing.  I shared that we will talk more about this tomorrow.   Conference with CCM attending, bedside nursing staff related to patient condition, needs, goals of care.  PMT to follow-up with patient/family 5/12 around 10 AM.  HCPOA    NEXT OF KIN -father, Carla Dickson, only contact listed in chart.  Mother Carla Dickson, they were never married, is "on drugs" per [name].   SUMMARY OF RECOMMENDATIONS   At this point full scope/full code by default. Goals of care discussions when patient/family able. Anticipate discharge to home  unlikely that Carla Dickson would accept or qualify for rehab.   Code Status/Advance Care Planning:  Full code -   Symptom Management:   Per hospitalist/CCM, no additional needs at this time.  Palliative Prophylaxis:   Oral Care and Turn Reposition  Additional Recommendations (Limitations, Scope, Preferences):  Full Scope Treatment   Psycho-social/Spiritual:   Desire for further Chaplaincy support:no  Additional Recommendations: Caregiving  Support/Resources and Grief/Bereavement Support  Prognosis:   Unable to determine, based on outcomes.  Guarded at this point.  Discharge Planning: To be determined, based on outcomes.      Primary Diagnoses: Present on Admission: . Acute encephalopathy   I have reviewed the medical record, interviewed the patient and family, and examined the patient. The following aspects are pertinent.  Past Medical History:  Diagnosis Date  . Alcohol use   . Fracture of fifth metacarpal bone of left hand with routine healing   . Hypercholesteremia   . Hypertension   . Reflux   . Seizures ([name])   . Status post CVA 02/03/2020   Encephalomalacia from prior bilateral parietal infarcts   Social History   Socioeconomic History  . Marital status: Married    Spouse name: Not on file  . Number  of children: Not on file  . Years of education: Not on file  . Highest education level: Not on file  Occupational History  . Not on file  Tobacco Use  . Smoking status: Current Every Day Smoker    Packs/day: 0.50    Types: Cigarettes  . Smokeless tobacco: Never Used  Substance and Sexual Activity  . Alcohol use: Yes    Alcohol/week: 26.0 standard drinks    Types: 2 Cans of beer, 24 Shots of liquor per week    Comment: 2-3 drinks liquor per day  . Drug use: No  . Sexual activity: Not on file  Other Topics Concern  . Not on file  Social History Narrative  . Not on file   Social Determinants of Health   Financial Resource Strain:   . Difficulty of Paying Living Expenses:   Food Insecurity:   . Worried About Charity fundraiser in the Last Year:   . Arboriculturist in the Last Year:   Transportation Needs:   . Film/video editor (Medical):   Marland Kitchen Lack of Transportation (Non-Medical):   Physical Activity:   . Days of Exercise per Week:   . Minutes of Exercise per Session:   Stress:   . Feeling of Stress :   Social Connections:   . Frequency of Communication with Friends and Family:   . Frequency of Social Gatherings with Friends and Family:   . Attends Religious Services:   . Active Member of Clubs or Organizations:   . Attends Archivist Meetings:   Marland Kitchen Marital Status:    Family History  Problem Relation Age of Onset  . Breast cancer Maternal Grandmother    Scheduled Meds: . Chlorhexidine Gluconate Cloth  6 each Topical Daily  . Chlorhexidine Gluconate Cloth  6 each Topical Q0600  . folic acid  1 mg Intravenous Daily  . LORazepam  0-4 mg Intravenous Q6H   Or  . LORazepam  0-4 mg Oral Q6H  . LORazepam  0-4 mg Intravenous Q12H   Or  . LORazepam  0-4 mg Oral Q12H  . mupirocin ointment  1 application Nasal BID  . pantoprazole (PROTONIX) IV  40 mg Intravenous QHS  . potassium chloride  40 mEq Oral Q4H   Continuous Infusions: . cefTRIAXone (ROCEPHIN)  IV  2 g (02/04/20 1827)  . levETIRAcetam 500 mg (02/05/20 1205)  . thiamine injection Stopped (02/04/20 1738)   PRN Meds:.albuterol, docusate sodium, ondansetron (ZOFRAN) IV, polyethylene glycol Medications Prior to Admission:  Prior to Admission medications   Not on File   No Known Allergies Review of Systems  Unable to perform  ROS: Acuity of condition  All other systems reviewed and are negative.   Physical Exam Vitals and nursing note reviewed.  Constitutional:      General: She is not in acute distress.    Appearance: She is normal weight. She is ill-appearing.     Comments: Will make but not keep eye contact  Cardiovascular:     Rate and Rhythm: Normal rate.  Pulmonary:     Effort: Pulmonary effort is normal. No respiratory distress.  Skin:    General: Skin is warm and dry.     Comments: Bruising and abrasions to right face/cheek  Neurological:     Mental Status: She is alert.     Comments: Able to tell me her name, clearly confused  Psychiatric:     Comments: Calm and cooperative at this point, confused     Vital Signs: BP (!) 137/93   Pulse (!) 104   Temp 98.9 F (37.2 C) (Oral)   Resp (!) 23   Ht 5\' 4"  (1.626 m)   Wt 59.2 kg   SpO2 100%   BMI 22.40 kg/m  Pain Scale: 0-10   Pain Score: 0-No pain   SpO2: SpO2: 100 % O2 Device:SpO2: 100 % O2 Flow Rate: .O2 Flow Rate (L/min): 0 L/min  IO: Intake/output summary:   Intake/Output Summary (Last 24 hours) at 02/05/2020 1219 Last data filed at 02/05/2020 0900 Gross per 24 hour  Intake 534.28 ml  Output 300 ml  Net 234.28 ml    LBM: Last BM Date: (unknown) Baseline Weight: Weight: 68 kg Most recent weight: Weight: 59.2 kg     Palliative Assessment/Data:   Flowsheet Rows     Most Recent Value  Intake Tab  Referral Department  Hospitalist  Unit at Time of Referral  ICU  Palliative Care Primary Diagnosis  Neurology  Date Notified  02/05/20  Palliative Care Type  New Palliative care  Reason for  referral  Clarify Goals of Care  Date of Admission  02/03/20  Date first seen by Palliative Care  02/05/20  # of days Palliative referral response time  0 Day(s)  # of days IP prior to Palliative referral  2  Clinical Assessment  Palliative Performance Scale Score  20%  Pain Max last 24 hours  Not able to report  Pain Min Last 24 hours  Not able to report  Dyspnea Max Last 24 Hours  Not able to report  Dyspnea Min Last 24 hours  Not able to report  Psychosocial & Spiritual Assessment  Palliative Care Outcomes      Time In: 1320 Time Out: 1410 Time Total: 50 minutes  Greater than 50%  of this time was spent counseling and coordinating care related to the above assessment and plan.  Signed by: 04/06/20, NP   Please contact Palliative Medicine Team phone at [phone] for questions and concerns.  For individual provider: See [phone]

## 2020-02-05 NOTE — Progress Notes (Signed)
  PROGRESS NOTE    Carla Dickson  ZCK:[id number] DOB: [birth year] DOA: 02/03/2020  PCP: Patient, No Pcp Per    LOS - 2    Patient was admitted to PCCM service on 02/03/20 with acute encephalopathy and seizure secondary to severe alcohol withdrawal syndrome.  She required Precedex drip and has reportedly been off that for about 30 hours and doing well on CIWA with as needed Ativan.  She is also being treated for UTI with Rocephin.    [facility] service will assume care of patient tomorrow, 5/12.  No Charge    Pennie Banter, DO [facility]   If 7PM-7AM, please contact night-coverage [email or web address] 02/05/2020, 6:05 PM

## 2020-02-05 NOTE — Progress Notes (Signed)
PT Cancellation Note  Patient Details Name: Carla Dickson MRN: [id number] DOB: [birth year]   Cancelled Treatment:    Reason Eval/Treat Not Completed: Medical issues which prohibited therapy: Pt's Ka 2.8 with nursing stating pt remains somewhat confused and will likely follow commands more consistently tomorrow.  Will attempt to see pt at a future date/time as medically appropriate.     Ovidio Hanger PT, DPT 02/05/20, 3:01 PM

## 2020-02-05 NOTE — Progress Notes (Signed)
OT Cancellation Note  Patient Details Name: Carla Dickson MRN: [id number] DOB: [birth year]   Cancelled Treatment:    Reason Eval/Treat Not Completed: Medical issues which prohibited therapy  OT consult received and chart reviewed. Pt with slightly elevated HR, BP and RR at this time. In addition, Pt's platelets at 27. Will hold OT evaluation at this time and f/u as pt becomes more appropriate for activity participation. Thank you.  Rejeana Brock, MS, OTR/L ascom [phone] 02/05/20, 1:10 PM

## 2020-02-05 NOTE — Progress Notes (Signed)
Pharmacy Electrolyte Monitoring Consult:   Labs:  Sodium (mmol/L)  Date Value  02/05/2020 140  10/22/2014 139   Potassium (mmol/L)  Date Value  02/05/2020 2.8 (L)  10/22/2014 3.3 (L)   Magnesium (mg/dL)  Date Value  04/17/8287 2.0   Phosphorus (mg/dL)  Date Value  33/74/4514 2.6   Calcium (mg/dL)  Date Value  60/47/9987 8.8 (L)   Calcium, Total (mg/dL)  Date Value  21/58/7276 8.6   Albumin (g/dL)  Date Value  18/48/5927 3.1 (L)  10/22/2014 4.0    Assessment: 48y.o. female admitted to ICU on 02/03/2020 for seizures. Pharmacy consulted to assist in monitoring and replacing electrolytes  Goals of Therapy:  Electrolytes WNL, K ~4 and Mg ~2  Plan: Added potassium chloride 40 mEq PO q4h for 2 doses which will increase K+ to 3.6 mEq/L theoretically. All other electrolytes WNL.   Will continue to monitor w/ AM labs and replace as needed.  Delfin Gant, PharmD Clinical Pharmacist 02/05/2020 11:36 AM

## 2020-02-06 DIAGNOSIS — F10231 Alcohol dependence with withdrawal delirium: Principal | ICD-10-CM

## 2020-02-06 DIAGNOSIS — Z7189 Other specified counseling: Secondary | ICD-10-CM

## 2020-02-06 LAB — CBC WITH DIFFERENTIAL/PLATELET
Abs Immature Granulocytes: 0.03 10*3/uL (ref 0.00–0.07)
Basophils Absolute: 0 10*3/uL (ref 0.0–0.1)
Basophils Relative: 0 %
Eosinophils Absolute: 0.1 10*3/uL (ref 0.0–0.5)
Eosinophils Relative: 2 %
HCT: 31.3 % — ABNORMAL LOW (ref 36.0–46.0)
Hemoglobin: 11.4 g/dL — ABNORMAL LOW (ref 12.0–15.0)
Immature Granulocytes: 1 %
Lymphocytes Relative: 33 %
Lymphs Abs: 1.7 10*3/uL (ref 0.7–4.0)
MCH: 33.6 pg (ref 26.0–34.0)
MCHC: 36.4 g/dL — ABNORMAL HIGH (ref 30.0–36.0)
MCV: 92.3 fL (ref 80.0–100.0)
Monocytes Absolute: 0.5 10*3/uL (ref 0.1–1.0)
Monocytes Relative: 10 %
Neutro Abs: 2.9 10*3/uL (ref 1.7–7.7)
Neutrophils Relative %: 54 %
Platelets: 38 10*3/uL — ABNORMAL LOW (ref 150–400)
RBC: 3.39 MIL/uL — ABNORMAL LOW (ref 3.87–5.11)
RDW: 15.7 % — ABNORMAL HIGH (ref 11.5–15.5)
WBC: 5.3 10*3/uL (ref 4.0–10.5)
nRBC: 0.6 % — ABNORMAL HIGH (ref 0.0–0.2)

## 2020-02-06 LAB — COMPREHENSIVE METABOLIC PANEL
ALT: 164 U/L — ABNORMAL HIGH (ref 0–44)
AST: 378 U/L — ABNORMAL HIGH (ref 15–41)
Albumin: 3.1 g/dL — ABNORMAL LOW (ref 3.5–5.0)
Alkaline Phosphatase: 188 U/L — ABNORMAL HIGH (ref 38–126)
Anion gap: 8 (ref 5–15)
BUN: 10 mg/dL (ref 6–20)
CO2: 28 mmol/L (ref 22–32)
Calcium: 8.8 mg/dL — ABNORMAL LOW (ref 8.9–10.3)
Chloride: 99 mmol/L (ref 98–111)
Creatinine, Ser: 0.55 mg/dL (ref 0.44–1.00)
GFR calc Af Amer: >60 mL/min (ref >60)
GFR calc non Af Amer: >60 mL/min (ref >60)
Glucose, Bld: 119 mg/dL — ABNORMAL HIGH (ref 70–99)
Potassium: 3.4 mmol/L — ABNORMAL LOW (ref 3.5–5.1)
Sodium: 135 mmol/L (ref 135–145)
Total Bilirubin: 4.4 mg/dL — ABNORMAL HIGH (ref 0.3–1.2)
Total Protein: 6.9 g/dL (ref 6.5–8.1)

## 2020-02-06 LAB — PHOSPHORUS: Phosphorus: 1.7 mg/dL — ABNORMAL LOW (ref 2.5–4.6)

## 2020-02-06 LAB — MAGNESIUM: Magnesium: 1.6 mg/dL — ABNORMAL LOW (ref 1.7–2.4)

## 2020-02-06 MED ORDER — MAGNESIUM SULFATE 2 GM/50ML IV SOLN
2.0000 g | Freq: Once | INTRAVENOUS | Status: AC
  Administered 2020-02-06: 2 g via INTRAVENOUS
  Filled 2020-02-06: qty 50

## 2020-02-06 MED ORDER — ATORVASTATIN CALCIUM 20 MG PO TABS
40.0000 mg | ORAL_TABLET | Freq: Every day | ORAL | Status: DC
  Administered 2020-02-10 – 2020-02-15 (×6): 40 mg via ORAL
  Filled 2020-02-06 (×6): qty 2

## 2020-02-06 MED ORDER — ASPIRIN EC 81 MG PO TBEC
81.0000 mg | DELAYED_RELEASE_TABLET | Freq: Every day | ORAL | Status: DC
  Administered 2020-02-10 – 2020-02-15 (×6): 81 mg via ORAL
  Filled 2020-02-06 (×6): qty 1

## 2020-02-06 MED ORDER — SODIUM CHLORIDE 0.9% FLUSH: 10.0000 mL | INTRAVENOUS | Status: DC | PRN

## 2020-02-06 MED ORDER — POTASSIUM PHOSPHATES 15 MMOLE/5ML IV SOLN
20.0000 mmol | Freq: Once | INTRAVENOUS | Status: AC
  Administered 2020-02-06: 12:00:00 20 mmol via INTRAVENOUS
  Filled 2020-02-06: qty 6.67

## 2020-02-06 MED ORDER — HALOPERIDOL LACTATE 5 MG/ML IJ SOLN: 2.0000 mg | Freq: Once | INTRAMUSCULAR | Status: DC

## 2020-02-06 MED ORDER — DEXMEDETOMIDINE HCL IN NACL 400 MCG/100ML IV SOLN
0.4000 ug/kg/h | INTRAVENOUS | Status: DC
  Administered 2020-02-06: 21:00:00 0.4 ug/kg/h via INTRAVENOUS
  Administered 2020-02-07: 0.5 ug/kg/h via INTRAVENOUS
  Administered 2020-02-07 – 2020-02-08 (×3): 0.8 ug/kg/h via INTRAVENOUS
  Administered 2020-02-08 – 2020-02-09 (×5): 1.2 ug/kg/h via INTRAVENOUS
  Administered 2020-02-09: 0.6 ug/kg/h via INTRAVENOUS
  Administered 2020-02-10: 0.8 ug/kg/h via INTRAVENOUS
  Administered 2020-02-10: 0.4 ug/kg/h via INTRAVENOUS
  Administered 2020-02-10 – 2020-02-11 (×2): 0.6 ug/kg/h via INTRAVENOUS
  Filled 2020-02-06 (×16): qty 100

## 2020-02-06 MED ORDER — DIPHENHYDRAMINE HCL 50 MG/ML IJ SOLN: 25.0000 mg | Freq: Once | INTRAMUSCULAR | Status: DC

## 2020-02-06 MED ORDER — ZIPRASIDONE MESYLATE 20 MG IM SOLR
10.0000 mg | Freq: Once | INTRAMUSCULAR | Status: AC
  Administered 2020-02-06: 02:00:00 10 mg via INTRAMUSCULAR
  Filled 2020-02-06: qty 20

## 2020-02-06 NOTE — Progress Notes (Signed)
Patient had slept all day until about 1630. Pt did not want to take bath.

## 2020-02-06 NOTE — Progress Notes (Signed)
OT Cancellation Note  Patient Details Name: Carla Dickson MRN: [id number] DOB: November 28, 1970   Cancelled Treatment:    Reason Eval/Treat Not Completed: Other (comment)  Upon chart review this AM, it is noted that the palliative medicine team is to follow up with patient/family this date "around 10AM" to further discuss goals of care. Will f/u for OT evaluation, if indicated/appropriate, once goals of care better established. Thank you.  Rejeana Brock, MS, OTR/L ascom [phone] 02/06/20, 10:22 AM

## 2020-02-06 NOTE — Progress Notes (Signed)
CRITICAL CARE NOTE 49 year old woman the as noted below including seizure disorder and alcohol abuse who presented via EMS after she was found on the floor at home unresponsive.   The patient is noted to have abrasions and laceration on the right cheek and a hematoma on the right side of her face.   She has ecchymoses under her left eye.  It was presumed she had a fall.  Patient's family was present when EMS presented to the home.  previous seizures from alcohol withdrawal previously Patient is no seizure medications.   ER COURSE  unresponsive she did become agitated and required Ativan up to 8 mg IV.   She was noted to have a fever and possible urinary tract infection and antibiotics have been started mainly vancomycin and Rocephin.   she has had E. coli UTI previously.   She had a similar presentation in January of this year.   She was admitted by our group in similar circumstance.  At that time LP was considered however, the patient's combativeness precluded doing this.    The patient at that time left AMA after she improved.   She is apparently not taking any medications at home.   Work-up in the emergency room has shown laboratory data as below.  CT scan of the head shows a possible new focus of indeterminate age infarct is low ganglia.    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  C-spine was cleared.  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  Urinalysis appears to be consistent with UTI.  Remainder of laboratory data is either pending or unremarkable.  Drug screen was positive for opiates alcohol level less than 10.  Patient received vancomycin x1 and Rocephin x1 in the ED.  She was loaded with Keppra.     SIGNIFICANT EVENTS 5/9 admitted for severe acute ETOH encephalopathy CT head c/w possible new infarct 5/10-5/13 on precedex 5/13 patient now DNR/DNI  CC  Follow up  seziures  HPI On precedex Now DNR/DNI remains delirious Critically ill  Patient remains critically ill Prognosis is guarded High risk for aspiration    BP (!) 147/99   Pulse 77   Temp 97.6 F (36.4 C)   Resp 13   Ht 5\' 4"  (1.626 m)   Wt 59.2 kg   SpO2 100%   BMI 22.40 kg/m    I/O last 3 completed shifts: In: 737.4 [P.O.:240; I.V.:64.3; IV Piggyback:433.2] Out: 410 [Urine:410] Total I/O In: 698.1 [I.V.:224.5; IV Piggyback:473.6] Out: -   SpO2: 100 % O2 Flow Rate (L/min): 0 L/min FiO2 (%): 21 %  Estimated body mass index is 22.4 kg/m as calculated from the following:   Height as of this encounter: 5\' 4"  (1.626 m).   Weight as of this encounter: 59.2 kg.  REVIEW OF SYSTEMS  PATIENT IS UNABLE TO PROVIDE COMPLETE REVIEW OF SYSTEM S DUE TO SEVERE CRITICAL ILLNESS AND ENCEPHALOPATHY   PHYSICAL EXAMINATION:  GENERAL:critically ill appearing,  HEAD: Normocephalic, atraumatic.  EYES: Pupils equal, round, reactive to light.  No scleral icterus.  MOUTH: Moist mucosal membrane. NECK: Supple. No thyromegaly. No nodules. No JVD.  PULMONARY: +rhonchi,  CARDIOVASCULAR: S1 and S2. Regular rate and rhythm. No murmurs, rubs, or gallops.  GASTROINTESTINAL: Soft, nontender, -distended. Positive bowel sounds.  MUSCULOSKELETAL: No swelling, clubbing, or edema.  NEUROLOGIC: obtunded SKIN:intact,warm,dry    MEDICATIONS: I have reviewed all medications and confirmed regimen as documented  CULTURE RESULTS   Recent Results (from the past 240 hour(s))  Urine culture     Status: Abnormal   Collection Time: 02/03/20  2:29 PM   Specimen: Urine, Catheterized  Result Value Ref Range Status   Specimen Description   Final    URINE, CATHETERIZED Performed at [facility], [address] [place], [place] [address]    Special Requests   Final    NONE Performed at [facility], [address] [place], [place] [address]    Culture MULTIPLE SPECIES PRESENT,  SUGGEST RECOLLECTION (A)  Final   Report Status 02/04/2020 FINAL  Final  Culture, blood (routine x 2)     Status: None (Preliminary result)   Collection Time: 02/03/20  3:09 PM   Specimen: BLOOD  Result Value Ref Range Status   Specimen Description BLOOD R HAND  Final   Special Requests   Final    BOTTLES DRAWN AEROBIC AND ANAEROBIC Blood Culture results may not be optimal due to an inadequate volume of blood received in culture bottles   Culture   Final    NO GROWTH 3 DAYS Performed at [facility], [address], [place], [place] [address]    Report Status PENDING  Incomplete  Culture, blood (routine x 2)     Status: None (Preliminary result)   Collection Time: 02/03/20  3:09 PM   Specimen: BLOOD  Result Value Ref Range Status   Specimen Description BLOOD L H  Final   Special Requests   Final    BOTTLES DRAWN AEROBIC AND ANAEROBIC Blood Culture adequate volume   Culture   Final    NO GROWTH 3 DAYS Performed at [facility], [address], [place], [place] [address]    Report Status PENDING  Incomplete  Respiratory Panel by RT PCR (Flu A&B, Covid) - Nasopharyngeal Swab     Status: None   Collection Time: 02/03/20  3:34 PM   Specimen: Nasopharyngeal Swab  Result Value Ref Range Status   SARS Coronavirus 2 by RT PCR NEGATIVE NEGATIVE Final    Comment: (NOTE) SARS-CoV-2 target nucleic acids are NOT DETECTED. The SARS-CoV-2 RNA is generally detectable in upper respiratoy specimens during the acute phase of infection. The lowest concentration of SARS-CoV-2 viral copies this assay can detect is 131 copies/mL. A negative result does not preclude SARS-Cov-2 infection and should not be used as the sole basis for treatment or other patient management decisions. A negative result may occur with  improper specimen collection/handling, submission of specimen other than nasopharyngeal swab, presence of viral mutation(s) within the areas targeted by this assay, and  inadequate number of viral copies (<131 copies/mL). A negative result must be combined with clinical observations, patient history, and epidemiological information. The expected result is Negative. Fact Sheet for Patients:  https://www.moore.com/ Fact Sheet for Healthcare Providers:  https://www.young.biz/ This test is not yet ap proved or cleared by the [place] FDA and  has been authorized for detection and/or diagnosis of SARS-CoV-2 by FDA under an Emergency Use Authorization (EUA). This EUA will remain  in effect (meaning this test can be used) for the duration of the COVID-19 declaration under Section 564(b)(1) of the Act, 21 U.S.C. section 360bbb-3(b)(1), unless the authorization is terminated or revoked sooner.    Influenza A by PCR NEGATIVE NEGATIVE Final   Influenza B by PCR NEGATIVE NEGATIVE Final    Comment: (NOTE) The Xpert Xpress SARS-CoV-2/FLU/RSV assay is intended as an aid in  the diagnosis  of influenza from Nasopharyngeal swab specimens and  should not be used as a sole basis for treatment. Nasal washings and  aspirates are unacceptable for Xpert Xpress SARS-CoV-2/FLU/RSV  testing. Fact Sheet for Patients: https://www.moore.com/ Fact Sheet for Healthcare Providers: https://www.young.biz/ This test is not yet approved or cleared by the [place] FDA and  has been authorized for detection and/or diagnosis of SARS-CoV-2 by  FDA under an Emergency Use Authorization (EUA). This EUA will remain  in effect (meaning this test can be used) for the duration of the  Covid-19 declaration under Section 564(b)(1) of the Act, 21  U.S.C. section 360bbb-3(b)(1), unless the authorization is  terminated or revoked. Performed at [facility], [address] [place], [place] [address]   MRSA PCR Screening     Status: Abnormal   Collection Time: 02/03/20  5:15 PM   Specimen:  Nasopharyngeal  Result Value Ref Range Status   MRSA by PCR POSITIVE (A) NEGATIVE Final    Comment:        The GeneXpert MRSA Assay (FDA approved for NASAL specimens only), is one component of a comprehensive MRSA colonization surveillance program. It is not intended to diagnose MRSA infection nor to guide or monitor treatment for MRSA infections. RESULT CALLED TO, READ BACK BY AND VERIFIED WITH: SARA MOORE ON 02/03/2020 AT 1851 TIK Performed at [facility], [address] [place], [place] [address]           IMAGING    No results found.   Nutrition Status:      BMP Latest Ref Rng & Units 02/06/2020 02/05/2020 02/04/2020  Glucose 70 - 99 mg/dL 191(Y) 83 -  BUN 6 - 20 mg/dL 10 10 -  Creatinine 7.82 - 1.00 mg/dL 9.56 2.13 -  Sodium 086 - 145 mmol/L 135 140 -  Potassium 3.5 - 5.1 mmol/L 3.4(L) 2.8(L) 3.8  Chloride 98 - 111 mmol/L 99 101 -  CO2 22 - 32 mmol/L 28 27 -  Calcium 8.9 - 10.3 mg/dL 5.7(Q) 4.6(N) -          Indwelling Urinary Catheter continued, requirement due to   Reason to continue Indwelling Urinary Catheter strict Intake/Output monitoring for hemodynamic instability     ASSESSMENT AND PLAN SYNOPSIS  SEVERE ACUTE TOXIC METABOLIC ENCEPHALOPATHY ROM DT'S AND SEIZURES DUE TO ACUTE STROKES  SEVERE ALCOHOL WITHDRAWAL -Therapy with Thiamine and MVI -CIWA Protocol -Precedex as needed -high risk for aspiration   CARDIAC ICU monitoring   INFECTIOUS DISEASE -continue antibiotics as prescribed -follow up cultures  NPO  ELECTROLYTES -follow labs as needed -replace as needed -pharmacy consultation and following    DVT/GI PRX ordered TRANSFUSIONS AS NEEDED MONITOR FSBS ASSESS the need for LABS as needed    Critical Care Time devoted to patient care services described in this note is 32 minutes.   Overall, patient is critically ill, prognosis is guarded.  Patient with Multiorgan failure and at high risk for cardiac arrest  and death.   PATIENT NOW DNR/DNI  Lucie Leather, M.D.  [facility]  Medical Director [facility] Medical Director [facility] Cardio-Pulmonary Department

## 2020-02-06 NOTE — Progress Notes (Signed)
Pharmacy Electrolyte Monitoring Consult:   Labs:  Sodium (mmol/L)  Date Value  02/06/2020 135  10/22/2014 139   Potassium (mmol/L)  Date Value  02/06/2020 3.4 (L)  10/22/2014 3.3 (L)   Magnesium (mg/dL)  Date Value  44/58/4835 1.6 (L)   Phosphorus (mg/dL)  Date Value  07/57/3225 1.7 (L)   Calcium (mg/dL)  Date Value  67/20/9198 8.8 (L)   Calcium, Total (mg/dL)  Date Value  11/18/7979 8.6   Albumin (g/dL)  Date Value  02/54/8628 3.1 (L)  10/22/2014 4.0    Assessment: 48y.o. female admitted to ICU on 02/03/2020 for seizures. Pharmacy consulted to assist in monitoring and replacing electrolytes  Goals of Therapy:  Electrolytes WNL, K ~4, Mg ~2 and phos ~3  Plan: Added Kphos in D5 IV and magnesium 2g IV x 1 dose. All other electrolytes WNL.   Will continue to monitor w/ AM labs and replace as needed.  Delfin Gant, PharmD Clinical Pharmacist 02/06/2020 1:38 PM

## 2020-02-06 NOTE — Progress Notes (Addendum)
Patient intermittently restless and agitated. Following CIWA and has prn ativan ordered. PIV became infiltrated, IV consult placed. While waiting for IV, one time dose of geodon ordered and give IM for agitation. Midline placed by IV team, prn ativan given. With continuing agitation and patient continuously removing wires, precedex gtt started. After initiation of precedex, patient has been sleeping.

## 2020-02-06 NOTE — Progress Notes (Signed)
Patient ID: Carla Dickson, female   DOB: 1970-11-01, 49 y.o.   MRN: [id number]  pt is back on precedex gtt. She will reamin in the ICU till she is off it. Dr Belia Heman with be the attending--discussed with him. Thank you

## 2020-02-06 NOTE — Progress Notes (Signed)
PT Cancellation Note  Patient Details Name: Carla Dickson MRN: [id number] DOB: 1970/11/28   Cancelled Treatment:    Reason Eval/Treat Not Completed: Patient's level of consciousness. Patient's nsg reports that she is not due to agitation and now sleeping from medications .    Ezekiel Ina, [place] DPT 02/06/2020, 11:46 AM

## 2020-02-06 NOTE — Progress Notes (Addendum)
Palliative: Carla Dickson, [place], is resting quietly in bed.  She experienced agitation overnight and Precedex drip was restarted. I do not believe that she can make her basic needs known and there is no family at bedside at this time.    Call to father, Carla Dickson.  We talked about Carla Dickson's acute and chronic health problems.  He tells me that he has talked with nursing staff who share that Internet is sleeping.  I share that Carla Dickson had some agitation overnight and is now on a medication to help keep her calm.  I share that when people have had strokes, seizures, alcohol withdrawal, we need to make sure that they are calm.  I share that although the goal is to keep her calm, if she is sleeping, she is not eating.  We talked about finding balance in caring for Carla Dickson during this difficult time.    We talked about how to notes stroke, seizure disorder, alcohol withdrawal.  Carla Dickson and I talked about selected labs.  I share that she currently is showing no signs of infection, but she is at risk for pneumonia.  We talked about time for outcomes.  Call asks, "she is still sick right now?"  I share,  "yes, very sick".  We talked about her body, modern medicine, and God's will.  We talked about CODE STATUS.  Carla Dickson states that he has talked with his family about how sick) that is at this point.  He states that he and family would like for [place] to continue to treat the treatable, but that he would not want life support.  I shared that we can adjust CODE STATUS if Carla Dickson is able to improve.  Orders adjusted.    PMT to continue to follow.   Conference with attending, bedside nursing staff, TOC related to patient condition, needs, CODE STATUS discussions and GOC  Plan:   Time for outcomes.  Treat the treatable but no CPR or intubation/DNR status.      35 minutes Carla Carmel, NP Palliative Medicine Team Team Phone # [phone] Greater than 50% of this time was spent counseling and coordinating  care related to the above assessment and plan.

## 2020-02-07 LAB — CBC WITH DIFFERENTIAL/PLATELET
Abs Immature Granulocytes: 0.02 10*3/uL (ref 0.00–0.07)
Basophils Absolute: 0 10*3/uL (ref 0.0–0.1)
Basophils Relative: 0 %
Eosinophils Absolute: 0.1 10*3/uL (ref 0.0–0.5)
Eosinophils Relative: 3 %
HCT: 34.6 % — ABNORMAL LOW (ref 36.0–46.0)
Hemoglobin: 12 g/dL (ref 12.0–15.0)
Immature Granulocytes: 1 %
Lymphocytes Relative: 31 %
Lymphs Abs: 1.3 10*3/uL (ref 0.7–4.0)
MCH: 33.2 pg (ref 26.0–34.0)
MCHC: 34.7 g/dL (ref 30.0–36.0)
MCV: 95.8 fL (ref 80.0–100.0)
Monocytes Absolute: 0.4 10*3/uL (ref 0.1–1.0)
Monocytes Relative: 9 %
Neutro Abs: 2.4 10*3/uL (ref 1.7–7.7)
Neutrophils Relative %: 56 %
Platelets: 50 10*3/uL — ABNORMAL LOW (ref 150–400)
RBC: 3.61 MIL/uL — ABNORMAL LOW (ref 3.87–5.11)
RDW: 15.6 % — ABNORMAL HIGH (ref 11.5–15.5)
WBC: 4.3 10*3/uL (ref 4.0–10.5)
nRBC: 0.5 % — ABNORMAL HIGH (ref 0.0–0.2)

## 2020-02-07 LAB — BASIC METABOLIC PANEL
Anion gap: 7 (ref 5–15)
BUN: 9 mg/dL (ref 6–20)
CO2: 30 mmol/L (ref 22–32)
Calcium: 9.1 mg/dL (ref 8.9–10.3)
Chloride: 104 mmol/L (ref 98–111)
Creatinine, Ser: 0.6 mg/dL (ref 0.44–1.00)
GFR calc Af Amer: >60 mL/min (ref >60)
GFR calc non Af Amer: >60 mL/min (ref >60)
Glucose, Bld: 141 mg/dL — ABNORMAL HIGH (ref 70–99)
Potassium: 3.5 mmol/L (ref 3.5–5.1)
Sodium: 141 mmol/L (ref 135–145)

## 2020-02-07 LAB — MAGNESIUM: Magnesium: 1.7 mg/dL (ref 1.7–2.4)

## 2020-02-07 LAB — PHOSPHORUS: Phosphorus: 5.1 mg/dL — ABNORMAL HIGH (ref 2.5–4.6)

## 2020-02-07 MED ORDER — ENSURE ENLIVE PO LIQD
237.0000 mL | Freq: Three times a day (TID) | ORAL | Status: DC
  Administered 2020-02-09 – 2020-02-15 (×16): 237 mL via ORAL

## 2020-02-07 MED ORDER — MORPHINE SULFATE (PF) 2 MG/ML IV SOLN
1.0000 mg | INTRAVENOUS | Status: DC | PRN
  Administered 2020-02-07 – 2020-02-11 (×10): 1 mg via INTRAVENOUS
  Filled 2020-02-07 (×10): qty 1

## 2020-02-07 NOTE — Progress Notes (Signed)
Patient has had episodes of severe agitation, hallucinations, mixed with periods of lethargy this shift.  Patient has had slurred speech for most of the day.  Oriented to person only.  Kept patient NPO this day due to lethargy and confusion.  Seizure pads and bed alarm intact.  Patient remains on precedex.  Precedex needed to be increased throughout the shift.  PRN ativan given as ordered.  PRN morphine given for pain and no change was noticed in patient.  Patient denies pain when asked. Safety maintained.  Will continue to monitor.

## 2020-02-07 NOTE — Progress Notes (Signed)
PT Cancellation Note  Patient Details Name: Carla Dickson MRN: [id number] DOB: Feb 09, 1971   Cancelled Treatment:    Reason Eval/Treat Not Completed: Medical issues which prohibited therapy(Chart reviewed for re-attempt at patient evaluation.  Remains generally lethargic/sedated due to precedex infusion; unable to participate in formal PT evaluation.  Will re-attempt at later time/date as medically appropriate and available.)   Tomoko Sandra H. Manson Passey, PT, DPT, NCS 02/07/20, 9:48 AM [phone]

## 2020-02-07 NOTE — Progress Notes (Signed)
CRITICAL CARE NOTE 49 year old womanthe as noted below including seizure disorder and alcohol abuse who presented via EMS after she was found on the floor at home unresponsive.  The patient is noted to have abrasions and laceration on the right cheek and a hematoma on the right side of her face.  She has ecchymoses under her left eye. It was presumed she had a fall. Patient's family was present when EMS presented to the home.  previous seizures from alcohol withdrawal previously Patient is no seizure medications.   ER COURSE  unresponsive she did become agitated and required Ativan up to 8 mg IV.  She was noted to have a fever and possible urinary tract infection and antibiotics have been started mainly vancomycin and Rocephin.  she has had E. coli UTI previously.  She had a similar presentation in January of this year.  She was admitted by our group in similar circumstance. At that time LP was considered however,the patient's combativeness precluded doing this.   The patient at that time left AMA after she improved.  She is apparently not taking any medications at home.  Work-up in the emergency room has shown laboratory data as below. CT scan of the head shows a possible new focus of indeterminate age infarct is low ganglia.   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 C-spine was cleared. 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 Urinalysis appears to be consistent with UTI. Remainder of laboratory data is either pending or unremarkable. Drug screen was positive for opiates alcohol level less than 10. Patient received vancomycin x1 and Rocephin x1 in the ED. She was loaded with Keppra.     SIGNIFICANT EVENTS 5/9 admitted for severe acute ETOH encephalopathy CT head c/w possible new infarct 5/10-5/13 on precedex 5/13 patient now DNR/DNI 5/14 severe  agitation   CC  follow up seziure  HPI Patient remains critically ill Prognosis is guarded High risk for aspiration DNR/DNI   BP (!) 157/91   Pulse 63   Temp 97.8 F (36.6 C) (Axillary)   Resp 11   Ht 5\' 4"  (1.626 m)   Wt 59.2 kg   SpO2 100%   BMI 22.40 kg/m    I/O last 3 completed shifts: In: 1302.8 [I.V.:224.5; IV Piggyback:1078.3] Out: 450 [Urine:450] Total I/O In: 140.8 [I.V.:140.8] Out: 650 [Urine:650]  SpO2: 100 % O2 Flow Rate (L/min): 0 L/min FiO2 (%): 21 %  Estimated body mass index is 22.4 kg/m as calculated from the following:   Height as of this encounter: 5\' 4"  (1.626 m).   Weight as of this encounter: 59.2 kg.  SIGNIFICANT EVENTS   REVIEW OF SYSTEMS  PATIENT IS UNABLE TO PROVIDE COMPLETE REVIEW OF SYSTEMS DUE TO SEVERE ENCEPHALOPATHY       PHYSICAL EXAMINATION:  GENERAL:critically ill appearing,  HEAD: Normocephalic, atraumatic.  EYES: Pupils equal, round, reactive to light.  No scleral icterus.  MOUTH: Moist mucosal membrane. NECK: Supple.  PULMONARY: +rhonchi, +wheezing CARDIOVASCULAR: S1 and S2. Regular rate and rhythm. No murmurs, rubs, or gallops.  GASTROINTESTINAL: Soft, nontender, -distended.  Positive bowel sounds.   MUSCULOSKELETAL: No swelling, clubbing, or edema.  NEUROLOGIC: agitated and delerious SKIN:intact,warm,dry  MEDICATIONS: I have reviewed all medications and confirmed regimen as documented   CULTURE RESULTS   Recent Results (from the past 240 hour(s))  Urine culture     Status: Abnormal   Collection Time: 02/03/20  2:29 PM  Specimen: Urine, Catheterized  Result Value Ref Range Status   Specimen Description   Final    URINE, CATHETERIZED Performed at [facility], [address] [place], [place] [address]    Special Requests   Final    NONE Performed at [facility], [address] [place], [place] [address]    Culture MULTIPLE SPECIES PRESENT, SUGGEST RECOLLECTION (A)  Final    Report Status 02/04/2020 FINAL  Final  Culture, blood (routine x 2)     Status: None (Preliminary result)   Collection Time: 02/03/20  3:09 PM   Specimen: BLOOD  Result Value Ref Range Status   Specimen Description BLOOD R HAND  Final   Special Requests   Final    BOTTLES DRAWN AEROBIC AND ANAEROBIC Blood Culture results may not be optimal due to an inadequate volume of blood received in culture bottles   Culture   Final    NO GROWTH 4 DAYS Performed at [facility], [address], [place], [place] [address]    Report Status PENDING  Incomplete  Culture, blood (routine x 2)     Status: None (Preliminary result)   Collection Time: 02/03/20  3:09 PM   Specimen: BLOOD  Result Value Ref Range Status   Specimen Description BLOOD L H  Final   Special Requests   Final    BOTTLES DRAWN AEROBIC AND ANAEROBIC Blood Culture adequate volume   Culture   Final    NO GROWTH 4 DAYS Performed at [facility], [address], [place], [place] [address]    Report Status PENDING  Incomplete  Respiratory Panel by RT PCR (Flu A&B, Covid) - Nasopharyngeal Swab     Status: None   Collection Time: 02/03/20  3:34 PM   Specimen: Nasopharyngeal Swab  Result Value Ref Range Status   SARS Coronavirus 2 by RT PCR NEGATIVE NEGATIVE Final    Comment: (NOTE) SARS-CoV-2 target nucleic acids are NOT DETECTED. The SARS-CoV-2 RNA is generally detectable in upper respiratoy specimens during the acute phase of infection. The lowest concentration of SARS-CoV-2 viral copies this assay can detect is 131 copies/mL. A negative result does not preclude SARS-Cov-2 infection and should not be used as the sole basis for treatment or other patient management decisions. A negative result may occur with  improper specimen collection/handling, submission of specimen other than nasopharyngeal swab, presence of viral mutation(s) within the areas targeted by this assay, and inadequate number of viral  copies (<131 copies/mL). A negative result must be combined with clinical observations, patient history, and epidemiological information. The expected result is Negative. Fact Sheet for Patients:  https://www.moore.com/ Fact Sheet for Healthcare Providers:  https://www.young.biz/ This test is not yet ap proved or cleared by the [place] FDA and  has been authorized for detection and/or diagnosis of SARS-CoV-2 by FDA under an Emergency Use Authorization (EUA). This EUA will remain  in effect (meaning this test can be used) for the duration of the COVID-19 declaration under Section 564(b)(1) of the Act, 21 U.S.C. section 360bbb-3(b)(1), unless the authorization is terminated or revoked sooner.    Influenza A by PCR NEGATIVE NEGATIVE Final   Influenza B by PCR NEGATIVE NEGATIVE Final    Comment: (NOTE) The Xpert Xpress SARS-CoV-2/FLU/RSV assay is intended as an aid in  the diagnosis of influenza from Nasopharyngeal swab specimens and  should not be used as a sole basis for treatment. Nasal washings and  aspirates are unacceptable for Xpert Xpress SARS-CoV-2/FLU/RSV  testing. Fact Sheet for Patients: https://www.moore.com/ Fact Sheet for Healthcare Providers: https://www.young.biz/ This test is not yet approved or cleared by the [place] FDA and  has been authorized for detection and/or diagnosis of SARS-CoV-2 by  FDA under an Emergency Use Authorization (EUA). This EUA will remain  in effect (meaning this test can be used) for the duration of the  Covid-19 declaration under Section 564(b)(1) of the Act, 21  U.S.C. section 360bbb-3(b)(1), unless the authorization is  terminated or revoked. Performed at [facility], [address] [place], [place] [address]   MRSA PCR Screening     Status: Abnormal   Collection Time: 02/03/20  5:15 PM   Specimen: Nasopharyngeal  Result Value Ref Range  Status   MRSA by PCR POSITIVE (A) NEGATIVE Final    Comment:        The GeneXpert MRSA Assay (FDA approved for NASAL specimens only), is one component of a comprehensive MRSA colonization surveillance program. It is not intended to diagnose MRSA infection nor to guide or monitor treatment for MRSA infections. RESULT CALLED TO, READ BACK BY AND VERIFIED WITH: SARA MOORE ON 02/03/2020 AT 1851 TIK Performed at [facility], [address] [place], [place] [address]           IMAGING    No results found.   Nutrition Status: Nutrition Problem: Moderate Malnutrition Etiology: social / environmental circumstances(inadequate oral intake, EtOH use) Signs/Symptoms: moderate fat depletion, moderate muscle depletion, severe muscle depletion Interventions: Refer to RD note for recommendations      ASSESSMENT AND PLAN SYNOPSIS  SEVERE ACUTE TOXIC METABOLIC ENCEPHALOPATHY ROM DT'S AND SEIZURES DUE TO ACUTE STROKES  SEVERE ALCOHOL WITHDRAWAL -Therapy with Thiamine and MVI -CIWA Protocol -Precedex as needed -high risk for aspiration     NEUROLOGY Acute CVA, acute delirium  Acute toxic metabolic encephalopathy, need for sedation On precedex   CARDIAC ICU monitoring  ID -continue IV abx as prescibed -follow up cultures  GI GI PROPHYLAXIS as indicated  NUTRITIONAL STATUS Nutrition Status: Nutrition Problem: Moderate Malnutrition Etiology: social / environmental circumstances(inadequate oral intake, EtOH use) Signs/Symptoms: moderate fat depletion, moderate muscle depletion, severe muscle depletion Interventions: Refer to RD note for recommendations   DIET-->TF's as tolerated Constipation protocol as indicated  ENDO - will use ICU hypoglycemic\Hyperglycemia protocol if indicated     ELECTROLYTES -follow labs as needed -replace as needed -pharmacy consultation and following   DVT/GI PRX ordered and assessed TRANSFUSIONS AS NEEDED MONITOR  FSBS I Assessed the need for Labs I Assessed the need for Foley I Assessed the need for Central Venous Line Family Discussion when available I Assessed the need for Mobilization I made an Assessment of medications to be adjusted accordingly Safety Risk assessment completed   CASE DISCUSSED IN MULTIDISCIPLINARY ROUNDS WITH ICU TEAM  Critical Care Time devoted to patient care services described in this note is 31 minutes.   Overall, patient is critically ill, prognosis is guarded.   PATIENT IS DNR/DNI      Lucie Leather, M.D.  [facility]  Medical Director [facility] Medical Director [facility] Cardio-Pulmonary Department

## 2020-02-07 NOTE — Progress Notes (Signed)
OT Cancellation Note  Patient Details Name: Carla Dickson MRN: [id number] DOB: [birth year]   Cancelled Treatment:    Reason Eval/Treat Not Completed: Patient's level of consciousness;Other (comment). OT continues to follow pt for OT eval. Upon attempt this am, pt primary RN notified this Thereasa Parkin pt remains on precedex, lethargic, oriented to self only, and unable to participate in therapy at this time. Will re-attempt next date as available and pt medically appropriate for OT eval.   Rockney Ghee, M.S., OTR/L Ascom: [phone] 02/07/20, 9:47 AM

## 2020-02-07 NOTE — Progress Notes (Signed)
Initial Nutrition Assessment  DOCUMENTATION CODES:   Non-severe (moderate) malnutrition in context of social or environmental circumstances  INTERVENTION:  Recommend liberalizing diet to regular.  Provide Ensure Enlive po TID, each supplement provides 350 kcal and 20 grams of protein. Patient prefers vanilla.  Monitor magnesium, potassium, and phosphorus daily for at least 3 days, MD to replete as needed, as pt is at risk for refeeding syndrome given moderate malnutrition, EtOH use.  NUTRITION DIAGNOSIS:   Moderate Malnutrition related to social / environmental circumstances(inadequate oral intake, EtOH use) as evidenced by moderate fat depletion, moderate muscle depletion, severe muscle depletion.  GOAL:   Patient will meet greater than or equal to 90% of their needs  MONITOR:   PO intake, Supplement acceptance, Labs, Weight trends, I & O's  REASON FOR ASSESSMENT:   Rounds    ASSESSMENT:   49 year old female with PMHx of HTN, EtOH use, seizures admitted with severe acute metabolic encephalopathy, severe EtOH withdrawal, also with potential subacute infarct in basal ganglia.   Met with patient at bedside this morning. RN was in room and reports patient had just been given Ativan after period of agitation. She also remains on Precedex gtt. Patient was sleepy but still able to answer a few questions. She reports that her appetite is good and that she eats well at meals "when she is ready to." Unable to describe her usual intake. Here she has not been able to eat well per RN and discussion on rounds. It appears she ate 70% of dinner on 5/11. Other meals are documented to be 0% to bites/sips. She is often too lethargic for safe PO intake. Patient is being followed by Palliative Care. Patient reports she enjoys vanilla Boost and is amenable to trying vanilla Ensure here. Patient not able to take oral medications reliably at this time so will hold off on MVI for now.  Patient is unsure  of her weight history but reports she has "always been small." According to weight history in chart patient appears to have had slow weight loss over several years. She was 76.2 kg on 12/17/2015, 72.6 kg on 07/28/2016, 66.2 kg on 11/23/2016, 63.5 kg on 01/27/2017, 54.4 kg on 04/13/2018, 59 kg on 01/28/2019, and is now 59.2 kg (130.51 lbs). She has remained weight-stable over the past year.  Medications reviewed and include: folic acid 1 mg daily, Protonix, ceftriaxone, Precedex gtt, Keppra, thiamine 500 mg IV daily.  Labs reviewed: Phosphorus 5.1 (from repletion as level was 1.7 yesterday).  Plan discussed on rounds is to liberalize diet and add oral nutrition supplements for when patient is alert enough for PO intake.  NUTRITION - FOCUSED PHYSICAL EXAM:    Most Recent Value  Orbital Region  Moderate depletion  Upper Arm Region  Moderate depletion  Thoracic and Lumbar Region  Mild depletion  Buccal Region  Moderate depletion  Temple Region  Moderate depletion  Clavicle Bone Region  Moderate depletion  Clavicle and Acromion Bone Region  Moderate depletion  Scapular Bone Region  Unable to assess  Dorsal Hand  Severe depletion  Patellar Region  Severe depletion  Anterior Thigh Region  Severe depletion  Posterior Calf Region  Severe depletion  Edema (RD Assessment)  None  Hair  Reviewed  Eyes  Unable to assess  Mouth  Unable to assess  Skin  Reviewed  Nails  Reviewed     Diet Order:   Diet Order            Diet  Heart Room service appropriate? Yes; Fluid consistency: Thin  Diet effective now             EDUCATION NEEDS:   No education needs have been identified at this time  Skin:  Skin Assessment: Reviewed RN Assessment  Last BM:  Unknown  Height:   Ht Readings from Last 1 Encounters:  02/03/20 _0  (1.626 m)   Weight:   Wt Readings from Last 1 Encounters:  02/05/20 59.2 kg   BMI:  Body mass index is 22.4 kg/m.  Estimated Nutritional Needs:   Kcal:   1500-1700  Protein:  75-85 grams  Fluid:  1.5-1.7 L/day  Jacklynn Barnacle, MS, RD, LDN Pager number available on Amion

## 2020-02-07 NOTE — Progress Notes (Signed)
Palliative: Carla Dickson is lying quietly in bed.  She will tell me her name, but is unable to tell me that we are in the hospital without prompting.  She appears acutely/chronically ill and frail.  She is able to take a sip of liquid from a cup without overt signs and symptoms of aspiration.  She tells me that she would like her meal.  I am unable to have meaningful conversation with her today, and there is no family at bedside at this time.  Call to father, Carla Dickson. We talk about sedation to keep her calm as she is still having alcohol withdrawal. We talk about finding a balance with sedation as she is detoxifying versus keeping her awake enough to participate in care, eat meals.  Carla Dickson tells me that Carla Dickson had been drinking 7 days a week, cant stop drinking.  He shares that if she is able to recover, he will have her move in with him so he can monitor her actions, hopefully assist her in sobriety. Carla Dickson states that he just found out yesterday that a man she had been seeing is HIV positive.  I share that Carla Dickson was tested this month and negative.   I share with Carla Dickson that we will continue to keep him updated.  Detail conference with hospice liaison.  At this point intranet does not have an admitting diagnosis for residential hospice.  If she worsens, develops infection, respiratory issues or the like, she would then likely qualify for residential hospice.  Conference with attending, bedside nursing staff, transition of care team, hospice liaison related to patient condition, needs, goals of care.  Plan:   Continue to treat the treatable, but no CPR or intubation.   Time for outcomes.  At this point Carla Dickson does not qualify for residential hospice care.    35 minutes Carla Carmel, NP Palliative Medicine Team Team Phone # [phone] Greater than 50% of that time was spent counseling and coordinating care related to the above assessment and plan.

## 2020-02-07 NOTE — Progress Notes (Signed)
Pharmacy Electrolyte Monitoring Consult:   Labs:  Sodium (mmol/L)  Date Value  02/07/2020 141  10/22/2014 139   Potassium (mmol/L)  Date Value  02/07/2020 3.5  10/22/2014 3.3 (L)   Magnesium (mg/dL)  Date Value  04/59/9774 1.7   Phosphorus (mg/dL)  Date Value  14/23/9532 5.1 (H)   Calcium (mg/dL)  Date Value  02/33/4356 9.1   Calcium, Total (mg/dL)  Date Value  86/16/8372 8.6   Albumin (g/dL)  Date Value  90/21/1155 3.1 (L)  10/22/2014 4.0    Assessment: 48y.o. female admitted to ICU on 02/03/2020 for seizures. Pharmacy consulted to assist in monitoring and replacing electrolytes  Goals of Therapy:  Electrolytes WNL, K ~4, Mg ~2 and phos ~3  Plan: Patient unable to safely take PO meds at this time. Electrolytes WNL, although potassium on low end of normal. Will defer replacement.  Will continue to monitor w/ AM labs and replace as needed.  Delfin Gant, PharmD Clinical Pharmacist 02/07/2020 2:24 PM

## 2020-02-08 DIAGNOSIS — E440 Moderate protein-calorie malnutrition: Secondary | ICD-10-CM | POA: Insufficient documentation

## 2020-02-08 DIAGNOSIS — Z515 Encounter for palliative care: Secondary | ICD-10-CM

## 2020-02-08 DIAGNOSIS — G40909 Epilepsy, unspecified, not intractable, without status epilepticus: Secondary | ICD-10-CM

## 2020-02-08 DIAGNOSIS — Z7189 Other specified counseling: Secondary | ICD-10-CM

## 2020-02-08 LAB — BASIC METABOLIC PANEL
Anion gap: 11 (ref 5–15)
BUN: 11 mg/dL (ref 6–20)
CO2: 29 mmol/L (ref 22–32)
Calcium: 9.3 mg/dL (ref 8.9–10.3)
Chloride: 101 mmol/L (ref 98–111)
Creatinine, Ser: 0.52 mg/dL (ref 0.44–1.00)
GFR calc Af Amer: >60 mL/min (ref >60)
GFR calc non Af Amer: >60 mL/min (ref >60)
Glucose, Bld: 120 mg/dL — ABNORMAL HIGH (ref 70–99)
Potassium: 3.5 mmol/L (ref 3.5–5.1)
Sodium: 141 mmol/L (ref 135–145)

## 2020-02-08 LAB — CBC WITH DIFFERENTIAL/PLATELET
Abs Immature Granulocytes: 0.03 10*3/uL (ref 0.00–0.07)
Basophils Absolute: 0 10*3/uL (ref 0.0–0.1)
Basophils Relative: 1 %
Eosinophils Absolute: 0.1 10*3/uL (ref 0.0–0.5)
Eosinophils Relative: 3 %
HCT: 38.9 % (ref 36.0–46.0)
Hemoglobin: 13.3 g/dL (ref 12.0–15.0)
Immature Granulocytes: 1 %
Lymphocytes Relative: 24 %
Lymphs Abs: 1.1 10*3/uL (ref 0.7–4.0)
MCH: 33.2 pg (ref 26.0–34.0)
MCHC: 34.2 g/dL (ref 30.0–36.0)
MCV: 97 fL (ref 80.0–100.0)
Monocytes Absolute: 0.5 10*3/uL (ref 0.1–1.0)
Monocytes Relative: 11 %
Neutro Abs: 2.8 10*3/uL (ref 1.7–7.7)
Neutrophils Relative %: 60 %
Platelets: 74 10*3/uL — ABNORMAL LOW (ref 150–400)
RBC: 4.01 MIL/uL (ref 3.87–5.11)
RDW: 15.9 % — ABNORMAL HIGH (ref 11.5–15.5)
WBC: 4.6 10*3/uL (ref 4.0–10.5)
nRBC: 0 % (ref 0.0–0.2)

## 2020-02-08 LAB — CULTURE, BLOOD (ROUTINE X 2)
Culture: NO GROWTH
Culture: NO GROWTH
Special Requests: ADEQUATE

## 2020-02-08 LAB — PHOSPHORUS: Phosphorus: 4.4 mg/dL (ref 2.5–4.6)

## 2020-02-08 LAB — MAGNESIUM: Magnesium: 1.6 mg/dL — ABNORMAL LOW (ref 1.7–2.4)

## 2020-02-08 MED ORDER — PHENOBARBITAL SODIUM 65 MG/ML IJ SOLN
65.0000 mg | Freq: Two times a day (BID) | INTRAMUSCULAR | Status: DC
  Administered 2020-02-11: 65 mg via INTRAVENOUS
  Filled 2020-02-08: qty 1

## 2020-02-08 MED ORDER — ENOXAPARIN SODIUM 40 MG/0.4ML ~~LOC~~ SOLN
40.0000 mg | SUBCUTANEOUS | Status: DC
  Administered 2020-02-08 – 2020-02-14 (×7): 40 mg via SUBCUTANEOUS
  Filled 2020-02-08 (×8): qty 0.4

## 2020-02-08 MED ORDER — MAGNESIUM SULFATE 2 GM/50ML IV SOLN
2.0000 g | Freq: Once | INTRAVENOUS | Status: AC
  Administered 2020-02-08: 2 g via INTRAVENOUS
  Filled 2020-02-08: qty 50

## 2020-02-08 MED ORDER — PHENOBARBITAL SODIUM 65 MG/ML IJ SOLN
65.0000 mg | Freq: Four times a day (QID) | INTRAMUSCULAR | Status: AC
  Administered 2020-02-08 (×3): 65 mg via INTRAVENOUS
  Filled 2020-02-08 (×3): qty 1

## 2020-02-08 MED ORDER — PHENOBARBITAL SODIUM 65 MG/ML IJ SOLN: 32.5000 mg | Freq: Two times a day (BID) | INTRAMUSCULAR | Status: DC

## 2020-02-08 MED ORDER — THIAMINE HCL 100 MG/ML IJ SOLN
100.0000 mg | Freq: Every day | INTRAMUSCULAR | Status: DC
  Administered 2020-02-08 – 2020-02-11 (×4): 100 mg via INTRAVENOUS
  Filled 2020-02-08 (×4): qty 2

## 2020-02-08 MED ORDER — PHENOBARBITAL SODIUM 65 MG/ML IJ SOLN
65.0000 mg | Freq: Four times a day (QID) | INTRAMUSCULAR | Status: AC
  Administered 2020-02-09 – 2020-02-10 (×4): 65 mg via INTRAVENOUS
  Filled 2020-02-08 (×4): qty 1

## 2020-02-08 MED ORDER — PHENOBARBITAL SODIUM 65 MG/ML IJ SOLN
65.0000 mg | Freq: Three times a day (TID) | INTRAMUSCULAR | Status: AC
  Administered 2020-02-10 (×3): 65 mg via INTRAVENOUS
  Filled 2020-02-08 (×3): qty 1

## 2020-02-08 MED ORDER — THIAMINE HCL 100 MG PO TABS
100.0000 mg | ORAL_TABLET | Freq: Every day | ORAL | Status: DC
  Administered 2020-02-12 – 2020-02-15 (×4): 100 mg via ORAL
  Filled 2020-02-08 (×4): qty 1

## 2020-02-08 NOTE — Progress Notes (Signed)
PT Cancellation Note  Patient Details Name: Carla Dickson MRN: [id number] DOB: 03-05-1971   Cancelled Treatment:    Reason Eval/Treat Not Completed: Patient not medically ready;Patient's level of consciousness Multiple days in a row now unable to assess this PT.  This am she was apparently agitated and pulling out IV, presedex increased and how pt is soundly sleeping. Given multiple sequential failed attempts we will complete PT orders at this time and drop from caseload.  Will need new orders when pt is appropriate for PT exam.  Signing off for now.   Malachi Pro, DPT 02/08/2020, 11:17 AM

## 2020-02-08 NOTE — Progress Notes (Signed)
End of Shift Summary:     Patient has had a fairly uneventful day.  Severe episodes of hallucinations mixed with lethargy and slurred speech. Patient has remained NPO for today.  Continues to be sedated with Precedex, PRN Ativan and occasionally received PRN morphine for pain.

## 2020-02-08 NOTE — Progress Notes (Signed)
Pharmacy Electrolyte Monitoring Consult:   Labs:  Sodium (mmol/L)  Date Value  02/08/2020 141  10/22/2014 139   Potassium (mmol/L)  Date Value  02/08/2020 3.5  10/22/2014 3.3 (L)   Magnesium (mg/dL)  Date Value  43/20/0379 1.6 (L)   Phosphorus (mg/dL)  Date Value  44/46/1901 4.4   Calcium (mg/dL)  Date Value  22/24/1146 9.3   Calcium, Total (mg/dL)  Date Value  43/14/2767 8.6   Albumin (g/dL)  Date Value  10/07/347 3.1 (L)  10/22/2014 4.0    Assessment: 49y.o. female admitted to ICU on 02/03/2020 for seizures. Pharmacy consulted to assist in monitoring and replacing electrolytes  Goals of Therapy:  Electrolytes WNL, K ~4, Mg ~2 and phos ~3  Plan: Patient unable to safely take PO meds at this time. Potassium and phos WNL, although potassium on low end of normal. Will defer replacement.   Mg 1.6 this AM and was replaced with Mg sulfate 2g IVPB x1 dose.   Will continue to monitor w/ AM labs and replace as needed.  Delfin Gant, PharmD Clinical Pharmacist 02/08/2020 11:01 AM

## 2020-02-08 NOTE — Progress Notes (Signed)
CRITICAL CARE NOTE 49 year old womanthe as noted below including seizure disorder and alcohol abuse who presented via EMS after she was found on the floor at home unresponsive.  The patient is noted to have abrasions and laceration on the right cheek and a hematoma on the right side of her face.  She has ecchymoses under her left eye. It was presumed she had a fall. Patient's family was present when EMS presented to the home.  previous seizures from alcohol withdrawal previously Patient is no seizure medications.   ER COURSE has had E. coli UTI previously.  She had a similar presentation in January of this year.  She was admitted by our group in similar circumstance   She is apparently not taking any medications at home.  Work-up in the emergency room has shown laboratory data as below. CT scan of the head shows a possible new focus of indeterminate age infarct is low ganglia.  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     SIGNIFICANT EVENTS 5/9admitted for severe acute ETOH encephalopathy CT head c/w possible new infarct 5/10-5/13 on precedex 5/13 patient now DNR/DNI 5/14 severe agitation  CC  follow upDT's  SUBJECTIVE Patient remains critically ill Prognosis is guarded On precedex   BP (!) 162/100   Pulse 73   Temp (!) 97.5 F (36.4 C) (Axillary)   Resp 12   Ht 5\' 4"  (1.626 m)   Wt 56.4 kg   SpO2 100%   BMI 21.34 kg/m    I/O last 3 completed shifts: In: 868.1 [I.V.:413.4; IV Piggyback:454.8] Out: 1825 [Urine:1825] Total I/O In: -  Out: 150 [Urine:150]  SpO2: 100 % O2 Flow Rate (L/min): 0 L/min FiO2 (%): 21 %  Estimated body mass index is 21.34 kg/m as calculated from the following:   Height as of this encounter: 5\' 4"  (1.626 m).   Weight as of this encounter: 56.4 kg.  SIGNIFICANT EVENTS   REVIEW OF SYSTEMS  PATIENT IS UNABLE TO PROVIDE COMPLETE REVIEW OF  SYSTEMS DUE TO SEVERE CRITICAL ILLNESS        PHYSICAL EXAMINATION:  GENERAL:critically ill appearing,  HEAD: Normocephalic, atraumatic.  EYES: Pupils equal, round, reactive to light.  No scleral icterus.  MOUTH: Moist mucosal membrane. NECK: Supple.  PULMONARY: +rhonchi,  CARDIOVASCULAR: S1 and S2. Regular rate and rhythm. No murmurs, rubs, or gallops.  GASTROINTESTINAL: Soft, nontender, -distended.  Positive bowel sounds.   MUSCULOSKELETAL: No swelling, clubbing, or edema.  NEUROLOGIC: obtunded, SKIN:intact,warm,dry  MEDICATIONS: I have reviewed all medications and confirmed regimen as documented   CULTURE RESULTS   Recent Results (from the past 240 hour(s))  Urine culture     Status: Abnormal   Collection Time: 02/03/20  2:29 PM   Specimen: Urine, Catheterized  Result Value Ref Range Status   Specimen Description   Final    URINE, CATHETERIZED Performed at [facility] [address], [place], [address] [place]    Special Requests   Final    NONE Performed at [facility], [address] [place], [address] [place]    Culture MULTIPLE SPECIES PRESENT, SUGGEST RECOLLECTION (A)  Final   Report Status 02/04/2020 FINAL  Final  Culture, blood (routine x 2)     Status: None   Collection Time: 02/03/20  3:09 PM   Specimen: BLOOD  Result Value Ref Range Status   Specimen Description BLOOD R HAND  Final   Special Requests   Final  BOTTLES DRAWN AEROBIC AND ANAEROBIC Blood Culture results may not be optimal due to an inadequate volume of blood received in culture bottles   Culture   Final    NO GROWTH 5 DAYS Performed at [facility], [address] [place], [place] [address]    Report Status 02/08/2020 FINAL  Final  Culture, blood (routine x 2)     Status: None   Collection Time: 02/03/20  3:09 PM   Specimen: BLOOD  Result Value Ref Range Status   Specimen Description BLOOD L H  Final   Special Requests   Final    BOTTLES DRAWN  AEROBIC AND ANAEROBIC Blood Culture adequate volume   Culture   Final    NO GROWTH 5 DAYS Performed at [facility], [address], [place], [place] [address]    Report Status 02/08/2020 FINAL  Final  Respiratory Panel by RT PCR (Flu A&B, Covid) - Nasopharyngeal Swab     Status: None   Collection Time: 02/03/20  3:34 PM   Specimen: Nasopharyngeal Swab  Result Value Ref Range Status   SARS Coronavirus 2 by RT PCR NEGATIVE NEGATIVE Final    Comment: (NOTE) SARS-CoV-2 target nucleic acids are NOT DETECTED. The SARS-CoV-2 RNA is generally detectable in upper respiratoy specimens during the acute phase of infection. The lowest concentration of SARS-CoV-2 viral copies this assay can detect is 131 copies/mL. A negative result does not preclude SARS-Cov-2 infection and should not be used as the sole basis for treatment or other patient management decisions. A negative result may occur with  improper specimen collection/handling, submission of specimen other than nasopharyngeal swab, presence of viral mutation(s) within the areas targeted by this assay, and inadequate number of viral copies (<131 copies/mL). A negative result must be combined with clinical observations, patient history, and epidemiological information. The expected result is Negative. Fact Sheet for Patients:  https://www.moore.com/ Fact Sheet for Healthcare Providers:  https://www.young.biz/ This test is not yet ap proved or cleared by the [place] FDA and  has been authorized for detection and/or diagnosis of SARS-CoV-2 by FDA under an Emergency Use Authorization (EUA). This EUA will remain  in effect (meaning this test can be used) for the duration of the COVID-19 declaration under Section 564(b)(1) of the Act, 21 U.S.C. section 360bbb-3(b)(1), unless the authorization is terminated or revoked sooner.    Influenza A by PCR NEGATIVE NEGATIVE Final   Influenza B by  PCR NEGATIVE NEGATIVE Final    Comment: (NOTE) The Xpert Xpress SARS-CoV-2/FLU/RSV assay is intended as an aid in  the diagnosis of influenza from Nasopharyngeal swab specimens and  should not be used as a sole basis for treatment. Nasal washings and  aspirates are unacceptable for Xpert Xpress SARS-CoV-2/FLU/RSV  testing. Fact Sheet for Patients: https://www.moore.com/ Fact Sheet for Healthcare Providers: https://www.young.biz/ This test is not yet approved or cleared by the [place] FDA and  has been authorized for detection and/or diagnosis of SARS-CoV-2 by  FDA under an Emergency Use Authorization (EUA). This EUA will remain  in effect (meaning this test can be used) for the duration of the  Covid-19 declaration under Section 564(b)(1) of the Act, 21  U.S.C. section 360bbb-3(b)(1), unless the authorization is  terminated or revoked. Performed at [facility], [address] [place], [place] [address]   MRSA PCR Screening     Status: Abnormal   Collection Time: 02/03/20  5:15 PM   Specimen: Nasopharyngeal  Result Value Ref Range Status  MRSA by PCR POSITIVE (A) NEGATIVE Final    Comment:        The GeneXpert MRSA Assay (FDA approved for NASAL specimens only), is one component of a comprehensive MRSA colonization surveillance program. It is not intended to diagnose MRSA infection nor to guide or monitor treatment for MRSA infections. RESULT CALLED TO, READ BACK BY AND VERIFIED WITH: SARA MOORE ON 02/03/2020 AT 1851 TIK Performed at [facility], [address] [place], [place] [address]           IMAGING    No results found.   Nutrition Status: Nutrition Problem: Moderate Malnutrition Etiology: social / environmental circumstances(inadequate oral intake, EtOH use) Signs/Symptoms: moderate fat depletion, moderate muscle depletion, severe muscle depletion Interventions: Refer to RD note for  recommendations     ASSESSMENT AND PLAN SYNOPSIS  SEVERE ACUTE TOXIC METABOLIC ENCEPHALOPATHY DUEto  DT'S AND SEIZURES DUE TO ACUTE STROKES   SEVERE ALCOHOL WITHDRAWAL -Therapy with Thiamine and MVI -CIWA Protocol -Precedex as needed -High risk for intubation  -high risk for aspiration      NEUROLOGY Acute CVA  Acute toxic metabolic encephalopathy, need for sedation    CARDIAC ICU monitoring   GI GI PROPHYLAXIS as indicated  NUTRITIONAL STATUS Nutrition Status: Nutrition Problem: Moderate Malnutrition Etiology: social / environmental circumstances(inadequate oral intake, EtOH use) Signs/Symptoms: moderate fat depletion, moderate muscle depletion, severe muscle depletion Interventions: Refer to RD note for recommendations   DIET-->as tolerated Constipation protocol as indicated  ENDO - will use ICU hypoglycemic\Hyperglycemia protocol if indicated     ELECTROLYTES -follow labs as needed -replace as needed -pharmacy consultation and following   DVT/GI PRX ordered and assessed TRANSFUSIONS AS NEEDED MONITOR FSBS I Assessed the need for Labs I Assessed the need for Foley I Assessed the need for Central Venous Line Family Discussion when available I Assessed the need for Mobilization I made an Assessment of medications to be adjusted accordingly Safety Risk assessment completed   CASE DISCUSSED IN [place] ICU TEAM  Patient is DNR/DNI  Critical Care Time devoted to patient care services described in this note is 32 minutes.   Overall, patient is critically ill, prognosis is guarded.     Corrin Parker, M.D.  [facility]  Medical Director [place] Director [facility] Cardio-Pulmonary Department
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
## 2020-02-08 NOTE — Progress Notes (Signed)
Patient ripping off gown, purewick, swinging and kicking this morning.  Unable to be redirected.  Precedex increased.  Patient now resting comfortably in bed.

## 2020-02-08 NOTE — Progress Notes (Signed)
OT Cancellation Note  Patient Details Name: Carla Dickson MRN: [id number] DOB: 10/24/1970   Cancelled Treatment:    Reason Eval/Treat Not Completed: Patient not medically ready. Pt has been unable to participate in OT evaluation for multiple consecutive days. Per RN, pt has been hallucinating and agitated requiring increased presedex. Will sign off at this time. Please re-consult when pt is appropriate and able to participate in therapy.   Kathie Dike, M.S. OTR/L  02/08/20, 12:07 PM

## 2020-02-09 DIAGNOSIS — E440 Moderate protein-calorie malnutrition: Secondary | ICD-10-CM

## 2020-02-09 LAB — BASIC METABOLIC PANEL
Anion gap: 7 (ref 5–15)
BUN: 18 mg/dL (ref 6–20)
CO2: 29 mmol/L (ref 22–32)
Calcium: 8.9 mg/dL (ref 8.9–10.3)
Chloride: 104 mmol/L (ref 98–111)
Creatinine, Ser: 0.62 mg/dL (ref 0.44–1.00)
GFR calc Af Amer: >60 mL/min (ref >60)
GFR calc non Af Amer: >60 mL/min (ref >60)
Glucose, Bld: 96 mg/dL (ref 70–99)
Potassium: 3.9 mmol/L (ref 3.5–5.1)
Sodium: 140 mmol/L (ref 135–145)

## 2020-02-09 LAB — CBC WITH DIFFERENTIAL/PLATELET
Abs Immature Granulocytes: 0.04 10*3/uL (ref 0.00–0.07)
Basophils Absolute: 0 10*3/uL (ref 0.0–0.1)
Basophils Relative: 0 %
Eosinophils Absolute: 0 10*3/uL (ref 0.0–0.5)
Eosinophils Relative: 0 %
HCT: 40.2 % (ref 36.0–46.0)
Hemoglobin: 13.7 g/dL (ref 12.0–15.0)
Immature Granulocytes: 1 %
Lymphocytes Relative: 13 %
Lymphs Abs: 0.9 10*3/uL (ref 0.7–4.0)
MCH: 33.3 pg (ref 26.0–34.0)
MCHC: 34.1 g/dL (ref 30.0–36.0)
MCV: 97.6 fL (ref 80.0–100.0)
Monocytes Absolute: 0.8 10*3/uL (ref 0.1–1.0)
Monocytes Relative: 11 %
Neutro Abs: 5.1 10*3/uL (ref 1.7–7.7)
Neutrophils Relative %: 75 %
Platelets: 98 10*3/uL — ABNORMAL LOW (ref 150–400)
RBC: 4.12 MIL/uL (ref 3.87–5.11)
RDW: 16.4 % — ABNORMAL HIGH (ref 11.5–15.5)
WBC: 6.8 10*3/uL (ref 4.0–10.5)
nRBC: 0 % (ref 0.0–0.2)

## 2020-02-09 LAB — URINALYSIS, COMPLETE (UACMP) WITH MICROSCOPIC
Bacteria, UA: NONE SEEN
Glucose, UA: 50 mg/dL — AB
Ketones, ur: 5 mg/dL — AB
Nitrite: NEGATIVE
Protein, ur: 100 mg/dL — AB
Specific Gravity, Urine: 1.027 (ref 1.005–1.030)
pH: 5 (ref 5.0–8.0)

## 2020-02-09 LAB — PHOSPHORUS: Phosphorus: 5.5 mg/dL — ABNORMAL HIGH (ref 2.5–4.6)

## 2020-02-09 LAB — MAGNESIUM: Magnesium: 1.9 mg/dL (ref 1.7–2.4)

## 2020-02-09 MED ORDER — SODIUM CHLORIDE 0.9 % IV BOLUS
1000.0000 mL | Freq: Once | INTRAVENOUS | Status: AC
  Administered 2020-02-09: 1000 mL via INTRAVENOUS

## 2020-02-09 MED ORDER — SODIUM CHLORIDE 0.9 % IV SOLN
INTRAVENOUS | Status: DC | PRN
  Administered 2020-02-09 – 2020-02-14 (×2): 250 mL via INTRAVENOUS

## 2020-02-09 NOTE — Plan of Care (Signed)

## 2020-02-09 NOTE — Progress Notes (Signed)
CRITICAL CARE NOTE 49 year old womanthe as noted below including seizure disorder and alcohol abuse who presented via EMS after she was found on the floor at home unresponsive.  The patient is noted to have abrasions and laceration on the right cheek and a hematoma on the right side of her face.  She has ecchymoses under her left eye. It was presumed she had a fall. Patient's family was present when EMS presented to the home.  previous seizures from alcohol withdrawal previously Patient is no seizure medications.   ER COURSE has had E. coli UTI previously.  She had a similar presentation in January of this year.  She was admitted by our group in similar circumstance   She is apparently not taking any medications at home.  Work-up in the emergency room has shown laboratory data as below. CT scan of the head shows a possible new focus of indeterminate age infarct is low ganglia.  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     SIGNIFICANT EVENTS 5/9admitted for severe acute ETOH encephalopathy CT head c/w possible new infarct 5/10-5/13 on precedex 5/13 patient now DNR/DNI 5/14 severe agitation  CC  follow up rdt'S  SUBJECTIVE Patient remains critically ill Prognosis is guarded  Severe episodes of hallucinations mixed with lethargy and slurred speech. Patient has remained NPO for today.  Continues to be sedated with Precedex, PRN Ativan and occasionally received PRN morphine for pain.      BP 134/88   Pulse 87   Temp (!) 97.5 F (36.4 C) (Oral)   Resp 16   Ht 5\' 4"  (1.626 m)   Wt 56.4 kg   SpO2 98%   BMI 21.34 kg/m    I/O last 3 completed shifts: In: 1296.5 [P.O.:240; I.V.:656.5; IV Piggyback:400] Out: 800 [Urine:800] No intake/output data recorded.  SpO2: 98 % O2 Flow Rate (L/min): 0 L/min FiO2 (%): 21 %  Estimated body mass index is 21.34 kg/m as calculated from the  following:   Height as of this encounter: 5\' 4"  (1.626 m).   Weight as of this encounter: 56.4 kg.  SIGNIFICANT EVENTS   REVIEW OF SYSTEMS  PATIENT IS UNABLE TO PROVIDE COMPLETE REVIEW OF SYSTEMS DUE TO SEVERE CRITICAL ILLNESS        PHYSICAL EXAMINATION:  GENERAL:critically ill appearing, HEAD: Normocephalic, atraumatic.  EYES: Pupils equal, round, reactive to light.  No scleral icterus.  MOUTH: Moist mucosal membrane. NECK: Supple.  PULMONARY: +rhonchi, CARDIOVASCULAR: S1 and S2. Regular rate and rhythm. No murmurs, rubs, or gallops.  GASTROINTESTINAL: Soft, nontender, -distended.  Positive bowel sounds.   MUSCULOSKELETAL: No swelling, clubbing, or edema.  NEUROLOGIC: obtunded SKIN:intact,warm,dry  MEDICATIONS: I have reviewed all medications and confirmed regimen as documented   CULTURE RESULTS   Recent Results (from the past 240 hour(s))  Urine culture     Status: Abnormal   Collection Time: 02/03/20  2:29 PM   Specimen: Urine, Catheterized  Result Value Ref Range Status   Specimen Description   Final    URINE, CATHETERIZED Performed at [facility], [address] [place], [address] [place]    Special Requests   Final    NONE Performed at [facility], [address] [place], [address] [place]    Culture MULTIPLE SPECIES PRESENT, SUGGEST RECOLLECTION (A)  Final   Report Status 02/04/2020 FINAL  Final  Culture, blood (routine x 2)     Status: None   Collection Time: 02/03/20  3:09 PM   Specimen: BLOOD  Result Value Ref Range Status   Specimen Description BLOOD R HAND  Final   Special Requests   Final    BOTTLES DRAWN AEROBIC AND ANAEROBIC Blood Culture results may not be optimal due to an inadequate volume of blood received in culture bottles   Culture   Final    NO GROWTH 5 DAYS Performed at [facility], [address] [place], [place] [address]    Report Status 02/08/2020 FINAL  Final  Culture, blood (routine x 2)      Status: None   Collection Time: 02/03/20  3:09 PM   Specimen: BLOOD  Result Value Ref Range Status   Specimen Description BLOOD L H  Final   Special Requests   Final    BOTTLES DRAWN AEROBIC AND ANAEROBIC Blood Culture adequate volume   Culture   Final    NO GROWTH 5 DAYS Performed at [facility], [address] [place], [place] [address]    Report Status 02/08/2020 FINAL  Final  Respiratory Panel by RT PCR (Flu A&B, Covid) - Nasopharyngeal Swab     Status: None   Collection Time: 02/03/20  3:34 PM   Specimen: Nasopharyngeal Swab  Result Value Ref Range Status   SARS Coronavirus 2 by RT PCR NEGATIVE NEGATIVE Final    Comment: (NOTE) SARS-CoV-2 target nucleic acids are NOT DETECTED. The SARS-CoV-2 RNA is generally detectable in upper respiratoy specimens during the acute phase of infection. The lowest concentration of SARS-CoV-2 viral copies this assay can detect is 131 copies/mL. A negative result does not preclude SARS-Cov-2 infection and should not be used as the sole basis for treatment or other patient management decisions. A negative result may occur with  improper specimen collection/handling, submission of specimen other than nasopharyngeal swab, presence of viral mutation(s) within the areas targeted by this assay, and inadequate number of viral copies (<131 copies/mL). A negative result must be combined with clinical observations, patient history, and epidemiological information. The expected result is Negative. Fact Sheet for Patients:  PinkCheek.be Fact Sheet for Healthcare Providers:  GravelBags.it This test is not yet ap proved or cleared by the [place] FDA and  has been authorized for detection and/or diagnosis of SARS-CoV-2 by FDA under an Emergency Use Authorization (EUA). This EUA will remain  in effect (meaning this test can be used) for the duration of the COVID-19 declaration under  Section 564(b)(1) of the Act, 21 U.S.C. section 360bbb-3(b)(1), unless the authorization is terminated or revoked sooner.    Influenza A by PCR NEGATIVE NEGATIVE Final   Influenza B by PCR NEGATIVE NEGATIVE Final    Comment: (NOTE) The Xpert Xpress SARS-CoV-2/FLU/RSV assay is intended as an aid in  the diagnosis of influenza from Nasopharyngeal swab specimens and  should not be used as a sole basis for treatment. Nasal washings and  aspirates are unacceptable for Xpert Xpress SARS-CoV-2/FLU/RSV  testing. Fact Sheet for Patients: PinkCheek.be Fact Sheet for Healthcare Providers: GravelBags.it This test is not yet approved or cleared by the [place] FDA and  has been authorized for detection and/or diagnosis of SARS-CoV-2 by  FDA under an Emergency Use Authorization (EUA). This EUA will remain  in effect (meaning this test can be used) for the duration of the  Covid-19 declaration under Section 564(b)(1) of the Act, 21  U.S.C. section 360bbb-3(b)(1), unless the authorization is  terminated or revoked. Performed at [facility], [address] [place], [place] [address]  MRSA PCR Screening     Status: Abnormal   Collection Time: 02/03/20  5:15 PM   Specimen: Nasopharyngeal  Result Value Ref Range Status   MRSA by PCR POSITIVE (A) NEGATIVE Final    Comment:        The GeneXpert MRSA Assay (FDA approved for NASAL specimens only), is one component of a comprehensive MRSA colonization surveillance program. It is not intended to diagnose MRSA infection nor to guide or monitor treatment for MRSA infections. RESULT CALLED TO, READ BACK BY AND VERIFIED WITH: SARA MOORE ON 02/03/2020 AT 1851 TIK Performed at [facility], [address], [place], [place] [address]           IMAGING    No results found.   Nutrition Status: Nutrition Problem: Moderate Malnutrition Etiology: social /  environmental circumstances(inadequate oral intake, EtOH use) Signs/Symptoms: moderate fat depletion, moderate muscle depletion, severe muscle depletion Interventions: Refer to RD note for recommendations     ASSESSMENT AND PLAN SYNOPSIS   SEVERE ACUTE TOXIC METABOLIC ENCEPHALOPATHY DUEto  DT'S AND SEIZURES DUE TO ACUTE STROKES   ETOH abuse with Severe Delirium Tremens -patient remains very agitated -needs ICU monitoring -Precedex infusion -CIWA protocol   SEVERE ALCOHOL WITHDRAWAL -Therapy with Thiamine and MVI -high risk for aspiration    NEUROLOGY Acute CVA   CARDIAC ICU monitoring   GI GI PROPHYLAXIS as indicated  NUTRITIONAL STATUS Nutrition Status: Nutrition Problem: Moderate Malnutrition Etiology: social / environmental circumstances(inadequate oral intake, EtOH use) Signs/Symptoms: moderate fat depletion, moderate muscle depletion, severe muscle depletion Interventions: Refer to RD note for recommendations   DIET--> as tolerated Constipation protocol as indicated  ENDO - will use ICU hypoglycemic\Hyperglycemia protocol if indicated     ELECTROLYTES -follow labs as needed -replace as needed -pharmacy consultation and following   DVT/GI PRX ordered and assessed TRANSFUSIONS AS NEEDED MONITOR FSBS I Assessed the need for Labs I Assessed the need for Foley I Assessed the need for Central Venous Line Family Discussion when available I Assessed the need for Mobilization I made an Assessment of medications to be adjusted accordingly Safety Risk assessment completed   CASE DISCUSSED IN MULTIDISCIPLINARY ROUNDS WITH ICU TEAM  Critical Care Time devoted to patient care services described in this note is 32 minutes.   Overall, patient is critically ill, prognosis is guarded.     Lucie Leather, M.D.  [facility]  Medical Director [facility] Medical Director [facility] Cardio-Pulmonary Department

## 2020-02-09 NOTE — Progress Notes (Signed)
Shift summary:  - Mentation improved this AM: patient is able to accurately state name, DOB, location, and reason for admission; confused to time.  - Weaning Precedex as tolerated.

## 2020-02-09 NOTE — Progress Notes (Signed)
Pharmacy Electrolyte Monitoring Consult:   Labs:  Sodium (mmol/L)  Date Value  02/09/2020 140  10/22/2014 139   Potassium (mmol/L)  Date Value  02/09/2020 3.9  10/22/2014 3.3 (L)   Magnesium (mg/dL)  Date Value  47/42/5956 1.9   Phosphorus (mg/dL)  Date Value  38/75/6433 5.5 (H)   Calcium (mg/dL)  Date Value  29/51/8841 8.9   Calcium, Total (mg/dL)  Date Value  66/02/3015 8.6   Albumin (g/dL)  Date Value  10/05/3233 3.1 (L)  10/22/2014 4.0    Assessment: 48y.o. female admitted to ICU on 02/03/2020 for seizures. Pharmacy consulted to assist in monitoring and replacing electrolytes. Patient unable to safely take PO meds at this time.  Goals of Therapy:  Potassium 4.0 - 5.1 mmol/L Magnesium 2.0 - 2.4 mg/dL Other Electrolytes WNL  Plan: No electrolyte replacement warranted at this time  Will continue to monitor w/ AM labs and replace as needed.  Burnis Medin, PharmD Clinical Pharmacist 02/09/2020 10:43 AM

## 2020-02-10 LAB — BASIC METABOLIC PANEL
Anion gap: 9 (ref 5–15)
BUN: 18 mg/dL (ref 6–20)
CO2: 24 mmol/L (ref 22–32)
Calcium: 8.6 mg/dL — ABNORMAL LOW (ref 8.9–10.3)
Chloride: 106 mmol/L (ref 98–111)
Creatinine, Ser: 0.55 mg/dL (ref 0.44–1.00)
GFR calc Af Amer: >60 mL/min (ref >60)
GFR calc non Af Amer: >60 mL/min (ref >60)
Glucose, Bld: 82 mg/dL (ref 70–99)
Potassium: 3.5 mmol/L (ref 3.5–5.1)
Sodium: 139 mmol/L (ref 135–145)

## 2020-02-10 LAB — PHOSPHORUS: Phosphorus: 4 mg/dL (ref 2.5–4.6)

## 2020-02-10 LAB — MAGNESIUM: Magnesium: 1.6 mg/dL — ABNORMAL LOW (ref 1.7–2.4)

## 2020-02-10 MED ORDER — MAGNESIUM SULFATE 2 GM/50ML IV SOLN
2.0000 g | Freq: Once | INTRAVENOUS | Status: AC
  Administered 2020-02-10: 2 g via INTRAVENOUS
  Filled 2020-02-10: qty 50

## 2020-02-10 MED ORDER — POTASSIUM CHLORIDE IN NACL 20-0.9 MEQ/L-% IV SOLN
INTRAVENOUS | Status: DC
  Administered 2020-02-10: 75 mL/h via INTRAVENOUS
  Filled 2020-02-10 (×3): qty 1000

## 2020-02-10 MED ORDER — SODIUM CHLORIDE 0.9 % IV SOLN: INTRAVENOUS | Status: DC

## 2020-02-10 NOTE — Progress Notes (Signed)
Pharmacy Electrolyte Monitoring Consult:   Labs:  Sodium (mmol/L)  Date Value  02/10/2020 139  10/22/2014 139   Potassium (mmol/L)  Date Value  02/10/2020 3.5  10/22/2014 3.3 (L)   Magnesium (mg/dL)  Date Value  00/17/4944 1.6 (L)   Phosphorus (mg/dL)  Date Value  96/75/9163 4.0   Calcium (mg/dL)  Date Value  84/66/5993 8.6 (L)   Calcium, Total (mg/dL)  Date Value  57/09/7791 8.6   Albumin (g/dL)  Date Value  90/30/0923 3.1 (L)  10/22/2014 4.0   Corrected Ca: 9.3 mg/dL  Assessment: 30Q.o. female admitted to ICU on 02/03/2020 for seizures. Pharmacy consulted to assist in monitoring and replacing electrolytes. Patient unable to safely take PO meds at this time.  Goals of Therapy:  Potassium 4.0 - 5.1 mmol/L Magnesium 2.0 - 2.4 mg/dL Other Electrolytes WNL  Plan:  Magnesium sulfate 2 grams IV x 1  MIVF: NS at 75 mL/hr  Add 20 mEq KCl/L and continue at 75 mL/hr  Will continue to monitor w/ AM labs and replace as needed.  Burnis Medin, PharmD Clinical Pharmacist 02/10/2020 6:58 AM

## 2020-02-10 NOTE — Progress Notes (Signed)
End of Shift Summary:     Patient alert and oriented x 4 throughout this shift.  Patient did complain of nausea and abdominal pain.  Medicated with zofran around lunch, effective for relief.  Patient has had poor PO intake this day.  Patient complained of generalized pain, and back pain from her sleeping position as well this day.  She requested pain medication a couple times throughout the day.  Effective for relief, however it makes her lethargic.  At one point patient became very agitated stating that she "has been in the hospital long enough and that she was ready to get up and get out of here".  I explained to the patient that she needs to discuss this with the doctor and focus on feeling better before she can go home.  PRN ativan given once related to anxiety related to wanting to "get the hell out of this place and go home".  Effective for relief.  Family has called for updates throughout the shift.

## 2020-02-10 NOTE — Progress Notes (Signed)
CRITICAL CARE NOTE  49 year old womanthe as noted below including seizure disorder and alcohol abuse who presented via EMS after she was found on the floor at home unresponsive.  The patient is noted to have abrasions and laceration on the right cheek and a hematoma on the right side of her face.  She has ecchymoses under her left eye. It was presumed she had a fall. Patient's family was present when EMS presented to the home.  previous seizures from alcohol withdrawal previously Patient is no seizure medications.   ER COURSE has had E. coli UTI previously.  She had a similar presentation in January of this year.  She was admitted by our group in similar circumstance  She is apparently not taking any medications at home.  Work-up in the emergency room has shown laboratory data as below. CT scan of the head shows a possible new focus of indeterminate age infarct is low ganglia.  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     SIGNIFICANT EVENTS 5/9admitted for severe acute ETOH encephalopathy CT head c/w possible new infarct 5/10-5/13 on precedex 5/13 patient now DNR/DNI 5/14 severe agitation 5/15 agitation slowly improving, but remains on precedex   CC  follow up DT's  SUBJECTIVE Patient remains critically ill Prognosis is guarded On precedex High risk for aspiration   BP (!) 158/90   Pulse (!) 114   Temp 98.1 F (36.7 C) (Oral)   Resp 18   Ht 5\' 4"  (1.626 m)   Wt 56.4 kg   SpO2 100%   BMI 21.34 kg/m    I/O last 3 completed shifts: In: 2384.6 [P.O.:118; I.V.:966.6; IV Piggyback:1300] Out: 420 [Urine:420] No intake/output data recorded.  SpO2: 100 % O2 Flow Rate (L/min): 0 L/min FiO2 (%): 21 %  Estimated body mass index is 21.34 kg/m as calculated from the following:   Height as of this encounter: 5\' 4"  (1.626 m).   Weight as of this encounter: 56.4 kg.  SIGNIFICANT  EVENTS   REVIEW OF SYSTEMS  PATIENT IS UNABLE TO PROVIDE COMPLETE REVIEW OF SYSTEMS DUE TO SEVERE DT's      PHYSICAL EXAMINATION:  GENERAL:critically ill appearing,  HEAD: Normocephalic, atraumatic.  EYES: Pupils equal, round, reactive to light.  No scleral icterus.  MOUTH: Moist mucosal membrane. NECK: Supple.  PULMONARY: +rhonchi,  CARDIOVASCULAR: S1 and S2. Regular rate and rhythm. No murmurs, rubs, or gallops.  GASTROINTESTINAL: Soft, nontender, -distended.  Positive bowel sounds.   MUSCULOSKELETAL: No swelling, clubbing, or edema.  NEUROLOGIC: obtunded SKIN:intact,warm,dry  MEDICATIONS: I have reviewed all medications and confirmed regimen as documented   CULTURE RESULTS   Recent Results (from the past 240 hour(s))  Urine culture     Status: Abnormal   Collection Time: 02/03/20  2:29 PM   Specimen: Urine, Catheterized  Result Value Ref Range Status   Specimen Description   Final    URINE, CATHETERIZED Performed at [facility], [address] [place], [address] [place]    Special Requests   Final    NONE Performed at [facility], [address] [place], [address] [place]    Culture MULTIPLE SPECIES PRESENT, SUGGEST RECOLLECTION (A)  Final   Report Status 02/04/2020 FINAL  Final  Culture, blood (routine x 2)     Status: None   Collection Time: 02/03/20  3:09 PM   Specimen: BLOOD  Result Value Ref Range Status   Specimen Description BLOOD R HAND  Final   Special Requests   Final    BOTTLES DRAWN AEROBIC AND ANAEROBIC Blood Culture results may not be optimal due to an inadequate volume of blood received in culture bottles   Culture   Final    NO GROWTH 5 DAYS Performed at [facility], [address] [place], [place] [address]    Report Status 02/08/2020 FINAL  Final  Culture, blood (routine x 2)     Status: None   Collection Time: 02/03/20  3:09 PM   Specimen: BLOOD  Result Value Ref Range Status   Specimen Description  BLOOD L H  Final   Special Requests   Final    BOTTLES DRAWN AEROBIC AND ANAEROBIC Blood Culture adequate volume   Culture   Final    NO GROWTH 5 DAYS Performed at [facility], [address] [place], [place] [address]    Report Status 02/08/2020 FINAL  Final  Respiratory Panel by RT PCR (Flu A&B, Covid) - Nasopharyngeal Swab     Status: None   Collection Time: 02/03/20  3:34 PM   Specimen: Nasopharyngeal Swab  Result Value Ref Range Status   SARS Coronavirus 2 by RT PCR NEGATIVE NEGATIVE Final    Comment: (NOTE) SARS-CoV-2 target nucleic acids are NOT DETECTED. The SARS-CoV-2 RNA is generally detectable in upper respiratoy specimens during the acute phase of infection. The lowest concentration of SARS-CoV-2 viral copies this assay can detect is 131 copies/mL. A negative result does not preclude SARS-Cov-2 infection and should not be used as the sole basis for treatment or other patient management decisions. A negative result may occur with  improper specimen collection/handling, submission of specimen other than nasopharyngeal swab, presence of viral mutation(s) within the areas targeted by this assay, and inadequate number of viral copies (<131 copies/mL). A negative result must be combined with clinical observations, patient history, and epidemiological information. The expected result is Negative. Fact Sheet for Patients:  PinkCheek.be Fact Sheet for Healthcare Providers:  GravelBags.it This test is not yet ap proved or cleared by the [place] FDA and  has been authorized for detection and/or diagnosis of SARS-CoV-2 by FDA under an Emergency Use Authorization (EUA). This EUA will remain  in effect (meaning this test can be used) for the duration of the COVID-19 declaration under Section 564(b)(1) of the Act, 21 U.S.C. section 360bbb-3(b)(1), unless the authorization is terminated or revoked sooner.     Influenza A by PCR NEGATIVE NEGATIVE Final   Influenza B by PCR NEGATIVE NEGATIVE Final    Comment: (NOTE) The Xpert Xpress SARS-CoV-2/FLU/RSV assay is intended as an aid in  the diagnosis of influenza from Nasopharyngeal swab specimens and  should not be used as a sole basis for treatment. Nasal washings and  aspirates are unacceptable for Xpert Xpress SARS-CoV-2/FLU/RSV  testing. Fact Sheet for Patients: PinkCheek.be Fact Sheet for Healthcare Providers: GravelBags.it This test is not yet approved or cleared by the [place] FDA and  has been authorized for detection and/or diagnosis of SARS-CoV-2 by  FDA under an Emergency Use Authorization (EUA). This EUA will remain  in effect (meaning this test can be used) for the duration of the  Covid-19 declaration under Section 564(b)(1) of the Act, 21  U.S.C. section 360bbb-3(b)(1), unless the authorization is  terminated or revoked. Performed at [facility], [address], [place],  [address]   MRSA PCR Screening     Status: Abnormal   Collection Time: 02/03/20  5:15 PM  Specimen: Nasopharyngeal  Result Value Ref Range Status   MRSA by PCR POSITIVE (A) NEGATIVE Final    Comment:        The GeneXpert MRSA Assay (FDA approved for NASAL specimens only), is one component of a comprehensive MRSA colonization surveillance program. It is not intended to diagnose MRSA infection nor to guide or monitor treatment for MRSA infections. RESULT CALLED TO, READ BACK BY AND VERIFIED WITH: SARA MOORE ON 02/03/2020 AT 1851 TIK Performed at [facility], [address] [place], [place] [address]           IMAGING    No results found.   Nutrition Status: Nutrition Problem: Moderate Malnutrition Etiology: social / environmental circumstances(inadequate oral intake, EtOH use) Signs/Symptoms: moderate fat depletion, moderate muscle depletion, severe  muscle depletion Interventions: Refer to RD note for recommendations    ASSESSMENT AND PLAN SYNOPSIS  SEVERE ACUTE TOXIC METABOLIC ENCEPHALOPATHYDUEtoDT'S AND SEIZURES DUE TO ACUTE STROKES   ETOH abuse with Severe Delirium Tremens -patient remains agitated -needs ICU monitoring   SEVERE ALCOHOL WITHDRAWAL -Therapy with Thiamine and MVI -CIWA Protocol -Precedex as needed -high risk for aspiration    NEUROLOGY Acute CVA Needs PT/OT and ST when appropriate  Acute toxic metabolic encephalopathy, need for sedation   CARDIAC ICU monitoring  GI GI PROPHYLAXIS as indicated  NUTRITIONAL STATUS Nutrition Status: Nutrition Problem: Moderate Malnutrition Etiology: social / environmental circumstances(inadequate oral intake, EtOH use) Signs/Symptoms: moderate fat depletion, moderate muscle depletion, severe muscle depletion Interventions: Refer to RD note for recommendations   DIET-->as tolerated Constipation protocol as indicated  ENDO - will use ICU hypoglycemic\Hyperglycemia protocol if indicated   ELECTROLYTES -follow labs as needed -replace as needed -pharmacy consultation and following   DVT/GI PRX ordered and assessed TRANSFUSIONS AS NEEDED MONITOR FSBS I Assessed the need for Labs I Assessed the need for Foley I Assessed the need for Central Venous Line I Assessed the need for Mobilization I made an Assessment of medications to be adjusted accordingly Safety Risk assessment completed   CASE DISCUSSED IN MULTIDISCIPLINARY ROUNDS WITH ICU TEAM  Critical Care Time devoted to patient care services described in this note is 32 minutes.   Overall, patient is critically ill, prognosis is guarded.    Lucie Leather, M.D.  [facility]  Medical Director [facility] Medical Director [facility] Cardio-Pulmonary Department

## 2020-02-10 NOTE — Progress Notes (Signed)
Spoke with Carla Dickson in the pharmacy and precedex is compatible with NaCl with 20 KCL.

## 2020-02-11 LAB — CBC WITH DIFFERENTIAL/PLATELET
Abs Immature Granulocytes: 0.08 10*3/uL — ABNORMAL HIGH (ref 0.00–0.07)
Basophils Absolute: 0 10*3/uL (ref 0.0–0.1)
Basophils Relative: 0 %
Eosinophils Absolute: 0 10*3/uL (ref 0.0–0.5)
Eosinophils Relative: 0 %
HCT: 31.7 % — ABNORMAL LOW (ref 36.0–46.0)
Hemoglobin: 10.9 g/dL — ABNORMAL LOW (ref 12.0–15.0)
Immature Granulocytes: 1 %
Lymphocytes Relative: 12 %
Lymphs Abs: 1.5 10*3/uL (ref 0.7–4.0)
MCH: 33.1 pg (ref 26.0–34.0)
MCHC: 34.4 g/dL (ref 30.0–36.0)
MCV: 96.4 fL (ref 80.0–100.0)
Monocytes Absolute: 2.3 10*3/uL — ABNORMAL HIGH (ref 0.1–1.0)
Monocytes Relative: 19 %
Neutro Abs: 8.2 10*3/uL — ABNORMAL HIGH (ref 1.7–7.7)
Neutrophils Relative %: 68 %
Platelets: 152 10*3/uL (ref 150–400)
RBC: 3.29 MIL/uL — ABNORMAL LOW (ref 3.87–5.11)
RDW: 15.9 % — ABNORMAL HIGH (ref 11.5–15.5)
WBC: 12.1 10*3/uL — ABNORMAL HIGH (ref 4.0–10.5)
nRBC: 0 % (ref 0.0–0.2)

## 2020-02-11 LAB — BASIC METABOLIC PANEL
Anion gap: 8 (ref 5–15)
BUN: 12 mg/dL (ref 6–20)
CO2: 24 mmol/L (ref 22–32)
Calcium: 8.6 mg/dL — ABNORMAL LOW (ref 8.9–10.3)
Chloride: 103 mmol/L (ref 98–111)
Creatinine, Ser: 0.45 mg/dL (ref 0.44–1.00)
GFR calc Af Amer: >60 mL/min (ref >60)
GFR calc non Af Amer: >60 mL/min (ref >60)
Glucose, Bld: 83 mg/dL (ref 70–99)
Potassium: 3.5 mmol/L (ref 3.5–5.1)
Sodium: 135 mmol/L (ref 135–145)

## 2020-02-11 LAB — URINE CULTURE: Culture: <10000 — AB

## 2020-02-11 LAB — MAGNESIUM: Magnesium: 2 mg/dL (ref 1.7–2.4)

## 2020-02-11 LAB — PHOSPHORUS: Phosphorus: 3.4 mg/dL (ref 2.5–4.6)

## 2020-02-11 MED ORDER — MAGNESIUM SULFATE 4 GM/100ML IV SOLN
4.0000 g | Freq: Once | INTRAVENOUS | Status: AC
  Administered 2020-02-11: 4 g via INTRAVENOUS
  Filled 2020-02-11: qty 100

## 2020-02-11 MED ORDER — ADULT MULTIVITAMIN W/MINERALS CH
1.0000 | ORAL_TABLET | Freq: Every day | ORAL | Status: DC
  Administered 2020-02-11 – 2020-02-15 (×5): 1 via ORAL
  Filled 2020-02-11 (×5): qty 1

## 2020-02-11 MED ORDER — PANTOPRAZOLE SODIUM 40 MG PO TBEC
40.0000 mg | DELAYED_RELEASE_TABLET | Freq: Every day | ORAL | Status: DC
  Administered 2020-02-11 – 2020-02-15 (×5): 40 mg via ORAL
  Filled 2020-02-11 (×5): qty 1

## 2020-02-11 MED ORDER — PHENOBARBITAL 32.4 MG PO TABS
64.8000 mg | ORAL_TABLET | Freq: Once | ORAL | Status: AC
  Administered 2020-02-11: 64.8 mg via ORAL
  Filled 2020-02-11: qty 2

## 2020-02-11 MED ORDER — CLONIDINE HCL 0.1 MG PO TABS
0.2000 mg | ORAL_TABLET | Freq: Three times a day (TID) | ORAL | Status: DC
  Administered 2020-02-11 – 2020-02-15 (×12): 0.2 mg via ORAL
  Filled 2020-02-11 (×12): qty 2

## 2020-02-11 MED ORDER — DIVALPROEX SODIUM 125 MG PO CSDR
250.0000 mg | DELAYED_RELEASE_CAPSULE | Freq: Three times a day (TID) | ORAL | Status: DC
  Administered 2020-02-11 – 2020-02-15 (×12): 250 mg via ORAL
  Filled 2020-02-11 (×16): qty 2

## 2020-02-11 MED ORDER — POTASSIUM CHLORIDE IN NACL 40-0.9 MEQ/L-% IV SOLN
INTRAVENOUS | Status: DC
  Administered 2020-02-11 – 2020-02-14 (×7): 75 mL/h via INTRAVENOUS
  Filled 2020-02-11 (×9): qty 1000

## 2020-02-11 MED ORDER — FOLIC ACID 1 MG PO TABS
1.0000 mg | ORAL_TABLET | Freq: Every day | ORAL | Status: DC
  Administered 2020-02-11 – 2020-02-15 (×5): 1 mg via ORAL
  Filled 2020-02-11 (×5): qty 1

## 2020-02-11 MED ORDER — PHENOBARBITAL 32.4 MG PO TABS
32.4000 mg | ORAL_TABLET | Freq: Two times a day (BID) | ORAL | Status: AC
  Administered 2020-02-12 – 2020-02-13 (×4): 32.4 mg via ORAL
  Filled 2020-02-11 (×4): qty 1

## 2020-02-11 MED ORDER — LEVETIRACETAM 500 MG PO TABS
500.0000 mg | ORAL_TABLET | Freq: Two times a day (BID) | ORAL | Status: DC
  Administered 2020-02-11 – 2020-02-15 (×9): 500 mg via ORAL
  Filled 2020-02-11 (×11): qty 1

## 2020-02-11 NOTE — Progress Notes (Signed)
Pharmacy Electrolyte Monitoring Consult:   Labs:  Sodium (mmol/L)  Date Value  02/11/2020 135  10/22/2014 139   Potassium (mmol/L)  Date Value  02/11/2020 3.5  10/22/2014 3.3 (L)   Magnesium (mg/dL)  Date Value  41/71/2787 2.0   Phosphorus (mg/dL)  Date Value  18/36/7255 3.4   Calcium (mg/dL)  Date Value  00/16/4290 8.6 (L)   Calcium, Total (mg/dL)  Date Value  37/95/5831 8.6   Albumin (g/dL)  Date Value  67/42/5525 3.1 (L)  10/22/2014 4.0   Corrected Ca: 9.3 mg/dL  Assessment: 89U.o. female admitted to ICU on 02/03/2020 for seizures. Pharmacy consulted to assist in monitoring and replacing electrolytes. Patient is having improved PO intake at this time.  Goals of Therapy:  Will treat to potassium ~4 and magnesium ~2.  All other electrolytes WNL.  Plan: Magnesium sulfate 4 g IV x 1 ordered by NP this AM.  Currently on NS with KCl 20 mEq/L at 75 mL/hr.  Will advance potassium content in IVF and increase to NS with KCl 40 mEq/L at 75 mL/hr.  Will follow electrolytes with AM labs, and adjust per consult.  Cherly Hensen, PharmD 02/11/2020 11:58 AM

## 2020-02-11 NOTE — Progress Notes (Signed)
Pick up tomorrow by [facility] @ 7 am. Got sign out by Dr Karna Christmas - alcohol withdrawal.

## 2020-02-11 NOTE — Progress Notes (Signed)
PHARMACIST - PHYSICIAN COMMUNICATION  CONCERNING: IV to Oral Route Change Policy  RECOMMENDATION: This patient is receiving folic acid, pantoprazole, and thiamine by the intravenous route.  Based on criteria approved by the Pharmacy and Therapeutics Committee, the intravenous medication(s) is/are being converted to the equivalent oral dose form(s).   DESCRIPTION: These criteria include:  The patient is eating (either orally or via tube) and/or has been taking other orally administered medications for a least 24 hours  The patient has no evidence of active gastrointestinal bleeding or impaired GI absorption (gastrectomy, short bowel, patient on TNA or NPO).  If you have questions about this conversion, please contact the Pharmacy Department at [phone].  Hason Ofarrell L, [facility] 02/11/2020 9:41 AM

## 2020-02-11 NOTE — Progress Notes (Signed)
Daily Progress Note   Patient Name: Carla Dickson       Date: 02/11/2020 DOB: 09-11-1971  Age: 49 y.o. MRN#: [id number] Attending Physician: Flora Lipps, MD Primary Care Physician: Patient, No Pcp Per Admit Date: 02/03/2020  Reason for Consultation/Follow-up: Establishing goals of care  Subjective: Patient wakes to voice. Oriented to name, but otherwise disoriented. Thinks she is at home. Reoriented her to place/situation. Drowsy but recently given IV morphine for pain.  GOC:  Care plan discussed with RN, Dr Lanney Gins and multidisciplinary team.  No family at bedside during visit. Spoke with father, Carla Dickson, via telephone to provide update. Carla Dickson spoke with PMT NP Quinn Axe multiple times last week. Updated Carla Dickson on events since this weekend including diagnoses, interventions, plan of care. Explained medication adjustments today with hopes of discontinuing precedex gtt. Also pending PT/OT and psych consultation. When Carla Dickson and the patient's brother visited bedside this weekend, Carla Dickson was awake, smiling, and accepting sips of liquids for them. Encouraged ongoing family visits for encouragement and assistance with meals, as we are still worried about her oral intake.   Briefly discussed plan moving forward and that Carla Dickson cannot return home alone. Discussed PT consult and if she will participate, they may feel she is a candidate for SNF rehab. Father agrees and also shares that if Carla Dickson stabilizes, the family will ensure she is not living alone. We discussed need for sobriety moving forward.   Reassured of ongoing support this admission.   Length of Stay: 8  Current Medications: Scheduled Meds:  . aspirin EC  81 mg Oral Daily  . atorvastatin  40 mg Oral Daily  .  Chlorhexidine Gluconate Cloth  6 each Topical Daily  . cloNIDine  0.2 mg Oral TID  . divalproex  250 mg Oral Q8H  . enoxaparin (LOVENOX) injection  40 mg Subcutaneous Q24H  . feeding supplement (ENSURE ENLIVE)  237 mL Oral TID BM  . folic acid  1 mg Oral Daily  . levETIRAcetam  500 mg Oral BID  . multivitamin with minerals  1 tablet Oral Daily  . pantoprazole  40 mg Oral Daily  . phenobarbital  64.8 mg Oral Once   Followed by  . [START ON 02/12/2020] phenobarbital  32.4 mg Oral BID  . thiamine  100 mg Oral Daily  Continuous Infusions: . sodium chloride Stopped (02/10/20 0008)  . 0.9 % NaCl with KCl 40 mEq / L 75 mL/hr at 02/11/20 1449    PRN Meds: sodium chloride, albuterol, docusate sodium, LORazepam, ondansetron (ZOFRAN) IV, polyethylene glycol, sodium chloride flush  Physical Exam Vitals and nursing note reviewed.  Constitutional:      Appearance: She is ill-appearing.  HENT:     Head: Normocephalic and atraumatic.  Cardiovascular:     Rate and Rhythm: Normal rate.  Pulmonary:     Effort: No tachypnea, accessory muscle usage or respiratory distress.  Neurological:     Mental Status: She is alert and easily aroused.     Comments: disoriented  Psychiatric:        Mood and Affect: Affect is flat.        Speech: Speech is delayed.            Vital Signs: BP (!) 144/100   Pulse (!) 109   Temp 98.4 F (36.9 C) (Oral)   Resp 11   Ht 5\' 4"  (1.626 m)   Wt 61.6 kg   SpO2 100%   BMI 23.31 kg/m  SpO2: SpO2: 100 % O2 Device: O2 Device: Room Air O2 Flow Rate: O2 Flow Rate (L/min): 0 L/min  Intake/output summary:   Intake/Output Summary (Last 24 hours) at 02/11/2020 1515 Last data filed at 02/11/2020 1449 Gross per 24 hour  Intake 1951.5 ml  Output 2320 ml  Net -368.5 ml   LBM: Last BM Date: 02/05/20 Baseline Weight: Weight: 68 kg Most recent weight: Weight: 61.6 kg       Palliative Assessment/Data: PPS 30%   Flowsheet Rows     Most Recent Value  Intake  Tab  Referral Department  Hospitalist  Unit at Time of Referral  ICU  Palliative Care Primary Diagnosis  Neurology  Date Notified  02/05/20  Palliative Care Type  New Palliative care  Reason for referral  Clarify Goals of Care  Date of Admission  02/03/20  Date first seen by Palliative Care  02/05/20  # of days Palliative referral response time  0 Day(s)  # of days IP prior to Palliative referral  2  Clinical Assessment  Palliative Performance Scale Score  30%  Pain Max last 24 hours  Not able to report  Pain Min Last 24 hours  Not able to report  Dyspnea Max Last 24 Hours  Not able to report  Dyspnea Min Last 24 hours  Not able to report  Psychosocial & Spiritual Assessment  Palliative Care Outcomes      Patient Active Problem List   Diagnosis Date Noted  . Malnutrition of moderate degree 02/08/2020  . Encounter for hospice care discussion   . Seizure disorder (HCC)   . Goals of care, counseling/discussion   . Palliative care by specialist   . DNR (do not resuscitate) discussion   . Acute encephalopathy 02/03/2020  . Alcohol withdrawal syndrome, with delirium (HCC)   . Encephalopathy 10/04/2019  . GERD (gastroesophageal reflux disease) 01/28/2019  . HTN (hypertension) 01/28/2019  . HLD (hyperlipidemia) 01/28/2019  . Alcohol use 01/28/2019  . Fall 01/28/2019  . SIRS (systemic inflammatory response syndrome) (HCC) 01/28/2019    Palliative Care Assessment & Plan   Patient Profile: 49 y.o. 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   Assessment: Severe acute toxic metabolic encephalopathy Acute CVA ETOH abuse  with severe DT's Hx of seizures Encephalomalacia and significant atrophy on CT scan   Recommendations/Plan:  Continue current plan of care and medical management. Continue to treat the treatable.  Slight clinical improvement. Discussed with multidisciplinary team. Medication adjustments per PCCM. May transfer out of ICU if stable and bed available today.  Pending PT/OT consultations.  Pending psych consultation.  Encouraged family visits with assistance and encouragement during meals.   PMT will follow.  Code Status:  DNR   Code Status Orders  (From admission, onward)         Start     Ordered   02/06/20 1417  Do not attempt resuscitation (DNR)  Continuous    Question Answer Comment  In the event of cardiac or respiratory ARREST Do not call a "code blue"   In the event of cardiac or respiratory ARREST Do not perform Intubation, CPR, defibrillation or ACLS   In the event of cardiac or respiratory ARREST Use medication by any route, position, wound care, and other measures to relive pain and suffering. May use oxygen, suction and manual treatment of airway obstruction as needed for comfort.      02/06/20 1416        Code Status History    Date Active Date Inactive Code Status Order ID Comments User Context   02/03/2020 1629 02/06/2020 1416 Full Code [id number]  Salena Saner, MD ED   10/04/2019 1455 10/09/2019 1625 Full Code [id number]  Erin Fulling, MD ED   01/28/2019 2307 02/01/2019 1841 Full Code [id number]  Oralia Manis, MD Inpatient   Advance Care Planning Activity       Prognosis:   Unable to determine  Discharge Planning:  To Be Determined  Care plan was discussed with RN, multidisciplinary rounds including Dr. Karna Christmas, father [name])  Thank you for allowing the Palliative Medicine Team to assist in the care of this patient.   Time In: 1030- 1235- Time Out: 1045 1250 Total Time 30 Prolonged Time Billed  no      Greater than 50%  of this time was spent  counseling and coordinating care related to the above assessment and plan.  Vennie Homans, DNP, FNP-C Palliative Medicine Team  Phone: [phone] Fax: [phone]  Please contact Palliative Medicine Team phone at [phone] for questions and concerns.

## 2020-02-11 NOTE — Progress Notes (Signed)
CRITICAL CARE NOTE  49 year old womanthe as noted below including seizure disorder and alcohol abuse who presented via EMS after she was found on the floor at home unresponsive.  The patient is noted to have abrasions and laceration on the right cheek and a hematoma on the right side of her face.  She has ecchymoses under her left eye. It was presumed she had a fall. Patient's family was present when EMS presented to the home.  previous seizures from alcohol withdrawal previously Patient is no seizure medications.   ER COURSE has had E. coli UTI previously.  She had a similar presentation in January of this year.  She was admitted by our group in similar circumstance  She is apparently not taking any medications at home.  Work-up in the emergency room has shown laboratory data as below. CT scan of the head shows a possible new focus of indeterminate age infarct is low ganglia.  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     SIGNIFICANT EVENTS 5/9admitted for severe acute ETOH encephalopathy CT head c/w possible new infarct 5/10-5/13 on precedex 5/13 patient now DNR/DNI 5/14 severe agitation 5/15 agitation slowly improving, but remains on precedex 5/16 - patient is improved, she is off IV precedex and is able to take PO meds. She does not require ICU and may be transferred to medical floor.   CC  follow up DT's  SUBJECTIVE Patient remains critically ill Prognosis is guarded On precedex High risk for aspiration   BP (!) 144/100   Pulse (!) 109   Temp 98.4 F (36.9 C) (Oral)   Resp 11   Ht 5\' 4"  (1.626 m)   Wt 61.6 kg   SpO2 100%   BMI 23.31 kg/m    I/O last 3 completed shifts: In: 3885.3 [P.O.:298; I.V.:2230.7; IV Piggyback:1356.7] Out: 1540 [Urine:1540] Total I/O In: 581.9 [P.O.:240; I.V.:341.9] Out: 1500 [Urine:1500]  SpO2: 100 % O2 Flow Rate (L/min): 0 L/min FiO2 (%):  21 %  Estimated body mass index is 23.31 kg/m as calculated from the following:   Height as of this encounter: 5\' 4"  (1.626 m).   Weight as of this encounter: 61.6 kg.  SIGNIFICANT EVENTS   REVIEW OF SYSTEMS  PATIENT IS UNABLE TO PROVIDE COMPLETE REVIEW OF SYSTEMS DUE TO SEVERE DT's      PHYSICAL EXAMINATION:  GENERAL:critically ill appearing,  HEAD: Normocephalic, atraumatic.  EYES: Pupils equal, round, reactive to light.  No scleral icterus.  MOUTH: Moist mucosal membrane. NECK: Supple.  PULMONARY: +rhonchi,  CARDIOVASCULAR: S1 and S2. Regular rate and rhythm. No murmurs, rubs, or gallops.  GASTROINTESTINAL: Soft, nontender, -distended.  Positive bowel sounds.   MUSCULOSKELETAL: No swelling, clubbing, or edema.  NEUROLOGIC: obtunded SKIN:intact,warm,dry  MEDICATIONS: I have reviewed all medications and confirmed regimen as documented   CULTURE RESULTS   Recent Results (from the past 240 hour(s))  Urine culture     Status: Abnormal   Collection Time: 02/03/20  2:29 PM   Specimen: Urine, Catheterized  Result Value Ref Range Status   Specimen Description   Final    URINE, CATHETERIZED Performed at [facility], [address], [place], [address] [place]    Special Requests   Final    NONE Performed at [facility], [address] [place], [address] [place]    Culture MULTIPLE SPECIES PRESENT, SUGGEST RECOLLECTION (A)  Final   Report Status 02/04/2020 FINAL  Final  Culture, blood (routine  x 2)     Status: None   Collection Time: 02/03/20  3:09 PM   Specimen: BLOOD  Result Value Ref Range Status   Specimen Description BLOOD R HAND  Final   Special Requests   Final    BOTTLES DRAWN AEROBIC AND ANAEROBIC Blood Culture results may not be optimal due to an inadequate volume of blood received in culture bottles   Culture   Final    NO GROWTH 5 DAYS Performed at [facility], [address] [place], [place] [address]    Report  Status 02/08/2020 FINAL  Final  Culture, blood (routine x 2)     Status: None   Collection Time: 02/03/20  3:09 PM   Specimen: BLOOD  Result Value Ref Range Status   Specimen Description BLOOD L H  Final   Special Requests   Final    BOTTLES DRAWN AEROBIC AND ANAEROBIC Blood Culture adequate volume   Culture   Final    NO GROWTH 5 DAYS Performed at [facility], [address] [place], [place] [address]    Report Status 02/08/2020 FINAL  Final  Respiratory Panel by RT PCR (Flu A&B, Covid) - Nasopharyngeal Swab     Status: None   Collection Time: 02/03/20  3:34 PM   Specimen: Nasopharyngeal Swab  Result Value Ref Range Status   SARS Coronavirus 2 by RT PCR NEGATIVE NEGATIVE Final    Comment: (NOTE) SARS-CoV-2 target nucleic acids are NOT DETECTED. The SARS-CoV-2 RNA is generally detectable in upper respiratoy specimens during the acute phase of infection. The lowest concentration of SARS-CoV-2 viral copies this assay can detect is 131 copies/mL. A negative result does not preclude SARS-Cov-2 infection and should not be used as the sole basis for treatment or other patient management decisions. A negative result may occur with  improper specimen collection/handling, submission of specimen other than nasopharyngeal swab, presence of viral mutation(s) within the areas targeted by this assay, and inadequate number of viral copies (<131 copies/mL). A negative result must be combined with clinical observations, patient history, and epidemiological information. The expected result is Negative. Fact Sheet for Patients:  https://www.moore.com/ Fact Sheet for Healthcare Providers:  https://www.young.biz/ This test is not yet ap proved or cleared by the [place] FDA and  has been authorized for detection and/or diagnosis of SARS-CoV-2 by FDA under an Emergency Use Authorization (EUA). This EUA will remain  in effect (meaning this test  can be used) for the duration of the COVID-19 declaration under Section 564(b)(1) of the Act, 21 U.S.C. section 360bbb-3(b)(1), unless the authorization is terminated or revoked sooner.    Influenza A by PCR NEGATIVE NEGATIVE Final   Influenza B by PCR NEGATIVE NEGATIVE Final    Comment: (NOTE) The Xpert Xpress SARS-CoV-2/FLU/RSV assay is intended as an aid in  the diagnosis of influenza from Nasopharyngeal swab specimens and  should not be used as a sole basis for treatment. Nasal washings and  aspirates are unacceptable for Xpert Xpress SARS-CoV-2/FLU/RSV  testing. Fact Sheet for Patients: https://www.moore.com/ Fact Sheet for Healthcare Providers: https://www.young.biz/ This test is not yet approved or cleared by the [place] FDA and  has been authorized for detection and/or diagnosis of SARS-CoV-2 by  FDA under an Emergency Use Authorization (EUA). This EUA will remain  in effect (meaning this test can be used) for the duration of the  Covid-19 declaration under Section 564(b)(1) of the Act, 21  U.S.C. section 360bbb-3(b)(1), unless the authorization is  terminated  or revoked. Performed at [facility], [address] [place], [place] [address]   MRSA PCR Screening     Status: Abnormal   Collection Time: 02/03/20  5:15 PM   Specimen: Nasopharyngeal  Result Value Ref Range Status   MRSA by PCR POSITIVE (A) NEGATIVE Final    Comment:        The GeneXpert MRSA Assay (FDA approved for NASAL specimens only), is one component of a comprehensive MRSA colonization surveillance program. It is not intended to diagnose MRSA infection nor to guide or monitor treatment for MRSA infections. RESULT CALLED TO, READ BACK BY AND VERIFIED WITH: SARA MOORE ON 02/03/2020 AT 1851 TIK Performed at [facility], [address] [place], [place] [address]   Urine Culture     Status: Abnormal   Collection Time: 02/09/20  8:53 PM    Specimen: Urine, Random  Result Value Ref Range Status   Specimen Description   Final    URINE, RANDOM Performed at [facility], [address], [place], [place] [address]    Special Requests   Final    NONE Performed at [facility], [address], [place], [place] [address]    Culture (A)  Final    <10,000 COLONIES/mL INSIGNIFICANT GROWTH Performed at [facility] [address] [place], [place] [address]    Report Status 02/11/2020 FINAL  Final          IMAGING    No results found.   Nutrition Status: Nutrition Problem: Moderate Malnutrition Etiology: social / environmental circumstances(inadequate oral intake, EtOH use) Signs/Symptoms: moderate fat depletion, moderate muscle depletion, severe muscle depletion Interventions: Refer to RD note for recommendations    ASSESSMENT AND PLAN SYNOPSIS  SEVERE ACUTE TOXIC METABOLIC ENCEPHALOPATHYDUEtoDT'S AND SEIZURES DUE TO ACUTE STROKES   ETOH abuse with withdrawal -needs ICU monitoring -improved 02/11/20  ALCOHOL WITHDRAWAL syndrome -Therapy with Thiamine and MVI -CIWA Protocol -Precedex as needed -high risk for aspiration    NEUROLOGY Acute CVA Needs PT/OT and ST when appropriate  Acute toxic metabolic encephalopathy, need for sedation   CARDIAC ICU monitoring  GI GI PROPHYLAXIS as indicated  NUTRITIONAL STATUS Nutrition Status: Nutrition Problem: Moderate Malnutrition Etiology: social / environmental circumstances(inadequate oral intake, EtOH use) Signs/Symptoms: moderate fat depletion, moderate muscle depletion, severe muscle depletion Interventions: Refer to RD note for recommendations   DIET-->as tolerated Constipation protocol as indicated  ENDO - will use ICU hypoglycemic\Hyperglycemia protocol if indicated   ELECTROLYTES -follow labs as needed -replace as needed -pharmacy consultation and following   DVT/GI PRX ordered and assessed TRANSFUSIONS  AS NEEDED MONITOR FSBS I Assessed the need for Labs I Assessed the need for Foley I Assessed the need for Central Venous Line I Assessed the need for Mobilization I made an Assessment of medications to be adjusted accordingly Safety Risk assessment completed   CASE DISCUSSED IN MULTIDISCIPLINARY ROUNDS WITH ICU TEAM  Critical Care Time devoted to patient care services described in this note is 32 minutes.   Overall, patient is critically ill, prognosis is guarded.     Ottie Glazier, M.D.  Pulmonary & [place]

## 2020-02-11 NOTE — Progress Notes (Signed)
End of Shift Summary:   Patient was teary at the start of the shift stating that she was ready to go home and get out of the hospital.  She agreed to taking some ativan to help with her anxiety.  That was effective for relief.  She then was complaining of severe back pain from how she slept so she requested morphine x 2.  She slept through breakfast.  She ate small amounts of her lunch and dinner.  This shift we discontinued her precedex drip and started her on clonidine, keppra and depakote.  Patient tolerated taking these pills well.  The larger pills I crushed and put in applesauce as she still has some slurring in her speech.  Patient continues to make adequate urine and be incontinent.  Psych, PT and OT were consulted this day.

## 2020-02-12 LAB — CBC WITH DIFFERENTIAL/PLATELET
Abs Immature Granulocytes: 0.05 10*3/uL (ref 0.00–0.07)
Basophils Absolute: 0 10*3/uL (ref 0.0–0.1)
Basophils Relative: 0 %
Eosinophils Absolute: 0.1 10*3/uL (ref 0.0–0.5)
Eosinophils Relative: 0 %
HCT: 32.3 % — ABNORMAL LOW (ref 36.0–46.0)
Hemoglobin: 11.7 g/dL — ABNORMAL LOW (ref 12.0–15.0)
Immature Granulocytes: 0 %
Lymphocytes Relative: 13 %
Lymphs Abs: 1.5 10*3/uL (ref 0.7–4.0)
MCH: 32.7 pg (ref 26.0–34.0)
MCHC: 36.2 g/dL — ABNORMAL HIGH (ref 30.0–36.0)
MCV: 90.2 fL (ref 80.0–100.0)
Monocytes Absolute: 1.4 10*3/uL — ABNORMAL HIGH (ref 0.1–1.0)
Monocytes Relative: 12 %
Neutro Abs: 8.6 10*3/uL — ABNORMAL HIGH (ref 1.7–7.7)
Neutrophils Relative %: 75 %
Platelets: 252 10*3/uL (ref 150–400)
RBC: 3.58 MIL/uL — ABNORMAL LOW (ref 3.87–5.11)
RDW: 15.9 % — ABNORMAL HIGH (ref 11.5–15.5)
WBC: 11.6 10*3/uL — ABNORMAL HIGH (ref 4.0–10.5)
nRBC: 0 % (ref 0.0–0.2)

## 2020-02-12 LAB — BASIC METABOLIC PANEL
Anion gap: 10 (ref 5–15)
BUN: 8 mg/dL (ref 6–20)
CO2: 24 mmol/L (ref 22–32)
Calcium: 9 mg/dL (ref 8.9–10.3)
Chloride: 98 mmol/L (ref 98–111)
Creatinine, Ser: 0.44 mg/dL (ref 0.44–1.00)
GFR calc Af Amer: >60 mL/min (ref >60)
GFR calc non Af Amer: >60 mL/min (ref >60)
Glucose, Bld: 114 mg/dL — ABNORMAL HIGH (ref 70–99)
Potassium: 3.5 mmol/L (ref 3.5–5.1)
Sodium: 132 mmol/L — ABNORMAL LOW (ref 135–145)

## 2020-02-12 LAB — MAGNESIUM: Magnesium: 1.7 mg/dL (ref 1.7–2.4)

## 2020-02-12 LAB — PHOSPHORUS: Phosphorus: 3.3 mg/dL (ref 2.5–4.6)

## 2020-02-12 MED ORDER — ACETAMINOPHEN 325 MG PO TABS
650.0000 mg | ORAL_TABLET | Freq: Four times a day (QID) | ORAL | Status: DC | PRN
  Administered 2020-02-12 – 2020-02-14 (×7): 650 mg via ORAL
  Filled 2020-02-12 (×7): qty 2

## 2020-02-12 MED ORDER — LABETALOL HCL 5 MG/ML IV SOLN
5.0000 mg | Freq: Once | INTRAVENOUS | Status: AC
  Administered 2020-02-12: 5 mg via INTRAVENOUS
  Filled 2020-02-12: qty 4

## 2020-02-12 MED ORDER — MAGNESIUM SULFATE 2 GM/50ML IV SOLN
2.0000 g | Freq: Once | INTRAVENOUS | Status: AC
  Administered 2020-02-12: 2 g via INTRAVENOUS
  Filled 2020-02-12: qty 50

## 2020-02-12 NOTE — Progress Notes (Signed)
Pharmacy Electrolyte Monitoring Consult:   Labs:  Sodium (mmol/L)  Date Value  02/12/2020 132 (L)  10/22/2014 139   Potassium (mmol/L)  Date Value  02/12/2020 3.5  10/22/2014 3.3 (L)   Magnesium (mg/dL)  Date Value  48/62/8241 1.7   Phosphorus (mg/dL)  Date Value  75/30/1040 3.3   Calcium (mg/dL)  Date Value  45/91/3685 9.0   Calcium, Total (mg/dL)  Date Value  99/23/4144 8.6   Albumin (g/dL)  Date Value  36/09/6578 3.1 (L)  10/22/2014 4.0   Corrected Ca: 9.3 mg/dL  Assessment: 6J.o. female admitted to ICU on 02/03/2020 for seizures. Pharmacy consulted to assist in monitoring and replacing electrolytes. Patient is having improved PO intake at this time.  Goals of Therapy:  Will treat to potassium ~4 and magnesium ~2.  All other electrolytes WNL.  Plan: Currently on NS with KCl 40 mEq/L at 75 mL/hr. Will order Magnesium 2g IV x 1 dose.  Will follow electrolytes with AM labs, and adjust per consult.  Gardner Candle, PharmD, BCPS Clinical Pharmacist 02/12/2020 8:09 AM

## 2020-02-12 NOTE — Progress Notes (Signed)
[place] [facility] [place] NAME: Carla Dickson    MR#:  [id number]  DATE OF BIRTH:  September 24, 1971  SUBJECTIVE:  CHIEF COMPLAINT:   Chief Complaint  Patient presents with  . Seizures  altered, mother at bedside REVIEW OF SYSTEMS:  ROSunable to obtain due to her current mental state DRUG ALLERGIES:  No Known Allergies VITALS:  Blood pressure (!) 147/90, pulse (!) 102, temperature 98.7 F (37.1 C), temperature source Oral, resp. rate 16, height 5\' 4"  (1.626 m), weight 61.7 kg, SpO2 95 %. PHYSICAL EXAMINATION:  Physical Exam Constitutional:      Appearance: She is ill-appearing.  HENT:     Head: Normocephalic and atraumatic.  Eyes:     Conjunctiva/sclera: Conjunctivae normal.     Pupils: Pupils are equal, round, and reactive to light.  Neck:     Thyroid: No thyromegaly.     Trachea: No tracheal deviation.  Cardiovascular:     Rate and Rhythm: Normal rate and regular rhythm.     Heart sounds: Normal heart sounds.  Pulmonary:     Effort: Pulmonary effort is normal. No respiratory distress.     Breath sounds: Normal breath sounds. No wheezing.  Chest:     Chest wall: No tenderness.  Abdominal:     General: Bowel sounds are normal. There is no distension.     Palpations: Abdomen is soft.     Tenderness: There is no abdominal tenderness.  Musculoskeletal:        General: Normal range of motion.     Cervical back: Normal range of motion and neck supple.  Skin:    General: Skin is warm and dry.     Findings: No rash.  Neurological:     Mental Status: She is lethargic and confused.     Cranial Nerves: No cranial nerve deficit.    LABORATORY PANEL:  Female CBC Recent Labs  Lab 02/12/20 0449  WBC 11.6*  HGB 11.7*  HCT 32.3*  PLT 252   ------------------------------------------------------------------------------------------------------------------ Chemistries  Recent Labs  Lab 02/06/20 0350 02/07/20 0415 02/12/20 0449  NA 135   < > 132*  K  3.4*   < > 3.5  CL 99   < > 98  CO2 28   < > 24  GLUCOSE 119*   < > 114*  BUN 10   < > 8  CREATININE 0.55   < > 0.44  CALCIUM 8.8*   < > 9.0  MG 1.6*   < > 1.7  AST 378*  --   --   ALT 164*  --   --   ALKPHOS 188*  --   --   BILITOT 4.4*  --   --    < > = values in this interval not displayed.   RADIOLOGY:  No results found. ASSESSMENT AND PLAN:  49 year old f with k/h/o seizure disorder and alcohol abuse admitted for being unresponsive at home.  The patient was noted to have abrasions and laceration on the right cheek, hematoma on the right side of her face, ecchymoses under her left eye. It was presumed she had a fall at home.   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   H/o seizure - continue phenobarbital   SIGNIFICANT EVENTS since admission 5/9admitted for severe acute ETOH encephalopathy CT head c/w possible new infarct 5/10-5/13 on precedex 5/13 patient now DNR/DNI 5/14 severe agitation 5/15 agitation slowly improving, but remains on precedex 5/16 patient is improved, she is off IV precedex and is able to take PO meds.  5/17 requested transfer to floor & TRH  PT/OT recommends SNF - TOC working on it  Status is: Inpatient  Remains inpatient appropriate because:Altered mental status   Dispo: The patient is from: Home              Anticipated d/c is to: SNF              Anticipated d/c date is: 3 days              Patient currently is not medically stable to d/c.       DVT prophylaxis: Lovenox Family Communication: Patient's mother at bedside   All the records are reviewed and case discussed with Care Management/Social Worker. Management plans discussed with the patient, family and they are in agreement.  CODE STATUS: DNR  TOTAL TIME TAKING CARE OF THIS PATIENT: 35 minutes.   More than 50% of the time was spent in counseling/coordination of care: YES  POSSIBLE D/C IN 1-2 DAYS, DEPENDING ON CLINICAL CONDITION.   Delfino Lovett M.D on 02/12/2020 at 8:09 PM  [facility]   CC: Primary care physician; Patient, No Pcp Per  Note: This dictation was prepared with Dragon dictation along with smaller phrase technology. Any transcriptional errors that result from this process are unintentional.

## 2020-02-12 NOTE — Evaluation (Signed)
Physical Therapy Evaluation Patient Details Name: Carla Dickson MRN: [id number] DOB: Oct 21, 1970 Today's Date: 02/12/2020   History of Present Illness  Pt admitted for acute encephalopathy with complaints of unresponsive event at home with fall. 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 Hospital course has been complicated by agitation, lethargy and was requiring a precedex drip in CCU. Due to inability to work with therapy, inital consult discontinued. New consult received and evaluation completed this date.  Clinical Impression  Pt is a pleasant 49 year old female who was admitted for acute encephalopathy. Pt performs bed mobility with max assist and unable to maintain sitting balance in order to progress to transfers/ambulation. Pt demonstrates deficits with strength/cognition/balance/endurance. Reports B hands/feet tingling, however no numbness. Coordination appears grossly intact, however all movements are very slow and effortful due to weakness. Pt is a poor historian and becomes very emotional when talking about her husband who recently passed away. CUrrently is not safe or able to manage at home. Would benefit from skilled PT to address above deficits and promote optimal return to PLOF; recommend transition to STR upon discharge from acute hospitalization.     Follow Up Recommendations SNF    Equipment Recommendations  (TBD at next venue)    Recommendations for Other Services       Precautions / Restrictions Precautions Precautions: ICD/Pacemaker Restrictions Weight Bearing Restrictions: No      Mobility  Bed Mobility Overal bed mobility: Needs Assistance Bed Mobility: Supine to Sit     Supine to sit: Max assist     General bed mobility comments: able to come to EOB, however very unsteady, unable to maintain seated balance, losing in post lateral directions. Limited self correcting nature. Only able to maintain briefly prior to  needing to return back to bed.  Transfers                 General transfer comment: unable to perform due to safety  Ambulation/Gait             General Gait Details: not safe at this time  Stairs            Wheelchair Mobility    Modified Rankin (Stroke Patients Only)       Balance Overall balance assessment: History of Falls;Needs assistance Sitting-balance support: Feet unsupported;Bilateral upper extremity supported Sitting balance-Leahy Scale: Zero                                       Pertinent Vitals/Pain Pain Assessment: Faces Faces Pain Scale: Hurts little more Pain Location: unable to specifically report, however appears low back Pain Descriptors / Indicators: Aching Pain Intervention(s): Limited activity within patient's tolerance    Home Living Family/patient expects to be discharged to:: Private residence Living Arrangements: Alone Available Help at Discharge: Family;Available PRN/intermittently(depends on parents and neighbors for transportation) Type of Home: House Home Access: Stairs to enter Entrance Stairs-Rails: None Entrance Stairs-Number of Steps: 1 Home Layout: One level Home Equipment: None Additional Comments: Pt reports she was previously living with husband who recently passed away, now dependent on family PRN    Prior Function Level of Independence: Needs assistance         Comments: pt is very poor historian secondary to confusion and slurred speech. Pt vague at answering questions in this area. Reports she had difficulty with mobility and reports "it's been a  minute" since I was ambulating well. Unable to report number of falls     Hand Dominance        Extremity/Trunk Assessment   Upper Extremity Assessment Upper Extremity Assessment: Generalized weakness(B UE grossly 3/5)    Lower Extremity Assessment Lower Extremity Assessment: Generalized weakness(B LE grossly 3/5)        Communication   Communication: Expressive difficulties  Cognition Arousal/Alertness: Awake/alert Behavior During Therapy: Flat affect Overall Cognitive Status: Difficult to assess                                 General Comments: alert but thinks she is at home in her living room. Pleasant and follows commands inconsistenly      General Comments      Exercises Other Exercises Other Exercises: Pt found with bed linens soaked from urine. Performed rolling in bed with max assist for linen change and gown changed. Unable to assist in changing gown. Needs max assist for repositioning in bed.   Assessment/Plan    PT Assessment Patient needs continued PT services  PT Problem List Decreased strength;Decreased activity tolerance;Decreased balance;Decreased mobility;Decreased safety awareness;Decreased cognition       PT Treatment Interventions Gait training;Therapeutic activities;Therapeutic exercise;Balance training    PT Goals (Current goals can be found in the Care Plan section)  Acute Rehab PT Goals Patient Stated Goal: to walk again PT Goal Formulation: With patient Time For Goal Achievement: 02/26/20 Potential to Achieve Goals: Good    Frequency Min 2X/week   Barriers to discharge        Co-evaluation               AM-PAC PT "6 Clicks" Mobility  Outcome Measure Help needed turning from your back to your side while in a flat bed without using bedrails?: A Lot Help needed moving from lying on your back to sitting on the side of a flat bed without using bedrails?: A Lot Help needed moving to and from a bed to a chair (including a wheelchair)?: Total Help needed standing up from a chair using your arms (e.g., wheelchair or bedside chair)?: Total Help needed to walk in hospital room?: Total Help needed climbing 3-5 steps with a railing? : Total 6 Click Score: 8    End of Session   Activity Tolerance: Patient tolerated treatment well Patient left: in  bed;with bed alarm set;with family/visitor present Nurse Communication: Mobility status PT Visit Diagnosis: Unsteadiness on feet (R26.81);Muscle weakness (generalized) (M62.81);History of falling (Z91.81);Difficulty in walking, not elsewhere classified (R26.2);Pain Pain - Right/Left: (back) Pain - part of body: (back)    Time: 1025-8527 PT Time Calculation (min) (ACUTE ONLY): 29 min   Charges:   PT Evaluation $PT Eval Moderate Complexity: 1 Mod PT Treatments $Therapeutic Activity: 8-22 mins        Elizabeth Palau, PT, DPT [phone]   Lesslie Mckeehan 02/12/2020, 11:00 AM

## 2020-02-12 NOTE — TOC Progression Note (Addendum)
Transition of Care [facility]) - Progression Note    Patient Details  Name: Carla Dickson MRN: [id number] Date of Birth: [birth year]  [facility] CM/SW Contact  Barrie Dunker, RN Phone Number: 02/12/2020, 4:09 PM  Clinical Narrative:     Received a call back from Tommie Ard from [employer], she stated that she is following her medical care already.  She will continue to follow her and get her applied for Medicaid and Disability        Expected Discharge Plan and Services                                                 Social Determinants of Health (SDOH) Interventions    Readmission Risk Interventions No flowsheet data found.

## 2020-02-12 NOTE — Evaluation (Signed)
Occupational Therapy Evaluation Patient Details Name: Carla Dickson MRN: [id number] DOB: 1971/06/13 Today's Date: 02/12/2020    History of Present Illness Pt admitted for acute encephalopathy with complaints of unresponsive event at home with fall. 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 Hospital course has been complicated by agitation, lethargy and was requiring a precedex drip in CCU. Due to inability to work with therapy, inital consult discontinued. New consult received and evaluation completed this date.   Clinical Impression   Pt was seen for OT evaluation this date. Prior to hospital admission, pt was apparently Indep with ADLs/ADL mobility, but had recently been requiring family assist for IADLs including transportation since her husband passed. Pt lives alone in [facility] with 1 STE, but while mother is present in the room, she does make mention of patient coming to live with her. Currently pt demonstrates impairments as described below (See OT problem list) which functionally limit her ability to perform ADL/self-care tasks. Pt currently requires MIN to MOD A with UB ADLs, MOD to MAX A with seated LB ADLs, MAX A with bed mobility, and TOTAL A with attempts to transfer.  Pt would benefit from skilled OT to address noted impairments and functional limitations (see below for any additional details) in order to maximize safety and independence while minimizing falls risk and caregiver burden. Upon hospital discharge, recommend STR to maximize pt safety and return to PLOF.     Follow Up Recommendations  SNF    Equipment Recommendations  3 in 1 bedside commode;Tub/shower seat    Recommendations for Other Services       Precautions / Restrictions Precautions Precautions: ICD/Pacemaker Restrictions Weight Bearing Restrictions: No      Mobility Bed Mobility Overal bed mobility: Needs Assistance Bed Mobility: Supine to Sit;Sit to Supine      Supine to sit: Max assist;HOB elevated Sit to supine: Max assist   General bed mobility comments: demso L lateral lean while in EOB sitting, requiring UE support, eventually, once feet squared, able to sustain static sit with better stability.  Transfers Overall transfer level: Needs assistance Equipment used: Rolling walker (2 wheeled);1 person hand held assist Transfers: Sit to/from Stand Sit to Stand: Total assist         General transfer comment: attempted to engage pt in transfer with RW initially, but pt unable to engage and requires foot/knee blocking. With arm in arm assist for sit to stand with OT able to block foot and knees, pt able to come to stand, but essentially total A. Pt does appear to make some effort to sustain stand once she is at the top of range.    Balance Overall balance assessment: History of Falls;Needs assistance Sitting-balance support: Bilateral upper extremity supported;Feet supported Sitting balance-Leahy Scale: Poor Sitting balance - Comments: Able to sustain static stand after several minutes, once OT offers bilateral foot support, and pt utilizes L UE support.     Standing balance-Leahy Scale: Zero Standing balance comment: TOTAL A to stand                           ADL either performed or assessed with clinical judgement   ADL Overall ADL's : Needs assistance/impaired Eating/Feeding: Minimal assistance;Bed level   Grooming: Wash/dry face;Minimal assistance;Sitting           Upper Body Dressing : Minimal assistance;Moderate assistance;Sitting   Lower Body Dressing: Maximal assistance;Sitting/lateral leans Lower Body Dressing Details (indicate cue  type and reason): sitting balance F-, requiring UE support, making contributing to threading socks very difficult for pt.   Toilet Transfer Details (indicate cue type and reason): not safe to attempt at this time. Toileting- Clothing Manipulation and Hygiene: Moderate  assistance;Maximal assistance;Bed level               Vision   Additional Comments: difficult to formally assess, pt appears to track functionally throughout session.     Perception     Praxis      Pertinent Vitals/Pain Pain Assessment: 0-10 Pain Score: 9  Faces Pain Scale: Hurts little more Pain Location: after mobility, pt states, "I hurt everywhere" OT has to ask specific questions to get location, pt finally indicates back and stomach Pain Descriptors / Indicators: Aching;Sore Pain Intervention(s): Limited activity within patient's tolerance;Monitored during session;Repositioned;Other (comment)(called nurse, but he reports that pt has recently had tylenol which is what's ordered at this time.)     Hand Dominance Right   Extremity/Trunk Assessment Upper Extremity Assessment Upper Extremity Assessment: Generalized weakness(shld, elbow and wrist MMT 3+/5, breaks with light resistance.)   Lower Extremity Assessment Lower Extremity Assessment: Defer to PT evaluation;Generalized weakness       Communication Communication Communication: Expressive difficulties   Cognition Arousal/Alertness: Awake/alert Behavior During Therapy: Flat affect Overall Cognitive Status: Difficult to assess                                 General Comments: alert, but not oriented. Able to follow one step commands with increased time to process.   General Comments       Exercises Exercises: Other exercises Other Exercises Other Exercises: OT facilitates education re: role of OT and safety considerations-notifying pt to use call bell and notifying pt of bed alarm. Pt with MIN/MOD reception.   Shoulder Instructions      Home Living Family/patient expects to be discharged to:: Private residence Living Arrangements: Alone Available Help at Discharge: Family;Available PRN/intermittently(depends on parents and neighbors for transportation) Type of Home: House Home Access:  Stairs to enter Technical brewer of Steps: 1 Entrance Stairs-Rails: None Home Layout: One level               Home Equipment: None   Additional Comments: Pt reports she was previously living with husband who recently passed away, now dependent on family PRN      Prior Functioning/Environment Level of Independence: Needs assistance        Comments: pt is very poor historian secondary to confusion and slurred speech. Pt vague at answering questions in this area. Reports she had difficulty with mobility and reports "it's been a minute" since I was ambulating well. Unable to report number of falls        OT Problem List: Decreased strength;Decreased activity tolerance;Impaired balance (sitting and/or standing);Decreased safety awareness;Decreased knowledge of use of DME or AE;Pain      OT Treatment/Interventions: Self-care/ADL training;Therapeutic exercise;Energy conservation;DME and/or AE instruction;Therapeutic activities;Patient/family education;Balance training    OT Goals(Current goals can be found in the care plan section) Acute Rehab OT Goals Patient Stated Goal: to walk again OT Goal Formulation: With patient Time For Goal Achievement: 02/26/20 Potential to Achieve Goals: Good  OT Frequency: Min 2X/week   Barriers to D/C:            Co-evaluation              AM-PAC OT "6 Clicks" Daily  Activity     Outcome Measure Help from another person eating meals?: A Little Help from another person taking care of personal grooming?: A Little Help from another person toileting, which includes using toliet, bedpan, or urinal?: Total Help from another person bathing (including washing, rinsing, drying)?: Total Help from another person to put on and taking off regular upper body clothing?: A Lot Help from another person to put on and taking off regular lower body clothing?: A Lot 6 Click Score: 12   End of Session Equipment Utilized During Treatment: Gait  belt;Rolling walker Nurse Communication: Mobility status;Other (comment)(notified RN of pt pain status)  Activity Tolerance: Patient limited by fatigue;Patient limited by pain Patient left: in bed;with call bell/phone within reach;with bed alarm set  OT Visit Diagnosis: Unsteadiness on feet (R26.81);Other abnormalities of gait and mobility (R26.89);Muscle weakness (generalized) (M62.81)                Time: 3419-6222 OT Time Calculation (min): 42 min Charges:  OT General Charges $OT Visit: 1 Visit OT Evaluation $OT Eval Moderate Complexity: 1 Mod OT Treatments $Self Care/Home Management : 8-22 mins $Therapeutic Activity: 8-22 mins  Rejeana Brock, MS, OTR/L ascom [phone] 02/12/20, 2:16 PM

## 2020-02-12 NOTE — Progress Notes (Signed)
At [phone] sent message to NP Webb Silversmith patient blood pressure 177/103, rechecked 173/100 and heart rate 103. NP replied at [phone] PCCM is still managing the patient the hospitalist will start at 7 am. At 432 called RN Debara Pickett to see which MD is on call for there patient's. At [phone] received a call back from RN Tess noted that the NP Anna Genre) will send me the number to the covering NP. Received a message from NP Ouma at [phone] with  NP Amanda Cockayne number. At [phone] called NP Amanda Cockayne received verbal orders for labetalol 10 mg IV once, for the patient high blood pressure.

## 2020-02-12 NOTE — Progress Notes (Signed)
OT Cancellation Note  Patient Details Name: Carla Dickson MRN: [id number] DOB: 1970-10-31   Cancelled Treatment:    Reason Eval/Treat Not Completed: Other (comment)  RN requests OT hold at this time; he just finished passing medication and reports pt had difficulty clearing a vitamin and in addition, is trying to eat some grits (pt had apparently not eaten breakfast yet). Will f/u for OT evaluation at later date/time as able. Thank you.  Rejeana Brock, MS, OTR/L ascom [phone] 02/12/20, 10:47 AM

## 2020-02-12 NOTE — TOC Initial Note (Addendum)
Transition of Care [facility]) - Initial/Assessment Note    Patient Details  Name: Carla Dickson MRN: [id number] Date of Birth: 12/30/1970  Transition of [facility]) CM/SW Contact:    Barrie Dunker, RN Phone Number: 02/12/2020, 3:54 PM  Clinical Narrative:                  The patient lives alone and is alert but not oriented to where she is or why It is recommended to go to SNF, she does not have any insurance She relies on her Dad and other family to provide transportation to take her where she needs to go I called the Director of the [facility] department to inquire about an LOG to pay for SNF  I reached out to [employer] with first [employer] for financial assistance and left a secure voice mail for a call back asking for assistance to fill out Medicaid and disability Will continue to work with the patient and try to find a rehab bed       Patient Goals and CMS Choice        Expected Discharge Plan and Services                                                Prior Living Arrangements/Services                       Activities of Daily Living      Permission Sought/Granted                  Emotional Assessment              Admission diagnosis:  Seizure disorder (HCC) [G40.909] Acute encephalopathy [G93.40] Alcohol withdrawal syndrome, with delirium (HCC) [F10.231] Patient Active Problem List   Diagnosis Date Noted  . Malnutrition of moderate degree 02/08/2020  . Encounter for hospice care discussion   . Seizure disorder (HCC)   . Goals of care, counseling/discussion   . Palliative care by specialist   . DNR (do not resuscitate) discussion   . Acute encephalopathy 02/03/2020  . Alcohol withdrawal syndrome, with delirium (HCC)   . Encephalopathy 10/04/2019  . GERD (gastroesophageal reflux disease) 01/28/2019  . HTN (hypertension) 01/28/2019  . HLD (hyperlipidemia) 01/28/2019  . Alcohol use 01/28/2019  . Fall 01/28/2019  . SIRS  (systemic inflammatory response syndrome) (HCC) 01/28/2019   PCP:  Patient, No Pcp Per Pharmacy:   [facility] [address] [place] - [address] [place] [address] Phone: [phone] Fax: [phone]     Social Determinants of Health (SDOH) Interventions    Readmission Risk Interventions No flowsheet data found.

## 2020-02-13 DIAGNOSIS — F10121 Alcohol abuse with intoxication delirium: Secondary | ICD-10-CM

## 2020-02-13 LAB — PHOSPHORUS: Phosphorus: 2.6 mg/dL (ref 2.5–4.6)

## 2020-02-13 LAB — CBC WITH DIFFERENTIAL/PLATELET
Abs Immature Granulocytes: 0.07 10*3/uL (ref 0.00–0.07)
Basophils Absolute: 0 10*3/uL (ref 0.0–0.1)
Basophils Relative: 0 %
Eosinophils Absolute: 0.1 10*3/uL (ref 0.0–0.5)
Eosinophils Relative: 1 %
HCT: 28.5 % — ABNORMAL LOW (ref 36.0–46.0)
Hemoglobin: 10.1 g/dL — ABNORMAL LOW (ref 12.0–15.0)
Immature Granulocytes: 1 %
Lymphocytes Relative: 15 %
Lymphs Abs: 2 10*3/uL (ref 0.7–4.0)
MCH: 32.7 pg (ref 26.0–34.0)
MCHC: 35.4 g/dL (ref 30.0–36.0)
MCV: 92.2 fL (ref 80.0–100.0)
Monocytes Absolute: 1.3 10*3/uL — ABNORMAL HIGH (ref 0.1–1.0)
Monocytes Relative: 10 %
Neutro Abs: 9.6 10*3/uL — ABNORMAL HIGH (ref 1.7–7.7)
Neutrophils Relative %: 73 %
Platelets: 319 10*3/uL (ref 150–400)
RBC: 3.09 MIL/uL — ABNORMAL LOW (ref 3.87–5.11)
RDW: 15.9 % — ABNORMAL HIGH (ref 11.5–15.5)
WBC: 13 10*3/uL — ABNORMAL HIGH (ref 4.0–10.5)
nRBC: 0 % (ref 0.0–0.2)

## 2020-02-13 LAB — BASIC METABOLIC PANEL
Anion gap: 8 (ref 5–15)
BUN: 6 mg/dL (ref 6–20)
CO2: 25 mmol/L (ref 22–32)
Calcium: 8.7 mg/dL — ABNORMAL LOW (ref 8.9–10.3)
Chloride: 102 mmol/L (ref 98–111)
Creatinine, Ser: 0.46 mg/dL (ref 0.44–1.00)
GFR calc Af Amer: >60 mL/min (ref >60)
GFR calc non Af Amer: >60 mL/min (ref >60)
Glucose, Bld: 126 mg/dL — ABNORMAL HIGH (ref 70–99)
Potassium: 4 mmol/L (ref 3.5–5.1)
Sodium: 135 mmol/L (ref 135–145)

## 2020-02-13 LAB — MAGNESIUM: Magnesium: 1.8 mg/dL (ref 1.7–2.4)

## 2020-02-13 MED ORDER — MAGNESIUM SULFATE 2 GM/50ML IV SOLN
2.0000 g | Freq: Once | INTRAVENOUS | Status: AC
  Administered 2020-02-13: 2 g via INTRAVENOUS
  Filled 2020-02-13: qty 50

## 2020-02-13 NOTE — Progress Notes (Signed)
Daily Progress Note   Patient Name: Carla Dickson       Date: 02/13/2020 DOB: 1971/02/28  Age: 49 y.o. MRN#: [id number] Attending Physician: Charise Killian, MD Primary Care Physician: Patient, No Pcp Per Admit Date: 02/03/2020  Reason for Consultation/Follow-up: Establishing goals of care  Subjective: Patient awake, alert, oriented to person/place/year. In good spirits and smiling this afternoon. Reoriented her to day/time and situation. She is surprised to hear she had a stroke. She feels blessed and thanking God that she is doing better. She denies pain or discomfort. [name] is eager to work with therapy. Per RN, fed herself this morning. Taking medications without difficulty. Therapeutic listening as [name] speaks about the loss of her husband recently.   GOC:  No family at bedside.  Call placed to father, [name]. Provided update on diagnoses, interventions, plan of care, and need for SNF rehab. Explained that social work is following and will keep him updated on the plan once there are bed offers. Explained importance of sobriety moving forward. Answered questions and concerns regarding plan. [name] appreciative of call.   Length of Stay: 10  Current Medications: Scheduled Meds:  . aspirin EC  81 mg Oral Daily  . atorvastatin  40 mg Oral Daily  . Chlorhexidine Gluconate Cloth  6 each Topical Daily  . cloNIDine  0.2 mg Oral TID  . divalproex  250 mg Oral Q8H  . enoxaparin (LOVENOX) injection  40 mg Subcutaneous Q24H  . feeding supplement (ENSURE ENLIVE)  237 mL Oral TID BM  . folic acid  1 mg Oral Daily  . levETIRAcetam  500 mg Oral BID  . multivitamin with minerals  1 tablet Oral Daily  . pantoprazole  40 mg Oral Daily  . phenobarbital  32.4 mg Oral BID  .  thiamine  100 mg Oral Daily    Continuous Infusions: . sodium chloride Stopped (02/10/20 0008)  . 0.9 % NaCl with KCl 40 mEq / L 75 mL/hr (02/13/20 1227)    PRN Meds: sodium chloride, acetaminophen, albuterol, docusate sodium, LORazepam, ondansetron (ZOFRAN) IV, polyethylene glycol, sodium chloride flush  Physical Exam Vitals and nursing note reviewed.  HENT:     Head: Normocephalic and atraumatic.  Pulmonary:     Effort: No tachypnea, accessory muscle usage or respiratory distress.  Skin:  General: Skin is warm and dry.  Neurological:     Mental Status: She is alert and oriented to person, place, and time.  Psychiatric:        Attention and Perception: Attention normal.        Mood and Affect: Mood normal.        Speech: Speech normal.            Vital Signs: BP (!) 147/84 (BP Location: Right Arm)   Pulse 96   Temp 98.6 F (37 C) (Oral)   Resp 20   Ht 5\' 4"  (1.626 m)   Wt 60.7 kg   SpO2 100%   BMI 22.97 kg/m  SpO2: SpO2: 100 % O2 Device: O2 Device: Room Air O2 Flow Rate: O2 Flow Rate (L/min): 0 L/min  Intake/output summary:   Intake/Output Summary (Last 24 hours) at 02/13/2020 1424 Last data filed at 02/13/2020 1251 Gross per 24 hour  Intake 410 ml  Output 2300 ml  Net -1890 ml   LBM: Last BM Date: 02/12/20 Baseline Weight: Weight: 68 kg Most recent weight: Weight: 60.7 kg    Palliative Assessment/Data:  PPS 40%   Flowsheet Rows     Most Recent Value  Intake Tab  Referral Department  Hospitalist  Unit at Time of Referral  ICU  Palliative Care Primary Diagnosis  Neurology  Date Notified  02/05/20  Palliative Care Type  New Palliative care  Reason for referral  Clarify Goals of Care  Date of Admission  02/03/20  Date first seen by Palliative Care  02/05/20  # of days Palliative referral response time  0 Day(s)  # of days IP prior to Palliative referral  2  Clinical Assessment  Palliative Performance Scale Score  40%  Pain Max last 24 hours  Not  able to report  Pain Min Last 24 hours  Not able to report  Dyspnea Max Last 24 Hours  Not able to report  Dyspnea Min Last 24 hours  Not able to report  Psychosocial & Spiritual Assessment  Palliative Care Outcomes      Patient Active Problem List   Diagnosis Date Noted  . Malnutrition of moderate degree 02/08/2020  . Encounter for hospice care discussion   . Seizure disorder (HCC)   . Goals of care, counseling/discussion   . Palliative care by specialist   . DNR (do not resuscitate) discussion   . Acute encephalopathy 02/03/2020  . Alcohol withdrawal syndrome, with delirium (HCC)   . Encephalopathy 10/04/2019  . GERD (gastroesophageal reflux disease) 01/28/2019  . HTN (hypertension) 01/28/2019  . HLD (hyperlipidemia) 01/28/2019  . Alcohol use 01/28/2019  . Fall 01/28/2019  . SIRS (systemic inflammatory response syndrome) (HCC) 01/28/2019    Palliative Care Assessment & Plan   Patient Profile: 49 y.o. 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   Assessment: Severe acute toxic metabolic encephalopathy Acute CVA ETOH abuse with severe DT's Hx of seizures Encephalomalacia and significant atrophy on CT scan    Recommendations/Plan:  Continue current plan of care and medical management. Continue to treat the treatable.  Clinical improvement. Out of ICU.  Continue PT/OT efforts. TOC team following for placement.   Encouraged  family visits with assistance and encouragement during meals.   Recommend outpatient palliative referral.   Code Status:  DNR   Code Status Orders  (From admission, onward)          Start     Ordered   02/06/20 1417  Do not attempt resuscitation (DNR)  Continuous    Question Answer Comment  In the event of cardiac or respiratory ARREST Do not call a "code blue"   In the event of cardiac or respiratory ARREST Do not perform Intubation, CPR, defibrillation or ACLS   In the event of cardiac or respiratory ARREST Use medication by any route, position, wound care, and other measures to relive pain and suffering. May use oxygen, suction and manual treatment of airway obstruction as needed for comfort.      02/06/20 1416        Code Status History    Date Active Date Inactive Code Status Order ID Comments User Context   02/03/2020 1629 02/06/2020 1416 Full Code [id number]  Tyler Pita, MD ED   10/04/2019 1455 10/09/2019 1625 Full Code [id number]  Flora Lipps, MD ED   01/28/2019 2307 02/01/2019 1841 Full Code [id number]  Lance Coon, MD Inpatient   Advance Care Planning Activity       Prognosis:   Unable to determine  Discharge Planning:  To Be Determined  Care plan was discussed with RN, patient, father Glendell Docker)  Thank you for allowing the Palliative Medicine Team to assist in the care of this patient.   Time In: 1200- Time Out: 1225 Total Time 25 Prolonged Time Billed  no    Greater than 50% of this time was spent counseling and coordinating care related to the above assessment and plan.   Ihor Dow, DNP, FNP-C Palliative Medicine Team  Phone: [phone] Fax: [phone]  Please contact Palliative Medicine Team phone at [phone] for questions and concerns.

## 2020-02-13 NOTE — Plan of Care (Signed)

## 2020-02-13 NOTE — Progress Notes (Addendum)
PROGRESS NOTE    NIESHA BAME  AOZ:[id number] DOB: [birth year] DOA: 02/03/2020 PCP: Patient, No Pcp Per      Assessment & Plan:   Active Problems:   Acute encephalopathy   Seizure disorder (HCC)   Goals of care, counseling/discussion   Palliative care by specialist   DNR (do not resuscitate) discussion   Encounter for hospice care discussion   Malnutrition of moderate degree   Acute metabolic encephalopathy: secondary to DTs from ETOH withdrawal.  Ethanol abuse withwithdrawal: initially requiring precedex drip in ICU. Off drip and transferred to [facility] on 5/18. Continue on thiamine & folic acid. Continue CIWA. Aspiration precaution. Alcohol cessation counseling. Pt was drinking secondary to husband passing away approx 3 months ago. 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 Continue asparin, statin. 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   H/o seizure: continue phenobarbital, keppra, depakote   Normocytic anemia: likely secondary to alcohol abuse. No need for a transfusion at this time. Will continue to monitor   Leukocytosis: likely reactive. Will continue to monitor   HLD: continue on statin    DVT prophylaxis: lovenox Code Status: DNR Family Communication:  Disposition Plan: likely d/c SNF. Barrier to d/c is no insurance. CM is aware and working on this    Consultants:   ICU   Procedures:    Antimicrobials:    Status is: Inpatient  Remains inpatient appropriate because:Unsafe d/c plan   Dispo: The patient is from: Home              Anticipated d/c is to: SNF              Anticipated d/c date is: 3 days              Patient currently is not medically stable to d/c.    Subjective: Pt c/o weakness  Objective: Vitals:   02/13/20 0000 02/13/20 0146 02/13/20 0148 02/13/20 0500  BP:  (!) 135/94    Pulse: 100 (!) 110 (!) 105   Resp:  18     Temp:  98.2 F (36.8 C)    TempSrc:  Oral    SpO2:  100%    Weight:    60.7 kg  Height:        Intake/Output Summary (Last 24 hours) at 02/13/2020 0733 Last data filed at 02/13/2020 0506 Gross per 24 hour  Intake 50 ml  Output 1600 ml  Net -1550 ml   Filed Weights   02/11/20 0500 02/12/20 0500 02/13/20 0500  Weight: 61.6 kg 61.7 kg 60.7 kg    Examination:  General exam: Appears calm and comfortable  Respiratory system: Clear to auscultation. Respiratory effort normal. Cardiovascular system: S1 & S2+. No rubs, gallops or clicks. No pedal edema. Gastrointestinal system: Abdomen is nondistended, soft and nontender.  Normal bowel sounds heard. Central nervous system: Alert and oriented. Moves all 4 extremities  Psychiatry: Judgement and insight appear normal. Flat mood and affect    Data Reviewed: I have personally reviewed following labs and imaging studies  CBC: Recent Labs  Lab 02/08/20 0333 02/09/20 0436 02/11/20 0553 02/12/20 0449 02/13/20 0534  WBC 4.6 6.8 12.1* 11.6* 13.0*  NEUTROABS 2.8 5.1 8.2* 8.6* 9.6*  HGB 13.3 13.7 10.9* 11.7* 10.1*  HCT 38.9 40.2 31.7* 32.3* 28.5*  MCV 97.0 97.6 96.4 90.2 92.2  PLT 74* 98* 152 252 319   Basic Metabolic Panel: Recent  Labs  Lab 02/09/20 0436 02/10/20 0434 02/11/20 0553 02/12/20 0449 02/13/20 0534  NA 140 139 135 132* 135  K 3.9 3.5 3.5 3.5 4.0  CL 104 106 103 98 102  CO2 29 24 24 24 25   GLUCOSE 96 82 83 114* 126*  BUN 18 18 12 8 6   CREATININE 0.62 0.55 0.45 0.44 0.46  CALCIUM 8.9 8.6* 8.6* 9.0 8.7*  MG 1.9 1.6* 2.0 1.7 1.8  PHOS 5.5* 4.0 3.4 3.3 2.6   GFR: Estimated Creatinine Clearance: 74.3 mL/min (by C-G formula based on SCr of 0.46 mg/dL). Liver Function Tests: No results for input(s): AST, ALT, ALKPHOS, BILITOT, PROT, ALBUMIN in the last 168 hours. No results for input(s): LIPASE, AMYLASE in the last 168 hours. No results for input(s): AMMONIA in the last 168 hours. Coagulation Profile: No  results for input(s): INR, PROTIME in the last 168 hours. Cardiac Enzymes: No results for input(s): CKTOTAL, CKMB, CKMBINDEX, TROPONINI in the last 168 hours. BNP (last 3 results) No results for input(s): PROBNP in the last 8760 hours. HbA1C: No results for input(s): HGBA1C in the last 72 hours. CBG: No results for input(s): GLUCAP in the last 168 hours. Lipid Profile: No results for input(s): CHOL, HDL, LDLCALC, TRIG, CHOLHDL, LDLDIRECT in the last 72 hours. Thyroid Function Tests: No results for input(s): TSH, T4TOTAL, FREET4, T3FREE, THYROIDAB in the last 72 hours. Anemia Panel: No results for input(s): VITAMINB12, FOLATE, FERRITIN, TIBC, IRON, RETICCTPCT in the last 72 hours. Sepsis Labs: No results for input(s): PROCALCITON, LATICACIDVEN in the last 168 hours.  Recent Results (from the past 240 hour(s))  Urine culture     Status: Abnormal   Collection Time: 02/03/20  2:29 PM   Specimen: Urine, Catheterized  Result Value Ref Range Status   Specimen Description   Final    URINE, CATHETERIZED Performed at [facility], [address] [place], [address] [place]    Special Requests   Final    NONE Performed at [facility], [address] [place], [address] [place]    Culture MULTIPLE SPECIES PRESENT, SUGGEST RECOLLECTION (A)  Final   Report Status 02/04/2020 FINAL  Final  Culture, blood (routine x 2)     Status: None   Collection Time: 02/03/20  3:09 PM   Specimen: BLOOD  Result Value Ref Range Status   Specimen Description BLOOD R HAND  Final   Special Requests   Final    BOTTLES DRAWN AEROBIC AND ANAEROBIC Blood Culture results may not be optimal due to an inadequate volume of blood received in culture bottles   Culture   Final    NO GROWTH 5 DAYS Performed at [facility], [address], [place], [address] [place]    Report Status 02/08/2020 FINAL  Final  Culture, blood (routine x 2)     Status: None   Collection Time: 02/03/20  3:09  PM   Specimen: BLOOD  Result Value Ref Range Status   Specimen Description BLOOD L H  Final   Special Requests   Final    BOTTLES DRAWN AEROBIC AND ANAEROBIC Blood Culture adequate volume   Culture   Final    NO GROWTH 5 DAYS Performed at [facility], [address], [place], [address] [place]    Report Status 02/08/2020 FINAL  Final  Respiratory Panel by RT PCR (Flu A&B, Covid) - Nasopharyngeal Swab     Status: None   Collection Time: 02/03/20  3:34 PM   Specimen:  Nasopharyngeal Swab  Result Value Ref Range Status   SARS Coronavirus 2 by RT PCR NEGATIVE NEGATIVE Final    Comment: (NOTE) SARS-CoV-2 target nucleic acids are NOT DETECTED. The SARS-CoV-2 RNA is generally detectable in upper respiratoy specimens during the acute phase of infection. The lowest concentration of SARS-CoV-2 viral copies this assay can detect is 131 copies/mL. A negative result does not preclude SARS-Cov-2 infection and should not be used as the sole basis for treatment or other patient management decisions. A negative result may occur with  improper specimen collection/handling, submission of specimen other than nasopharyngeal swab, presence of viral mutation(s) within the areas targeted by this assay, and inadequate number of viral copies (<131 copies/mL). A negative result must be combined with clinical observations, patient history, and epidemiological information. The expected result is Negative. Fact Sheet for Patients:  https://www.moore.com/ Fact Sheet for Healthcare Providers:  https://www.young.biz/ This test is not yet ap proved or cleared by the [place] FDA and  has been authorized for detection and/or diagnosis of SARS-CoV-2 by FDA under an Emergency Use Authorization (EUA). This EUA will remain  in effect (meaning this test can be used) for the duration of the COVID-19 declaration under Section 564(b)(1) of the Act, 21 U.S.C. section  360bbb-3(b)(1), unless the authorization is terminated or revoked sooner.    Influenza A by PCR NEGATIVE NEGATIVE Final   Influenza B by PCR NEGATIVE NEGATIVE Final    Comment: (NOTE) The Xpert Xpress SARS-CoV-2/FLU/RSV assay is intended as an aid in  the diagnosis of influenza from Nasopharyngeal swab specimens and  should not be used as a sole basis for treatment. Nasal washings and  aspirates are unacceptable for Xpert Xpress SARS-CoV-2/FLU/RSV  testing. Fact Sheet for Patients: https://www.moore.com/ Fact Sheet for Healthcare Providers: https://www.young.biz/ This test is not yet approved or cleared by the [place] FDA and  has been authorized for detection and/or diagnosis of SARS-CoV-2 by  FDA under an Emergency Use Authorization (EUA). This EUA will remain  in effect (meaning this test can be used) for the duration of the  Covid-19 declaration under Section 564(b)(1) of the Act, 21  U.S.C. section 360bbb-3(b)(1), unless the authorization is  terminated or revoked. Performed at [facility], [address] [place], [place] [address]   MRSA PCR Screening     Status: Abnormal   Collection Time: 02/03/20  5:15 PM   Specimen: Nasopharyngeal  Result Value Ref Range Status   MRSA by PCR POSITIVE (A) NEGATIVE Final    Comment:        The GeneXpert MRSA Assay (FDA approved for NASAL specimens only), is one component of a comprehensive MRSA colonization surveillance program. It is not intended to diagnose MRSA infection nor to guide or monitor treatment for MRSA infections. RESULT CALLED TO, READ BACK BY AND VERIFIED WITH: SARA MOORE ON 02/03/2020 AT 1851 TIK Performed at [facility], [address] [place], [place] [address]   Urine Culture     Status: Abnormal   Collection Time: 02/09/20  8:53 PM   Specimen: Urine, Random  Result Value Ref Range Status   Specimen Description   Final    URINE,  RANDOM Performed at [facility], [address], [place], [place] [address]    Special Requests   Final    NONE Performed at [facility], [address] [place], [place] [address]    Culture (A)  Final    <10,000 COLONIES/mL INSIGNIFICANT GROWTH Performed at [facility], [place] [address] [place], [place] [address]    Report Status 02/11/2020 FINAL  Final         Radiology Studies: No results found.      Scheduled Meds: . aspirin EC  81 mg Oral Daily  . atorvastatin  40 mg Oral Daily  . Chlorhexidine Gluconate Cloth  6 each Topical Daily  . cloNIDine  0.2 mg Oral TID  . divalproex  250 mg Oral Q8H  . enoxaparin (LOVENOX) injection  40 mg Subcutaneous Q24H  . feeding supplement (ENSURE ENLIVE)  237 mL Oral TID BM  . folic acid  1 mg Oral Daily  . levETIRAcetam  500 mg Oral BID  . multivitamin with minerals  1 tablet Oral Daily  . pantoprazole  40 mg Oral Daily  . phenobarbital  32.4 mg Oral BID  . thiamine  100 mg Oral Daily   Continuous Infusions: . sodium chloride Stopped (02/10/20 0008)  . 0.9 % NaCl with KCl 40 mEq / L 75 mL/hr (02/13/20 0329)     LOS: 10 days    Time spent: 32 mins     Wyvonnia Dusky, MD [facility] Pager [phone]  If 7PM-7AM, please contact night-coverage [email or web address] 02/13/2020, 7:33 AM

## 2020-02-13 NOTE — Progress Notes (Signed)
Physical Therapy Treatment Patient Details Name: Carla Dickson MRN: [id number] DOB: [birth year] Today's Date: 02/13/2020    History of Present Illness Pt admitted for acute encephalopathy with complaints of unresponsive event at home with fall. 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 Hospital course has been complicated by agitation, lethargy and was requiring a precedex drip in CCU. Due to inability to work with therapy, inital consult discontinued. New consult received and evaluation completed this date.    PT Comments    Pt is making good progress towards goals with ability to ambulate in room. Still very unsteady and unable to stand up without external support from therapist and RW. Runs into obstacles easily and needs cues for safety. Overall, improved cognition level and able to perform there-ex. Continue to recommend SNF for strengthening/balance/safety. Pt is very high falls risk. Will continue to progress.   Follow Up Recommendations  SNF     Equipment Recommendations       Recommendations for Other Services       Precautions / Restrictions Precautions Precautions: Fall Restrictions Weight Bearing Restrictions: No    Mobility  Bed Mobility Overal bed mobility: Needs Assistance Bed Mobility: Supine to Sit;Sit to Supine     Supine to sit: Min assist     General bed mobility comments: improved technique with ability to follow commands. Once seated at EOB, upright posture noted  Transfers Overall transfer level: Needs assistance Equipment used: Rolling walker (2 wheeled) Transfers: Sit to/from Stand Sit to Stand: Min assist         General transfer comment: improved technique, however does demonstrate post leaning and generally unsteady. 1st attempt without RW and was unable to maintain balance  Ambulation/Gait Ambulation/Gait assistance: Min assist Gait Distance (Feet): 40 Feet Assistive device: Rolling walker (2  wheeled) Gait Pattern/deviations: Step-through pattern     General Gait Details: Pt ambulated in room with min assist and RW. Has difficulty with obstacle avoidance and loses balance easily. Needs hands on assist to prevent falls   Stairs             Wheelchair Mobility    Modified Rankin (Stroke Patients Only)       Balance Overall balance assessment: History of Falls;Needs assistance Sitting-balance support: Bilateral upper extremity supported;Feet supported Sitting balance-Leahy Scale: Good     Standing balance support: Bilateral upper extremity supported Standing balance-Leahy Scale: Fair                              Cognition Arousal/Alertness: Awake/alert Behavior During Therapy: WFL for tasks assessed/performed Overall Cognitive Status: No family/caregiver present to determine baseline cognitive functioning                                 General Comments: alert and improved orientation, however still remains confused to situation      Exercises Other Exercises Other Exercises: supine ther-ex performed on B LE including SLR, hip abd/add, hip add squeezes, and LAQ. All ther-ex performed x 12 reps with cga    General Comments        Pertinent Vitals/Pain Pain Assessment: No/denies pain    Home Living                      Prior Function            PT Goals (  current goals can now be found in the care plan section) Acute Rehab PT Goals Patient Stated Goal: to walk again PT Goal Formulation: With patient Time For Goal Achievement: 02/26/20 Potential to Achieve Goals: Good Progress towards PT goals: Progressing toward goals    Frequency    Min 2X/week      PT Plan Current plan remains appropriate    Co-evaluation              AM-PAC PT "6 Clicks" Mobility   Outcome Measure  Help needed turning from your back to your side while in a flat bed without using bedrails?: A Little Help needed moving  from lying on your back to sitting on the side of a flat bed without using bedrails?: A Little Help needed moving to and from a bed to a chair (including a wheelchair)?: A Little Help needed standing up from a chair using your arms (e.g., wheelchair or bedside chair)?: A Little Help needed to walk in hospital room?: A Little Help needed climbing 3-5 steps with a railing? : A Little 6 Click Score: 18    End of Session Equipment Utilized During Treatment: Gait belt Activity Tolerance: Patient tolerated treatment well Patient left: in chair;with chair alarm set Nurse Communication: Mobility status PT Visit Diagnosis: Unsteadiness on feet (R26.81);Muscle weakness (generalized) (M62.81);History of falling (Z91.81);Difficulty in walking, not elsewhere classified (R26.2);Pain     Time: 1455-1518 PT Time Calculation (min) (ACUTE ONLY): 23 min  Charges:  $Gait Training: 8-22 mins $Therapeutic Exercise: 8-22 mins                     Greggory Stallion, PT, DPT [phone]    Ray,Stephanie 02/13/2020, 4:47 PM

## 2020-02-13 NOTE — [place] FL2 (Signed)
[place] MEDICAID FL2 LEVEL OF CARE SCREENING TOOL     IDENTIFICATION  Patient Name: Carla Dickson Birthdate: 1971-04-13 Sex: female Admission Date (Current Location): 02/03/2020  [place] and [place] Number:  Chiropodist and Address:  [facility], [address], [place], [place] [address]      Provider Number: [id number]  Attending Physician Name and Address:  Charise Killian, MD  Relative Name and Phone Number:  Daleen Snook [phone]    Current Level of Care: Hospital Recommended Level of Care: Skilled Nursing Facility Prior Approval Number:    Date Approved/Denied:   PASRR Number: [id number]  Discharge Plan: SNF    Current Diagnoses: Patient Active Problem List   Diagnosis Date Noted  . Malnutrition of moderate degree 02/08/2020  . Encounter for hospice care discussion   . Seizure disorder (HCC)   . Goals of care, counseling/discussion   . Palliative care by specialist   . DNR (do not resuscitate) discussion   . Acute encephalopathy 02/03/2020  . Alcohol withdrawal syndrome, with delirium (HCC)   . Encephalopathy 10/04/2019  . GERD (gastroesophageal reflux disease) 01/28/2019  . HTN (hypertension) 01/28/2019  . HLD (hyperlipidemia) 01/28/2019  . Alcohol use 01/28/2019  . Fall 01/28/2019  . SIRS (systemic inflammatory response syndrome) (HCC) 01/28/2019    Orientation RESPIRATION BLADDER Height & Weight     Self  Normal External catheter, Incontinent Weight: 60.7 kg Height:  5\' 4"  (162.6 cm)  BEHAVIORAL SYMPTOMS/MOOD NEUROLOGICAL BOWEL NUTRITION STATUS      Continent    AMBULATORY STATUS COMMUNICATION OF NEEDS Skin   Extensive Assist Verbally Normal                       Personal Care Assistance Level of Assistance  Bathing, Dressing, Feeding Bathing Assistance: Limited assistance Feeding assistance: Limited assistance Dressing Assistance: Limited assistance     Functional Limitations Info   Speech     Speech Info: Impaired    SPECIAL CARE FACTORS FREQUENCY  PT (By licensed PT), OT (By licensed OT)     PT Frequency: 5 times per week OT Frequency: 5 times per week            Contractures Contractures Info: Not present    Additional Factors Info  Code Status, Allergies Code Status Info: DNR Allergies Info: NKDA           Current Medications (02/13/2020):  This is the current hospital active medication list Current Facility-Administered Medications  Medication Dose Route Frequency Provider Last Rate Last Admin  . 0.9 %  sodium chloride infusion   Intravenous PRN 02/15/2020, MD   Stopped at 02/10/20 0008  . 0.9 % NaCl with KCl 40 mEq / L  infusion   Intravenous Continuous 02/12/20, MD 75 mL/hr at 02/13/20 0329 75 mL/hr at 02/13/20 0329  . acetaminophen (TYLENOL) tablet 650 mg  650 mg Oral Q6H PRN 02/15/20, MD   650 mg at 02/13/20 1012  . albuterol (PROVENTIL) (2.5 MG/3ML) 0.083% nebulizer solution 2.5 mg  2.5 mg Nebulization Q2H PRN 02/15/20, MD      . aspirin EC tablet 81 mg  81 mg Oral Daily Salena Saner, MD   81 mg at 02/13/20 0955  . atorvastatin (LIPITOR) tablet 40 mg  40 mg Oral Daily 02/15/20, MD   40 mg at 02/13/20 0955  . Chlorhexidine Gluconate Cloth 2 % PADS 6 each  6 each Topical Daily 02/15/20,  Lolita Cram, MD   6 each at 02/13/20 1056  . cloNIDine (CATAPRES) tablet 0.2 mg  0.2 mg Oral TID Ottie Glazier, MD   0.2 mg at 02/13/20 0956  . divalproex (DEPAKOTE SPRINKLE) capsule 250 mg  250 mg Oral Q8H Aleskerov, Fuad, MD   250 mg at 02/13/20 0522  . docusate sodium (COLACE) capsule 100 mg  100 mg Oral BID PRN Tyler Pita, MD      . enoxaparin (LOVENOX) injection 40 mg  40 mg Subcutaneous Q24H Flora Lipps, MD   40 mg at 02/13/20 0956  . feeding supplement (ENSURE ENLIVE) (ENSURE ENLIVE) liquid 237 mL  237 mL Oral TID BM Flora Lipps, MD   237 mL at 02/13/20 1000  . folic acid (FOLVITE) tablet 1 mg  1 mg Oral Daily Flora Lipps,  MD   1 mg at 02/13/20 0956  . levETIRAcetam (KEPPRA) tablet 500 mg  500 mg Oral BID Ottie Glazier, MD   500 mg at 02/13/20 0955  . LORazepam (ATIVAN) injection 2 mg  2 mg Intravenous Q4H PRN Flora Lipps, MD   2 mg at 02/13/20 0202  . magnesium sulfate IVPB 2 g 50 mL  2 g Intravenous Once Rito Ehrlich A, RPH 50 mL/hr at 02/13/20 1010 2 g at 02/13/20 1010  . multivitamin with minerals tablet 1 tablet  1 tablet Oral Daily Ottie Glazier, MD   1 tablet at 02/13/20 0955  . ondansetron (ZOFRAN) injection 4 mg  4 mg Intravenous Q6H PRN Tyler Pita, MD   4 mg at 02/10/20 1224  . pantoprazole (PROTONIX) EC tablet 40 mg  40 mg Oral Daily Flora Lipps, MD   40 mg at 02/13/20 0955  . PHENobarbital (LUMINAL) tablet 32.4 mg  32.4 mg Oral BID Ottie Glazier, MD   32.4 mg at 02/13/20 0955  . polyethylene glycol (MIRALAX / GLYCOLAX) packet 17 g  17 g Oral Daily PRN Tyler Pita, MD      . sodium chloride flush (NS) 0.9 % injection 10-40 mL  10-40 mL Intracatheter PRN Tyler Pita, MD      . thiamine tablet 100 mg  100 mg Oral Daily Flora Lipps, MD   100 mg at 02/13/20 3500     Discharge Medications: Please see discharge summary for a list of discharge medications.  Relevant Imaging Results:  Relevant Lab Results:   Additional Information SS# [id number]  Su Hilt, RN

## 2020-02-13 NOTE — TOC Progression Note (Signed)
Transition of Care [facility]) - Progression Note    Patient Details  Name: Carla Dickson MRN: [id number] Date of Birth: [birth year]  Transition [facility]) CM/SW Contact  Barrie Dunker, RN Phone Number: 02/13/2020, 1:54 PM  Clinical Narrative:     No bed offers for SNF at this time, will continue to try to find a SNF bed for rehab, barriers are no insurance, and ETOH abuse as well as disposition once leaving SNF       Expected Discharge Plan and Services                                                 Social Determinants of Health (SDOH) Interventions    Readmission Risk Interventions No flowsheet data found.

## 2020-02-13 NOTE — Progress Notes (Signed)
Pharmacy Electrolyte Monitoring Consult:   Labs:  Sodium (mmol/L)  Date Value  02/13/2020 135  10/22/2014 139   Potassium (mmol/L)  Date Value  02/13/2020 4.0  10/22/2014 3.3 (L)   Magnesium (mg/dL)  Date Value  83/67/2550 1.8   Phosphorus (mg/dL)  Date Value  01/64/2903 2.6   Calcium (mg/dL)  Date Value  79/55/8316 8.7 (L)   Calcium, Total (mg/dL)  Date Value  74/25/5258 8.6   Albumin (g/dL)  Date Value  94/83/4758 3.1 (L)  10/22/2014 4.0   Corrected Ca: 9.4 mg/dL  Assessment: 30X.o. female admitted to ICU on 02/03/2020 for seizures. Pharmacy consulted to assist in monitoring and replacing electrolytes. Patient is having improved PO intake at this time.  Goals of Therapy:  Will treat to potassium ~4 and magnesium ~2.  All other electrolytes WNL.  Plan: Currently on NS with KCl 40 mEq/L at 75 mL/hr. Will order Magnesium 2g IV x 1 dose.  Will follow electrolytes with AM labs, and adjust per consult.  Bettey Costa, PharmD Clinical Pharmacist 02/13/2020 7:44 AM

## 2020-02-13 NOTE — TOC Progression Note (Signed)
Transition of Care [facility]) - Progression Note    Patient Details  Name: Carla Dickson MRN: [id number] Date of Birth: 11/20/1970  [facility]) CM/SW Contact  Barrie Dunker, RN Phone Number: 02/13/2020, 11:00 AM  Clinical Narrative:     Spoke with the Asst Director to [facility] department and they have agreed to do an LOC, I did a FL2 and PASSR and bed search looking for a bed for rehab       Expected Discharge Plan and Services                                                 Social Determinants of Health (SDOH) Interventions    Readmission Risk Interventions No flowsheet data found.

## 2020-02-14 LAB — PHOSPHORUS: Phosphorus: 2 mg/dL — ABNORMAL LOW (ref 2.5–4.6)

## 2020-02-14 LAB — CBC WITH DIFFERENTIAL/PLATELET
Abs Immature Granulocytes: 0.1 10*3/uL — ABNORMAL HIGH (ref 0.00–0.07)
Basophils Absolute: 0 10*3/uL (ref 0.0–0.1)
Basophils Relative: 0 %
Eosinophils Absolute: 0.1 10*3/uL (ref 0.0–0.5)
Eosinophils Relative: 1 %
HCT: 29.3 % — ABNORMAL LOW (ref 36.0–46.0)
Hemoglobin: 10.4 g/dL — ABNORMAL LOW (ref 12.0–15.0)
Immature Granulocytes: 1 %
Lymphocytes Relative: 14 %
Lymphs Abs: 2.2 10*3/uL (ref 0.7–4.0)
MCH: 32.3 pg (ref 26.0–34.0)
MCHC: 35.5 g/dL (ref 30.0–36.0)
MCV: 91 fL (ref 80.0–100.0)
Monocytes Absolute: 1.1 10*3/uL — ABNORMAL HIGH (ref 0.1–1.0)
Monocytes Relative: 7 %
Neutro Abs: 11.8 10*3/uL — ABNORMAL HIGH (ref 1.7–7.7)
Neutrophils Relative %: 77 %
Platelets: 359 10*3/uL (ref 150–400)
RBC: 3.22 MIL/uL — ABNORMAL LOW (ref 3.87–5.11)
RDW: 16.5 % — ABNORMAL HIGH (ref 11.5–15.5)
Smear Review: NORMAL
WBC: 15.4 10*3/uL — ABNORMAL HIGH (ref 4.0–10.5)
nRBC: 0 % (ref 0.0–0.2)

## 2020-02-14 LAB — BASIC METABOLIC PANEL
Anion gap: 6 (ref 5–15)
BUN: 8 mg/dL (ref 6–20)
CO2: 25 mmol/L (ref 22–32)
Calcium: 8.9 mg/dL (ref 8.9–10.3)
Chloride: 101 mmol/L (ref 98–111)
Creatinine, Ser: 0.46 mg/dL (ref 0.44–1.00)
GFR calc Af Amer: >60 mL/min (ref >60)
GFR calc non Af Amer: >60 mL/min (ref >60)
Glucose, Bld: 92 mg/dL (ref 70–99)
Potassium: 4.8 mmol/L (ref 3.5–5.1)
Sodium: 132 mmol/L — ABNORMAL LOW (ref 135–145)

## 2020-02-14 LAB — MAGNESIUM: Magnesium: 1.9 mg/dL (ref 1.7–2.4)

## 2020-02-14 MED ORDER — HYDROCODONE-ACETAMINOPHEN 5-325 MG PO TABS
1.0000 | ORAL_TABLET | Freq: Four times a day (QID) | ORAL | Status: DC | PRN
  Administered 2020-02-14: 1 via ORAL
  Filled 2020-02-14: qty 1

## 2020-02-14 MED ORDER — SODIUM PHOSPHATES 45 MMOLE/15ML IV SOLN
10.0000 mmol | Freq: Once | INTRAVENOUS | Status: AC
  Administered 2020-02-14: 10 mmol via INTRAVENOUS
  Filled 2020-02-14: qty 3.33

## 2020-02-14 NOTE — Progress Notes (Signed)
Physical Therapy Treatment Patient Details Name: Carla Dickson MRN: [id number] DOB: 03/03/1971 Today's Date: 02/14/2020    History of Present Illness Pt admitted for acute encephalopathy with complaints of unresponsive event at home with fall. 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 Hospital course has been complicated by agitation, lethargy and was requiring a precedex drip in CCU. Due to inability to work with therapy, inital consult discontinued. New consult received and evaluation completed this date.    PT Comments    Pt is making great progress towards goals with ability to ambulate 2 laps around RN station this date. Improved technique with RW, however still needs supervision due to LOB x 2. Pt with bright affect and engaged with therapy. Able to perform seated/standing there-ex. Encouraged to ambulate to bathroom with RN staff instead of using purewick. WIll continue to progress.   Follow Up Recommendations  Home health PT     Equipment Recommendations  Rolling walker with 5" wheels    Recommendations for Other Services       Precautions / Restrictions Precautions Precautions: Fall Restrictions Weight Bearing Restrictions: No    Mobility  Bed Mobility Overal bed mobility: Modified Independent Bed Mobility: Supine to Sit     Supine to sit: Modified independent (Device/Increase time) Sit to supine: Modified independent (Device/Increase time)   General bed mobility comments: improved technique with ability to transition to EOB safely. Once seated, upright posture noted  Transfers Overall transfer level: Needs assistance Equipment used: Rolling walker (2 wheeled) Transfers: Sit to/from Stand Sit to Stand: Min guard         General transfer comment: improved technique with occasional safety cues. Once standing, able to hold onto RW safely  Ambulation/Gait Ambulation/Gait assistance: Min guard Gait Distance (Feet): 400  Feet Assistive device: Rolling walker (2 wheeled) Gait Pattern/deviations: Step-through pattern     General Gait Details: ambulated around RN station x 2 with RW. No rest breaks needed. 2 instances of LOB due to B hip ER with RW catching R toe. Needed slight min assist for correction. Follows commands well.   Stairs             Wheelchair Mobility    Modified Rankin (Stroke Patients Only)       Balance Overall balance assessment: History of Falls;Needs assistance Sitting-balance support: Bilateral upper extremity supported;Feet supported Sitting balance-Leahy Scale: Good     Standing balance support: Bilateral upper extremity supported Standing balance-Leahy Scale: Fair                              Cognition Arousal/Alertness: Awake/alert Behavior During Therapy: WFL for tasks assessed/performed Overall Cognitive Status: Within Functional Limits for tasks assessed                                        Exercises Other Exercises Other Exercises: Seated/standing ther-ex performed on B LE including heel raises, alt hip abd/add, and alt marching. Also performed seated LAQ. ALl ther-ex performed x 12 reps with supervision    General Comments        Pertinent Vitals/Pain Pain Assessment: No/denies pain    Home Living                      Prior Function  PT Goals (current goals can now be found in the care plan section) Acute Rehab PT Goals Patient Stated Goal: to walk again PT Goal Formulation: With patient Time For Goal Achievement: 02/26/20 Potential to Achieve Goals: Good Progress towards PT goals: Progressing toward goals    Frequency    Min 2X/week      PT Plan Discharge plan needs to be updated    Co-evaluation              AM-PAC PT "6 Clicks" Mobility   Outcome Measure  Help needed turning from your back to your side while in a flat bed without using bedrails?: None Help needed  moving from lying on your back to sitting on the side of a flat bed without using bedrails?: None Help needed moving to and from a bed to a chair (including a wheelchair)?: A Little Help needed standing up from a chair using your arms (e.g., wheelchair or bedside chair)?: A Little Help needed to walk in hospital room?: A Little Help needed climbing 3-5 steps with a railing? : A Little 6 Click Score: 20    End of Session Equipment Utilized During Treatment: Gait belt Activity Tolerance: Patient tolerated treatment well Patient left: in bed;with bed alarm set Nurse Communication: Mobility status PT Visit Diagnosis: Unsteadiness on feet (R26.81);Muscle weakness (generalized) (M62.81);History of falling (Z91.81);Difficulty in walking, not elsewhere classified (R26.2);Pain     Time: 1426-1450 PT Time Calculation (min) (ACUTE ONLY): 24 min  Charges:  $Gait Training: 8-22 mins $Therapeutic Exercise: 8-22 mins                     Greggory Stallion, PT, DPT [phone]    Harriet Bollen 02/14/2020, 4:33 PM

## 2020-02-14 NOTE — TOC Progression Note (Signed)
Transition of Care [facility]) - Progression Note    Patient Details  Name: AZALIA NEUBERGER MRN: [id number] Date of Birth: Jan 07, 1971  [facility] CM/SW Contact  Barrie Dunker, RN Phone Number: 02/14/2020, 2:19 PM  Clinical Narrative:    Attempted to reach the patient's father [name] at [phone] and [phone], no answer, left a voice mail requesting a call back and provided my name and contact information        Expected Discharge Plan and Services                                                 Social Determinants of Health (SDOH) Interventions    Readmission Risk Interventions No flowsheet data found.

## 2020-02-14 NOTE — Progress Notes (Addendum)
   02/14/20 1727  Assess: MEWS Score  BP (!) 145/90  Pulse Rate (!) 103  Resp 16  Level of Consciousness Alert  SpO2 100 %  O2 Device Room Air  Assess: MEWS Score  MEWS Temp 0  MEWS Systolic 0  MEWS Pulse 1  MEWS RR 1  MEWS LOC 0  MEWS Score 2  MEWS Score Color Yellow  Assess: if the MEWS score is Yellow or Red  Were vital signs taken at a resting state? Yes  Focused Assessment Documented focused assessment  Early Detection of Sepsis Score *See Row Information* Low  MEWS guidelines implemented *See Row Information* No, vital signs rechecked  Upon focused assessment pt at rest in bed no distress noted.  Pt is experiencing pain to RUQ MD aware [place] scheduled tomorrow AM.  PRN Norco 5/325mg  given.

## 2020-02-14 NOTE — Progress Notes (Signed)
OT Cancellation Note  Patient Details Name: Carla Dickson MRN: [id number] DOB: 1971/07/10   Cancelled Treatment:    Reason Eval/Treat Not Completed: Other (comment). Upon attempt, pt sleeping, blankets pulled completely over pt's head. Breakfast tray untouched. Pt does not wake to cues. Will re-attempt at later date/time as pt is able to participate.   Richrd Prime, MPH, MS, OTR/L ascom [phone] 02/14/20, 9:17 AM

## 2020-02-14 NOTE — Progress Notes (Signed)
Nutrition Follow-up  DOCUMENTATION CODES:   Non-severe (moderate) malnutrition in context of social or environmental circumstances  INTERVENTION:  Continue Ensure Enlive po TID, each supplement provides 350 kcal and 20 grams of protein.  NUTRITION DIAGNOSIS:   Moderate Malnutrition related to social / environmental circumstances(inadequate oral intake, EtOH use) as evidenced by moderate fat depletion, moderate muscle depletion, severe muscle depletion.  Ongoing.  GOAL:   Patient will meet greater than or equal to 90% of their needs  Progressing.  MONITOR:   PO intake, Supplement acceptance, Labs, Weight trends, I & O's  REASON FOR ASSESSMENT:   Rounds    ASSESSMENT:   49 year old female with PMHx of HTN, EtOH use, seizures admitted with severe acute metabolic encephalopathy, severe EtOH withdrawal, also with potential subacute infarct in basal ganglia.  Met with patient at bedside. She was much more alert than last RD assessment. She reports her appetite is much better now. She is tolerating full liquids well and is finishing 100% of her trays. She is drinking 3 bottles of Ensure daily. Patient was asking when she could have solid foods. Messaged MD via secure chat and per MD okay for patient to have regular diet now.  Medications reviewed and include: folic acid 1 mg daily, Keppra, MVI daily, Protonix, thiamine 100 mg daily, NS with KCl 40 mEq/L at 75 mL/hr.  Labs reviewed: Sodium 132, Phosphorus 2.  Diet Order:   Diet Order            Diet regular Room service appropriate? Yes; Fluid consistency: Thin  Diet effective now             EDUCATION NEEDS:   No education needs have been identified at this time  Skin:  Skin Assessment: Reviewed RN Assessment  Last BM:  02/14/2020 - small type 5  Height:   Ht Readings from Last 1 Encounters:  02/03/20 '5\' 4"'$  (1.626 m)   Weight:   Wt Readings from Last 1 Encounters:  02/13/20 60.7 kg   Ideal Body Weight:      BMI:  Body mass index is 22.97 kg/m.  Estimated Nutritional Needs:   Kcal:  1500-1700  Protein:  75-85 grams  Fluid:  1.5-1.7 L/day  Jacklynn Barnacle, MS, RD, LDN Pager number available on Amion

## 2020-02-14 NOTE — TOC Progression Note (Addendum)
Transition of Care [facility]) - Progression Note    Patient Details  Name: Carla Dickson MRN: [id number] Date of Birth: 05-Mar-1971  Transition of Care [facility]) CM/SW Contact  Barrie Dunker, RN Phone Number: 02/14/2020, 2:33 PM  Clinical Narrative:    Spoke with the patient's father [name], he lives alone and has a 3 bedroom home, He is willing for the patient to come and live with him, he said he will physically be able to help her, He would like her to get [facility] services and she will need a RW and a 3 in 1,  I notified Zack at [employer] of the need for DME thru [employer], I Stacey Drain with [facility] with the referral and placed the Application in the room, the patient will get meds from Medication Mgt Called [place] with [facility] and set up with charity home health services  Expected Discharge Plan: Home w Home Health Services Barriers to Discharge: Continued Medical Work up  Expected Discharge Plan and Services Expected Discharge Plan: Home w Home Health Services   Discharge Planning Services: CM Consult   Living arrangements for the past 2 months: Single Family Home                                       Social Determinants of Health (SDOH) Interventions    Readmission Risk Interventions No flowsheet data found.

## 2020-02-14 NOTE — Progress Notes (Signed)
Pharmacy Electrolyte Monitoring Consult:   Labs:  Sodium (mmol/L)  Date Value  02/14/2020 132 (L)  10/22/2014 139   Potassium (mmol/L)  Date Value  02/14/2020 4.8  10/22/2014 3.3 (L)   Magnesium (mg/dL)  Date Value  41/75/3010 1.9   Phosphorus (mg/dL)  Date Value  40/45/9136 2.0 (L)   Calcium (mg/dL)  Date Value  85/99/2341 8.9   Calcium, Total (mg/dL)  Date Value  44/36/0165 8.6   Albumin (g/dL)  Date Value  80/02/3493 3.1 (L)  10/22/2014 4.0   Corrected Ca: 9.4 mg/dL  Assessment: [name] admitted to ICU on 02/03/2020 for seizures. Pharmacy consulted to assist in monitoring and replacing electrolytes. Patient is having improved PO intake at this time.  K 4.8, Mag 1.9, Phos 2.0, Na 132  Goals of Therapy:  Will treat to potassium ~4 and magnesium ~2.    Plan: Currently on NS with KCl 40 mEq/L at 75 mL/hr. Will order Sodium Phosphate IV once. Will follow electrolytes with AM labs, and adjust per consult.  Clovia Cuff, PharmD, BCPS 02/14/2020 11:00 AM

## 2020-02-14 NOTE — Progress Notes (Signed)
PROGRESS NOTE    Carla Dickson  MVH:[id number] DOB: 23-Sep-1971 DOA: 02/03/2020 PCP: Patient, No Pcp Per      Assessment & Plan:   Active Problems:   Acute encephalopathy   Seizure disorder (HCC)   Goals of care, counseling/discussion   Palliative care by specialist   DNR (do not resuscitate) discussion   Encounter for hospice care discussion   Malnutrition of moderate degree   Acute metabolic encephalopathy: secondary to DTs from ETOH withdrawal. Resolved  Ethanol abuse withwithdrawal: initially requiring precedex drip in ICU. Off drip and transferred to [facility] on 5/18. Continue on thiamine & folic acid. Continue CIWA. Aspiration precaution. Alcohol cessation counseling. Pt was drinking secondary to husband passing away approx 3 months ago. 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 Continue asparin, statin. 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   Generalized weakness: PT/OT recs SNF. CM is working on this as pt has no insurance   H/o seizure: continue on keppra, depakote   Normocytic anemia: likely secondary to alcohol abuse. No need for a transfusion currently. Will continue to monitor   Leukocytosis: likely reactive. Will continue to monitor   HLD: continue on statin    DVT prophylaxis: lovenox Code Status: DNR Family Communication:  Disposition Plan: likely d/c SNF. Barrier to d/c is no insurance. CM is aware and working on this    Consultants:   ICU   Procedures:    Antimicrobials:    Status is: Inpatient  Remains inpatient appropriate because:Unsafe d/c plan   Dispo: The patient is from: Home              Anticipated d/c is to: SNF              Anticipated d/c date is: 3 days              Patient currently is not medically stable to d/c.    Subjective: Pt c/o fatigue  Objective: Vitals:   02/13/20 0500 02/13/20 0741 02/13/20  1731 02/13/20 2319  BP:  (!) 147/84 139/86 (!) 148/82  Pulse:  96 97 88  Resp:  20 18 17   Temp:  98.6 F (37 C) 97.8 F (36.6 C) 98.2 F (36.8 C)  TempSrc:  Oral Oral Oral  SpO2:  100% 100% 100%  Weight: 60.7 kg     Height:        Intake/Output Summary (Last 24 hours) at 02/14/2020 0723 Last data filed at 02/14/2020 0246 Gross per 24 hour  Intake 622 ml  Output 2400 ml  Net -1778 ml   Filed Weights   02/11/20 0500 02/12/20 0500 02/13/20 0500  Weight: 61.6 kg 61.7 kg 60.7 kg    Examination:  General exam: Appears calm and comfortable  Respiratory system: Clear to auscultation. No rales, wheezes Cardiovascular system: S1 & S2+. No rubs, gallops or clicks. No pedal edema. Gastrointestinal system: Abdomen is nondistended, soft and nontender. Hypoactive bowel sounds heard. Central nervous system: Alert and oriented. Moves all 4 extremities  Psychiatry: Judgement and insight appear normal. Flat mood and affect    Data Reviewed: I have personally reviewed following labs and imaging studies  CBC: Recent Labs  Lab 02/09/20 0436 02/11/20 0553 02/12/20 0449 02/13/20 0534 02/14/20 0641  WBC 6.8 12.1* 11.6* 13.0* 15.4*  NEUTROABS 5.1 8.2* 8.6* 9.6* PENDING  HGB 13.7 10.9* 11.7* 10.1* 10.4*  HCT 40.2 31.7* 32.3* 28.5* 29.3*  MCV 97.6 96.4 90.2 92.2 91.0  PLT 98* 152 252 319 811   Basic Metabolic Panel: Recent Labs  Lab 02/09/20 0436 02/10/20 0434 02/11/20 0553 02/12/20 0449 02/13/20 0534  NA 140 139 135 132* 135  K 3.9 3.5 3.5 3.5 4.0  CL 104 106 103 98 102  CO2 29 24 24 24 25   GLUCOSE 96 82 83 114* 126*  BUN 18 18 12 8 6   CREATININE 0.62 0.55 0.45 0.44 0.46  CALCIUM 8.9 8.6* 8.6* 9.0 8.7*  MG 1.9 1.6* 2.0 1.7 1.8  PHOS 5.5* 4.0 3.4 3.3 2.6   GFR: Estimated Creatinine Clearance: 74.3 mL/min (by C-G formula based on SCr of 0.46 mg/dL). Liver Function Tests: No results for input(s): AST, ALT, ALKPHOS, BILITOT, PROT, ALBUMIN in the last 168 hours. No results  for input(s): LIPASE, AMYLASE in the last 168 hours. No results for input(s): AMMONIA in the last 168 hours. Coagulation Profile: No results for input(s): INR, PROTIME in the last 168 hours. Cardiac Enzymes: No results for input(s): CKTOTAL, CKMB, CKMBINDEX, TROPONINI in the last 168 hours. BNP (last 3 results) No results for input(s): PROBNP in the last 8760 hours. HbA1C: No results for input(s): HGBA1C in the last 72 hours. CBG: No results for input(s): GLUCAP in the last 168 hours. Lipid Profile: No results for input(s): CHOL, HDL, LDLCALC, TRIG, CHOLHDL, LDLDIRECT in the last 72 hours. Thyroid Function Tests: No results for input(s): TSH, T4TOTAL, FREET4, T3FREE, THYROIDAB in the last 72 hours. Anemia Panel: No results for input(s): VITAMINB12, FOLATE, FERRITIN, TIBC, IRON, RETICCTPCT in the last 72 hours. Sepsis Labs: No results for input(s): PROCALCITON, LATICACIDVEN in the last 168 hours.  Recent Results (from the past 240 hour(s))  Urine Culture     Status: Abnormal   Collection Time: 02/09/20  8:53 PM   Specimen: Urine, Random  Result Value Ref Range Status   Specimen Description   Final    URINE, RANDOM Performed at [facility], [address], [place], [place] [address]    Special Requests   Final    NONE Performed at [facility], [address] [place], [place] [address]    Culture (A)  Final    <10,000 COLONIES/mL INSIGNIFICANT GROWTH Performed at [facility] [address] [place], [place] [address]    Report Status 02/11/2020 FINAL  Final         Radiology Studies: No results found.      Scheduled Meds: . aspirin EC  81 mg Oral Daily  . atorvastatin  40 mg Oral Daily  . Chlorhexidine Gluconate Cloth  6 each Topical Daily  . cloNIDine  0.2 mg Oral TID  . divalproex  250 mg Oral Q8H  . enoxaparin (LOVENOX) injection  40 mg Subcutaneous Q24H  . feeding supplement (ENSURE ENLIVE)  237 mL Oral TID BM  . folic acid   1 mg Oral Daily  . levETIRAcetam  500 mg Oral BID  . multivitamin with minerals  1 tablet Oral Daily  . pantoprazole  40 mg Oral Daily  . thiamine  100 mg Oral Daily   Continuous Infusions: . sodium chloride Stopped (02/10/20 0008)  . 0.9 % NaCl with KCl 40 mEq / L 75 mL/hr (02/13/20 1857)     LOS: 11 days    Time spent: 30 mins     Wyvonnia Dusky, MD [facility] Pager [phone]  If 7PM-7AM, please contact night-coverage [email or web address] 02/14/2020, 7:23 AM

## 2020-02-15 ENCOUNTER — Inpatient Hospital Stay: Payer: Self-pay

## 2020-02-15 LAB — BASIC METABOLIC PANEL
Anion gap: 9 (ref 5–15)
BUN: 10 mg/dL (ref 6–20)
CO2: 26 mmol/L (ref 22–32)
Calcium: 9.3 mg/dL (ref 8.9–10.3)
Chloride: 99 mmol/L (ref 98–111)
Creatinine, Ser: 0.49 mg/dL (ref 0.44–1.00)
GFR calc Af Amer: >60 mL/min (ref >60)
GFR calc non Af Amer: >60 mL/min (ref >60)
Glucose, Bld: 98 mg/dL (ref 70–99)
Potassium: 3.9 mmol/L (ref 3.5–5.1)
Sodium: 134 mmol/L — ABNORMAL LOW (ref 135–145)

## 2020-02-15 LAB — CBC WITH DIFFERENTIAL/PLATELET
Abs Immature Granulocytes: 0.22 10*3/uL — ABNORMAL HIGH (ref 0.00–0.07)
Basophils Absolute: 0.1 10*3/uL (ref 0.0–0.1)
Basophils Relative: 0 %
Eosinophils Absolute: 0.1 10*3/uL (ref 0.0–0.5)
Eosinophils Relative: 1 %
HCT: 30.8 % — ABNORMAL LOW (ref 36.0–46.0)
Hemoglobin: 10.6 g/dL — ABNORMAL LOW (ref 12.0–15.0)
Immature Granulocytes: 1 %
Lymphocytes Relative: 16 %
Lymphs Abs: 2.5 10*3/uL (ref 0.7–4.0)
MCH: 32.4 pg (ref 26.0–34.0)
MCHC: 34.4 g/dL (ref 30.0–36.0)
MCV: 94.2 fL (ref 80.0–100.0)
Monocytes Absolute: 1.3 10*3/uL — ABNORMAL HIGH (ref 0.1–1.0)
Monocytes Relative: 8 %
Neutro Abs: 11.5 10*3/uL — ABNORMAL HIGH (ref 1.7–7.7)
Neutrophils Relative %: 74 %
Platelets: 410 10*3/uL — ABNORMAL HIGH (ref 150–400)
RBC: 3.27 MIL/uL — ABNORMAL LOW (ref 3.87–5.11)
RDW: 16.3 % — ABNORMAL HIGH (ref 11.5–15.5)
WBC: 15.7 10*3/uL — ABNORMAL HIGH (ref 4.0–10.5)
nRBC: 0 % (ref 0.0–0.2)

## 2020-02-15 LAB — PHOSPHORUS: Phosphorus: 4.2 mg/dL (ref 2.5–4.6)

## 2020-02-15 LAB — MAGNESIUM: Magnesium: 1.7 mg/dL (ref 1.7–2.4)

## 2020-02-15 MED ORDER — BISACODYL 5 MG PO TBEC: 5.0000 mg | DELAYED_RELEASE_TABLET | Freq: Every day | ORAL | Status: DC | PRN

## 2020-02-15 MED ORDER — DIVALPROEX SODIUM 125 MG PO CSDR: 250.0000 mg | DELAYED_RELEASE_CAPSULE | Freq: Three times a day (TID) | ORAL | 0 refills | Status: AC

## 2020-02-15 MED ORDER — SERTRALINE HCL 50 MG PO TABS: 25.0000 mg | ORAL_TABLET | Freq: Every day | ORAL | Status: DC

## 2020-02-15 MED ORDER — SERTRALINE HCL 25 MG PO TABS: 25.0000 mg | ORAL_TABLET | Freq: Every day | ORAL | 1 refills | Status: AC

## 2020-02-15 MED ORDER — MAGNESIUM SULFATE 2 GM/50ML IV SOLN: 2.0000 g | Freq: Once | INTRAVENOUS | Status: DC

## 2020-02-15 MED ORDER — OLANZAPINE 5 MG PO TABS: 2.5000 mg | ORAL_TABLET | Freq: Every day | ORAL | Status: DC

## 2020-02-15 MED ORDER — ASPIRIN 81 MG PO TBEC: 81.0000 mg | DELAYED_RELEASE_TABLET | Freq: Every day | ORAL | 0 refills | Status: AC

## 2020-02-15 MED ORDER — POTASSIUM & SODIUM PHOSPHATES 280-160-250 MG PO PACK
1.0000 | PACK | Freq: Three times a day (TID) | ORAL | Status: DC
  Administered 2020-02-15: 1 via ORAL
  Filled 2020-02-15 (×4): qty 1

## 2020-02-15 MED ORDER — PANTOPRAZOLE SODIUM 40 MG PO TBEC: 40.0000 mg | DELAYED_RELEASE_TABLET | Freq: Every day | ORAL | 0 refills | Status: AC

## 2020-02-15 MED ORDER — OLANZAPINE 2.5 MG PO TABS: 2.5000 mg | ORAL_TABLET | Freq: Every day | ORAL | 1 refills | Status: AC

## 2020-02-15 MED ORDER — ATORVASTATIN CALCIUM 40 MG PO TABS: 40.0000 mg | ORAL_TABLET | Freq: Every day | ORAL | 0 refills | Status: AC

## 2020-02-15 MED ORDER — DOCUSATE SODIUM 100 MG PO CAPS: 200.0000 mg | ORAL_CAPSULE | Freq: Two times a day (BID) | ORAL | Status: DC

## 2020-02-15 MED ORDER — LEVETIRACETAM 500 MG PO TABS: 500.0000 mg | ORAL_TABLET | Freq: Two times a day (BID) | ORAL | 0 refills | Status: AC

## 2020-02-15 NOTE — Progress Notes (Signed)
Discharge summary reviewed with verbal understanding. 

## 2020-02-15 NOTE — TOC Transition Note (Signed)
Transition of Care [facility]) - Progression Note    Patient Details  Name: Carla Dickson MRN: [id number] Date of Birth: 08/18/1971  [facility]) CM/SW Contact  Carla Dunker, RN Phone Number: 02/15/2020, 10:10 AM  Clinical Narrative:     The patient is to DC home today to her dad's house, Carla Dickson is set up for [facility] and a 3 in 1 and Rolling walker is in the room to take home with her provided by [employer].  The patient stated that she does not need anything else at this time, she is [facility] for [facility]CP and she will get medications from med mgt  Expected Discharge Plan: Home w Home Health Services Barriers to Discharge: Continued Medical Work up  Expected Discharge Plan and Services Expected Discharge Plan: Home w Home Health Services   Discharge Planning Services: CM Consult   Living arrangements for the past 2 months: Single Family Home Expected Discharge Date: 02/15/20                                     Social Determinants of Health (SDOH) Interventions    Readmission Risk Interventions No flowsheet data found.

## 2020-02-15 NOTE — Progress Notes (Signed)
Pharmacy Electrolyte Monitoring Consult:   Labs:  Sodium (mmol/L)  Date Value  02/15/2020 134 (L)  10/22/2014 139   Potassium (mmol/L)  Date Value  02/15/2020 3.9  10/22/2014 3.3 (L)   Magnesium (mg/dL)  Date Value  51/10/5850 1.7   Phosphorus (mg/dL)  Date Value  77/82/4235 4.2   Calcium (mg/dL)  Date Value  36/14/4315 9.3   Calcium, Total (mg/dL)  Date Value  40/04/6760 8.6   Albumin (g/dL)  Date Value  95/05/3266 3.1 (L)  10/22/2014 4.0   Corrected Ca: 9.4 mg/dL  Assessment: 12W.o. female admitted to ICU on 02/03/2020 for seizures. Pharmacy consulted to assist in monitoring and replacing electrolytes. Patient is having improved PO intake at this time.  K 3.9, Mag 1.7, Phos 4.2, Na 134  Goals of Therapy:  Will treat to potassium ~4 and magnesium ~2.    Plan: Will replace Mag with Mag Sulfate 2g IV once. Patient with discharge orders. Will follow electrolytes with AM labs if patient is not discharged, and adjust per consult.  Clovia Cuff, PharmD, BCPS 02/15/2020 10:49 AM

## 2020-02-15 NOTE — Consult Note (Addendum)
[facility] Face-to-Face Psychiatry Consult   CC:  I am trying to get things together    Reason for Consult:  ETOH depression bereavement  Referring Physician: Hospitalist group  Patient Identification: Carla Dickson MRN:  [id number] Principal Diagnosis: <principal problem not specified> Diagnosis:  Active Problems:   Acute encephalopathy   Seizure disorder (HCC)   Goals of care, counseling/discussion   Palliative care by specialist   DNR (do not resuscitate) discussion   Encounter for hospice care discussion   Malnutrition of moderate degree  Major depression severe recurrent Bereavement  ETOH dependence     Total Time spent with patient:   2-40 min plus add'l hour   Subjective:   Carla Dickson is a 49 y.o. female patient admitted with  ETOH withdrawal, DT's and management /history of seizures and CVA in past .  HPI:  49 year old AA female with history of ETOH dependence, major depression and bereavement   Approx 3 months ago lost her husband who passed away.  ETOH has been increasing to the point of intoxication and need for withdrawal mgt on medical floor  Now stable pending SNF and then home  To Dad's house.  She has been completely dependent on husband and has no disability --cannot stay at her own home  Has a long history of ETOh with no recent recovery, AA NA or related programming   Poor insight and judgement   She has major depression recurrent with depressed mood crying spells, lack of energy motivation concentration and energy and enthusiasm, anhedonia, weight loss, lack of drive low self esteem.  Lack of sleep of three cycles.  No active SI HI or plans.  No psychosis or related mania but has irritability edginess and frustration  She has generalized anxiety with anxious mood, dread, doom fear, frozen and numb feelings,somatic and autonomic symptoms, ---  Now needs med mgt before SNF transfer       Past Psychiatric History:   Risk to Self:  none Risk  to Others: none   Prior Inpatient Therapy:   nonePrior Outpatient Therapy:  none   Poor followup and insight  No bereavement groups avoidant   Past Medical History:  Past Medical History:  Diagnosis Date  . Alcohol use   . Fracture of fifth metacarpal bone of left hand with routine healing   . Hypercholesteremia   . Hypertension   . Reflux   . Seizures (HCC)   . 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 ABDOMINAL HYSTERECTOMY     Family History:  Family History  Problem Relation Age of Onset  . Breast cancer Maternal Grandmother    Family Psychiatric  History:  None she says Social History:   Recently her husband passed away and she is in grief coping with ----the depression via self medication with alcohol   Unclear if she will do recovery and all after SNF but will live with Dad   No disability, skills, income or motivation  Social History   Substance and Sexual Activity  Alcohol Use Yes  . Alcohol/week: 26.0 standard drinks  . Types: 2 Cans of beer, 24 Shots of liquor per week   Comment: 2-3 drinks liquor per day     Social History   Substance and Sexual Activity  Drug Use No    Social History   Socioeconomic History  . Marital status: Widowed  Spouse name: Not on file  . Number of children: Not on file  . Years of education: Not on file  . Highest education level: Not on file  Occupational History  . Not on file  Tobacco Use  . Smoking status: Current Every Day Smoker    Packs/day: 0.50    Types: Cigarettes  . Smokeless tobacco: Never Used  Substance and Sexual Activity  . Alcohol use: Yes    Alcohol/week: 26.0 standard drinks    Types: 2 Cans of beer, 24 Shots of liquor per week    Comment: 2-3 drinks liquor per day  . Drug use: No  . Sexual activity: Not on file  Other Topics Concern  . Not on file  Social History Narrative  . Not on file    Social Determinants of Health   Financial Resource Strain:   . Difficulty of Paying Living Expenses:   Food Insecurity:   . Worried About Charity fundraiser in the Last Year:   . Arboriculturist in the Last Year:   Transportation Needs:   . Film/video editor (Medical):   Marland Kitchen Lack of Transportation (Non-Medical):   Physical Activity:   . Days of Exercise per Week:   . Minutes of Exercise per Session:   Stress:   . Feeling of Stress :   Social Connections:   . Frequency of Communication with Friends and Family:   . Frequency of Social Gatherings with Friends and Family:   . Attends Religious Services:   . Active Member of Clubs or Organizations:   . Attends Archivist Meetings:   Marland Kitchen Marital Status:    Additional Social History:    Allergies:  No Known Allergies  Labs:  Results for orders placed or performed during the hospital encounter of 02/03/20 (from the past 48 hour(s))  Magnesium     Status: None   Collection Time: 02/14/20  6:41 AM  Result Value Ref Range   Magnesium 1.9 1.7 - 2.4 mg/dL    Comment: Performed at [facility], [address] [place], [place] [address]  Phosphorus     Status: Abnormal   Collection Time: 02/14/20  6:41 AM  Result Value Ref Range   Phosphorus 2.0 (L) 2.5 - 4.6 mg/dL    Comment: Performed at [facility], [address] [place], [place] [address]  CBC with Differential/Platelet     Status: Abnormal   Collection Time: 02/14/20  6:41 AM  Result Value Ref Range   WBC 15.4 (H) 4.0 - 10.5 K/uL   RBC 3.22 (L) 3.87 - 5.11 MIL/uL   Hemoglobin 10.4 (L) 12.0 - 15.0 g/dL   HCT 29.3 (L) 36.0 - 46.0 %   MCV 91.0 80.0 - 100.0 fL   MCH 32.3 26.0 - 34.0 pg   MCHC 35.5 30.0 - 36.0 g/dL   RDW 16.5 (H) 11.5 - 15.5 %   Platelets 359 150 - 400 K/uL   nRBC 0.0 0.0 - 0.2 %   Neutrophils Relative % 77 %   Neutro Abs 11.8 (H) 1.7 - 7.7 K/uL   Lymphocytes Relative 14 %   Lymphs Abs 2.2 0.7 - 4.0 K/uL   Monocytes  Relative 7 %   Monocytes Absolute 1.1 (H) 0.1 - 1.0 K/uL   Eosinophils Relative 1 %   Eosinophils Absolute 0.1 0.0 - 0.5 K/uL   Basophils Relative 0 %   Basophils Absolute 0.0 0.0 - 0.1 K/uL   WBC Morphology MORPHOLOGY  UNREMARKABLE    RBC Morphology MORPHOLOGY UNREMARKABLE    Smear Review Normal platelet morphology    Immature Granulocytes 1 %   Abs Immature Granulocytes 0.10 (H) 0.00 - 0.07 K/uL    Comment: Performed at [facility], [address] [place], [place] [address]  Basic metabolic panel     Status: Abnormal   Collection Time: 02/14/20  6:41 AM  Result Value Ref Range   Sodium 132 (L) 135 - 145 mmol/L   Potassium 4.8 3.5 - 5.1 mmol/L   Chloride 101 98 - 111 mmol/L   CO2 25 22 - 32 mmol/L   Glucose, Bld 92 70 - 99 mg/dL    Comment: Glucose reference range applies only to samples taken after fasting for at least 8 hours.   BUN 8 6 - 20 mg/dL   Creatinine, Ser 6.29 0.44 - 1.00 mg/dL   Calcium 8.9 8.9 - 52.8 mg/dL   GFR calc non Af Amer >60 >60 mL/min   GFR calc Af Amer >60 >60 mL/min   Anion gap 6 5 - 15    Comment: Performed at [facility], [address] [place], [place] [address]  Magnesium     Status: None   Collection Time: 02/15/20  7:24 AM  Result Value Ref Range   Magnesium 1.7 1.7 - 2.4 mg/dL    Comment: Performed at [facility], [address] [place], [place] [address]  Phosphorus     Status: None   Collection Time: 02/15/20  7:24 AM  Result Value Ref Range   Phosphorus 4.2 2.5 - 4.6 mg/dL    Comment: Performed at [facility], [address] [place], [place] [address]  CBC with Differential/Platelet     Status: Abnormal   Collection Time: 02/15/20  7:24 AM  Result Value Ref Range   WBC 15.7 (H) 4.0 - 10.5 K/uL   RBC 3.27 (L) 3.87 - 5.11 MIL/uL   Hemoglobin 10.6 (L) 12.0 - 15.0 g/dL   HCT 64.4 (L) 03.4 - 74.2 %   MCV 94.2 80.0 - 100.0 fL   MCH 32.4 26.0 - 34.0 pg   MCHC 34.4 30.0 - 36.0 g/dL   RDW 59.5  (H) 63.8 - 15.5 %   Platelets 410 (H) 150 - 400 K/uL   nRBC 0.0 0.0 - 0.2 %   Neutrophils Relative % 74 %   Neutro Abs 11.5 (H) 1.7 - 7.7 K/uL   Lymphocytes Relative 16 %   Lymphs Abs 2.5 0.7 - 4.0 K/uL   Monocytes Relative 8 %   Monocytes Absolute 1.3 (H) 0.1 - 1.0 K/uL   Eosinophils Relative 1 %   Eosinophils Absolute 0.1 0.0 - 0.5 K/uL   Basophils Relative 0 %   Basophils Absolute 0.1 0.0 - 0.1 K/uL   Immature Granulocytes 1 %   Abs Immature Granulocytes 0.22 (H) 0.00 - 0.07 K/uL    Comment: Performed at [facility], [address], [place], [place] [address]  Basic metabolic panel     Status: Abnormal   Collection Time: 02/15/20  7:24 AM  Result Value Ref Range   Sodium 134 (L) 135 - 145 mmol/L   Potassium 3.9 3.5 - 5.1 mmol/L   Chloride 99 98 - 111 mmol/L   CO2 26 22 - 32 mmol/L   Glucose, Bld 98 70 - 99 mg/dL    Comment: Glucose reference range applies only to samples taken after fasting for at least 8 hours.   BUN  10 6 - 20 mg/dL   Creatinine, Ser 1.610.49 0.44 - 1.00 mg/dL   Calcium 9.3 8.9 - 09.610.3 mg/dL   GFR calc non Af Amer >60 >60 mL/min   GFR calc Af Amer >60 >60 mL/min   Anion gap 9 5 - 15    Comment: Performed at [facility], [address] [place], [place] [id number]    Current Facility-Administered Medications  Medication Dose Route Frequency Provider Last Rate Last Admin  . 0.9 %  sodium chloride infusion   Intravenous PRN Erin FullingKasa, Kurian, MD 10 mL/hr at 02/14/20 1422 250 mL at 02/14/20 1422  . acetaminophen (TYLENOL) tablet 650 mg  650 mg Oral Q6H PRN Delfino LovettShah, Vipul, MD   650 mg at 02/14/20 2328  . albuterol (PROVENTIL) (2.5 MG/3ML) 0.083% nebulizer solution 2.5 mg  2.5 mg Nebulization Q2H PRN Salena SanerGonzalez, Carmen L, MD      . aspirin EC tablet 81 mg  81 mg Oral Daily Enedina FinnerPatel, Sona, MD   81 mg at 02/14/20 0817  . atorvastatin (LIPITOR) tablet 40 mg  40 mg Oral Daily Enedina FinnerPatel, Sona, MD   40 mg at 02/14/20 0817  . bisacodyl (DULCOLAX) EC tablet 5 mg  5 mg  Oral Daily PRN Charise KillianWilliams, Jamiese M, MD      . Chlorhexidine Gluconate Cloth 2 % PADS 6 each  6 each Topical Daily Salena SanerGonzalez, Carmen L, MD   6 each at 02/14/20 1107  . cloNIDine (CATAPRES) tablet 0.2 mg  0.2 mg Oral TID Vida RiggerAleskerov, Fuad, MD   0.2 mg at 02/14/20 2254  . divalproex (DEPAKOTE SPRINKLE) capsule 250 mg  250 mg Oral Q8H Aleskerov, Fuad, MD   250 mg at 02/15/20 0651  . docusate sodium (COLACE) capsule 200 mg  200 mg Oral BID Charise KillianWilliams, Jamiese M, MD      . enoxaparin (LOVENOX) injection 40 mg  40 mg Subcutaneous Q24H Erin FullingKasa, Kurian, MD   40 mg at 02/14/20 0818  . feeding supplement (ENSURE ENLIVE) (ENSURE ENLIVE) liquid 237 mL  237 mL Oral TID BM Erin FullingKasa, Kurian, MD   237 mL at 02/14/20 2045  . folic acid (FOLVITE) tablet 1 mg  1 mg Oral Daily Erin FullingKasa, Kurian, MD   1 mg at 02/14/20 0816  . HYDROcodone-acetaminophen (NORCO/VICODIN) 5-325 MG per tablet 1 tablet  1 tablet Oral Q6H PRN Charise KillianWilliams, Jamiese M, MD   1 tablet at 02/14/20 1801  . levETIRAcetam (KEPPRA) tablet 500 mg  500 mg Oral BID Vida RiggerAleskerov, Fuad, MD   500 mg at 02/14/20 2255  . LORazepam (ATIVAN) injection 2 mg  2 mg Intravenous Q4H PRN Erin FullingKasa, Kurian, MD   2 mg at 02/13/20 0202  . magnesium sulfate IVPB 2 g 50 mL  2 g Intravenous Once Charise KillianWilliams, Jamiese M, MD      . multivitamin with minerals tablet 1 tablet  1 tablet Oral Daily Vida RiggerAleskerov, Fuad, MD   1 tablet at 02/14/20 0816  . ondansetron (ZOFRAN) injection 4 mg  4 mg Intravenous Q6H PRN Salena SanerGonzalez, Carmen L, MD   4 mg at 02/10/20 1224  . pantoprazole (PROTONIX) EC tablet 40 mg  40 mg Oral Daily Erin FullingKasa, Kurian, MD   40 mg at 02/14/20 0817  . polyethylene glycol (MIRALAX / GLYCOLAX) packet 17 g  17 g Oral Daily PRN Salena SanerGonzalez, Carmen L, MD   17 g at 02/14/20 1054  . potassium & sodium phosphates (PHOS-NAK) 280-160-250 MG packet 1 packet  1 packet Oral TID WC & HS Charise KillianWilliams, Jamiese M, MD      .  sodium chloride flush (NS) 0.9 % injection 10-40 mL  10-40 mL Intracatheter PRN Salena Saner, MD       . thiamine tablet 100 mg  100 mg Oral Daily Erin Fulling, MD   100 mg at 02/14/20 0818   Mental status   Alert somewhat cooperative poor rapport poor eye contact concentration and attention fair  Low energy, not motivated Appearance much older than stated age haggard unkept  Mood depressed affect constricted Anxious  No shakes tremors or other movement problems SI and HI contracts for safety  Judgement insight reliability all poor  Memory ---does okay with immediate remote and recent memory questions Fund of knowledge needs to improve Intelligence average to below average  Thought process and content ---no frank psychosis, mania LOA FOI voices, delusions hallucinations and related thought Remains vague and generalized avoidant   Speech --low tone volume rate                     Treatment Plan Summary:   Will add basic antidepressants for anxiety and depression and low dose Zyprexa also for sleep and mood stabilization  She agrees to this   Disposition:  SNF but psych meds added.  Wants to go back to dad to live after     Roselind Messier, MD 02/15/2020 10:06 AM

## 2020-02-15 NOTE — Progress Notes (Signed)
Called pt's dad [name] for transportation at discharge.

## 2020-02-15 NOTE — Discharge Summary (Signed)
Physician Discharge Summary  Carla Dickson:[id number] DOB: 08/23/1971 DOA: 02/03/2020  PCP: Patient, No Pcp Per  Admit date: 02/03/2020 Discharge date: 02/15/2020  Admitted From: home  Disposition:  Home   Recommendations for Outpatient Follow-up:  1. Follow up with PCP in 1-2 weeks   Home Health: yes Equipment/Devices: 3N1, walker  Discharge Condition: stable CODE STATUS: DNR Diet recommendation: Heart Healthy  Brief/Interim Summary: HPI was taken from Dr. Jayme Cloud: This is a 49 year old woman the as noted below including seizure disorder and alcohol abuse who presented via EMS after she was found on the floor at home unresponsive.  The patient is noted to have abrasions and laceration on the right cheek and a hematoma on the right side of her face.  She has ecchymoses under her left eye.  It was presumed she had a fall.  Patient's family was present when EMS presented to the home.  They stated that she has had previous seizures from alcohol withdrawal previously.  Onset of seizures was approximately a little over a year ago.  Patient is no seizure medications.  In the emergency room the patient is noted to be for the most part unresponsive she did become agitated and required Ativan up to 8 mg IV.  She is currently in the fetal position and will not interact.  She was noted to have a fever and possible urinary tract infection and antibiotics have been started mainly vancomycin and Rocephin.  She has had E. coli UTI previously.  She had a similar presentation in January of this year.  She was admitted by our group in similar circumstance.  At that time LP was considered however, the patient's combativeness precluded doing this.  The patient at that time left AMA after she improved.  She is apparently not taking any medications at home.  Work-up in the emergency room has shown laboratory data as below.  CT scan of the head shows a possible new focus of indeterminate age infarct is low  ganglia.  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  C-spine was cleared.  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  Urinalysis appears to be consistent with UTI.  Remainder of laboratory data is either pending or unremarkable.  Drug screen was positive for opiates alcohol level less than 10.  Patient received vancomycin x1 and Rocephin x1 in the ED.  She was loaded with Keppra.  Hospital course from Dr. Wilfred Lacy 5/19-5/21/21: Pt was found to have DTs from alcohol w/drawal and initially admitted to the ICU. Pt was initially put on precedex which has since been weaned off. Pt also received folate and thiamine. Pt did receive alcohol cessation counseling. Of note, pt was found to have acute/sub-acute CVA that was treated w/ aspirin & statin. PT/OT saw the pt and recommended SNF but pt does not have any insurance so CM was unable to get the pt accepted to a SNF. Pt was d/c home to stay with her father and home health. This was discussed w/ the pt's father. Please see previous progress notes for more information.   Discharge Diagnoses:  Active Problems:   Acute encephalopathy   Seizure disorder (HCC)   Goals of care, counseling/discussion   Palliative care by specialist   DNR (do not resuscitate) discussion   Encounter for hospice care discussion   Malnutrition of moderate degree  Acute metabolic encephalopathy: secondary to DTs from ETOH withdrawal. Resolved  Ethanol abuse withwithdrawal: initially requiring precedex drip in ICU. Off drip and transferred to [facility] on 5/18. Continue on thiamine & folic acid. Continue CIWA. Aspirationprecaution. Alcohol cessation counseling. Pt was drinking secondary to husband passing away approx 3 months ago (as per pt). 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 Continue asparin, statin. 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  Generalized weakness: PT/OT recs SNF. Pt has no insurance   H/o seizure: continue on keppra, depakote   Normocytic anemia: likely secondary to alcohol abuse. No need for a transfusion currently. Will continue to monitor   Leukocytosis: likely reactive. Will continue to monitor   HLD: continue on statin    Discharge Instructions  Discharge Instructions    Diet - low sodium heart healthy   Complete by: As directed    Discharge instructions   Complete by: As directed    F/u PCP in 1 week   Increase activity slowly   Complete by: As directed      Allergies as of 02/15/2020   No Known Allergies     Medication List    TAKE these medications   aspirin 81 MG EC tablet Take 1 tablet (81 mg total) by mouth daily.   atorvastatin 40 MG tablet Commonly known as: LIPITOR Take 1 tablet (40 mg total) by mouth daily.   divalproex 125 MG capsule Commonly known as: DEPAKOTE SPRINKLE Take 2 capsules (250 mg total) by mouth every 8 (eight) hours.   levETIRAcetam 500 MG tablet Commonly known as: KEPPRA Take 1 tablet (500 mg total) by mouth 2 (two) times daily.   OLANZapine 2.5 MG tablet Commonly known as: ZYPREXA Take 1 tablet (2.5 mg total) by mouth at bedtime.   pantoprazole 40 MG tablet Commonly known as: PROTONIX Take 1 tablet (40 mg total) by mouth daily.   sertraline 25 MG tablet Commonly known as: ZOLOFT Take 1 tablet (25 mg total) by mouth daily.            Durable Medical Equipment  (From admission, onward)         Start     Ordered   02/14/20 1435  For home use only DME 3 n 1  Once     02/14/20 1435   02/14/20 1435  For home use only DME Walker rolling  Once    Question Answer Comment  Walker: With 5 Inch Wheels   Patient needs a walker to treat with the following condition Generalized weakness      02/14/20 1435          No Known  Allergies  Consultations:  ICU   Procedures/Studies: EEG  Result Date: 02/04/2020 Alexis Goodell, MD     02/04/2020  3:09 PM ELECTROENCEPHALOGRAM REPORT Patient: Carla Dickson       Room #: IC12A-AA EEG No. ID: [id number] Age: 49 y.o.        Sex: female Requesting Physician: Patsey Berthold Report Date:  02/04/2020       Interpreting Physician: Alexis Goodell History: Carla Dickson is an 49 y.o. female with altered mental status Medications: Precedex, Ceftriaxone, Keppra, Lorazepam, Thiiamine Conditions of Recording:  This is a 21 channel routine scalp EEG performed with bipolar and monopolar montages arranged in accordance to the international 10/20 system of electrode placement. One channel was dedicated to EKG recording. The patient is in the awake, drowsy and asleep states. Description:  The patient appeared  awake during the initial portion of the recording.  During this time artifact and beta activity dominate the tracing.  With drowse the background is noted to be slowed.  There is evidence of light sleep during the recording as well with symmetrical sleep spindles, vertex central sharp transients and irregular slow activity.  No epileptiform activity is noted.  Hyperventilation was not performed.  Intermittent photic stimulation was performed but failed to illicit any change in the tracing.  IMPRESSION: This findings on this electroencephalogram are consistent with the medications being administered.  No epileptiform activity is noted.  Thana FarrLeslie Reynolds, MD Neurology [phone] 02/04/2020, 3:05 PM   CT Head Wo Contrast  Result Date: 02/03/2020 CLINICAL DATA:  Seizure with head trauma. EXAM: CT HEAD WITHOUT CONTRAST TECHNIQUE: Contiguous axial images were obtained from the base of the skull through the vertex without intravenous contrast. COMPARISON:  October 04, 2019 FINDINGS: Brain: No evidence of acute hemorrhage, hydrocephalus, extra-axial collection or mass lesion/mass effect. New focus of  hypoattenuation in the left basal ganglia. 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 Stable moderate bilateral parietal lobe atrophy. Mild periventricular white matter microvascular disease. Vascular: No hyperdense vessel or unexpected calcification. Skull: Normal. Negative for fracture or focal lesion. Sinuses/Orbits: No acute finding. Other: None. IMPRESSION: 1. New focus of hypoattenuation in the left basal ganglia, which may represent an age-indeterminate infarct/asymmetric micro vascular disease. 2. 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 Smaller lacunar infarcts demonstrated by prior MRI are not seen on today's exam. 3. Mild periventricular white matter microvascular disease and parenchymal volume loss. 4. No evidence of intracranial hemorrhage. Electronically Signed   By: Ted Mcalpineobrinka  Dimitrova M.D.   On: 02/03/2020 14:29   CT Cervical Spine Wo Contrast  Result Date: 02/03/2020 CLINICAL DATA:  Found on the floor. Suspected seizure activity. EXAM: CT CERVICAL SPINE WITHOUT CONTRAST TECHNIQUE: Multidetector CT imaging of the cervical spine was performed without intravenous contrast. Multiplanar CT image reconstructions were also generated. COMPARISON:  Pain 12/15/2018 FINDINGS: Alignment: Straightening of the cervical lordosis. Skull base and vertebrae: No acute fracture. No primary bone lesion or focal pathologic process. Soft tissues and spinal canal: No prevertebral fluid or swelling. No visible canal hematoma. Disc levels:  Multilevel osteoarthritic changes. Upper chest: Negative. Other: None. IMPRESSION: 1. No evidence of acute traumatic injury to the cervical spine. 2. Multilevel osteoarthritic changes of the cervical spine. Electronically Signed   By: Ted Mcalpineobrinka  Dimitrova M.D.   On: 02/03/2020 14:32   DG Chest Portable 1 View  Result Date: 02/03/2020 CLINICAL DATA:  Seizure. EXAM: PORTABLE CHEST 1 VIEW COMPARISON:  October 04, 2019 FINDINGS: The heart  size and mediastinal contours are within normal limits. Both lungs are clear. The visualized skeletal structures are unremarkable. IMPRESSION: No active disease. Electronically Signed   By: Aram Candelahaddeus  Houston M.D.   On: 02/03/2020 15:26   CT Maxillofacial Wo Contrast  Result Date: 02/03/2020 CLINICAL DATA:  Status post fall. Suspected seizure activity. EXAM: CT MAXILLOFACIAL WITHOUT CONTRAST TECHNIQUE: Multidetector CT imaging of the maxillofacial structures was performed. Multiplanar CT image reconstructions were also generated. COMPARISON:  Jan 28, 2019 FINDINGS: Osseous: No fracture or mandibular dislocation. No destructive process. Orbits: Negative. No traumatic or inflammatory finding. Sinuses: Findings of chronic left maxillary sinusitis with bony remodeling and chronic mucosal thickening. Soft tissues: Mild right-sided facial bruising. Limited intracranial: No significant or unexpected finding. IMPRESSION: 1. No evidence of facial fractures. 2. Findings of chronic left maxillary sinusitis. Electronically Signed   By: Sharlyne Pacasobrinka  Dimitrova M.D.   On: 02/03/2020 14:37   US Abdomen Limited RUQ  Result Date: 02/15/2020 CLINICAL DATA:  Right upper quadrant pain. EXAM: ULTRASOUND ABDOMEN LIMITED RIGHT UPPER QUADRANT COMPARISON:  01/28/2019. FINDINGS: Gallbladder: No gallstones or wall thickening visualized. No sonographic Murphy sign noted by sonographer. Common bile duct: Diameter: 3.2 mm Liver: Increased echogenicity consistent fatty infiltration or hepatocellular disease. No focal hepatic abnormality identified. Portal vein is patent on color Doppler imaging with normal direction of blood flow towards the liver. Other: None. IMPRESSION: 1. Increased hepatic echogenicity consistent with fatty infiltration or hepatocellular disease. 2.  No gallstones or biliary distention. Electronically Signed   By: Maisie Fus  Register   On: 02/15/2020 09:12      Subjective: Pt c/o fatigue   Discharge Exam: Vitals:    02/15/20 0722 02/15/20 1243  BP: (!) 147/80 138/85  Pulse: 95 98  Resp: 16 17  Temp: 98.2 F (36.8 C) 98.4 F (36.9 C)  SpO2: 100% 98%   Vitals:   02/14/20 2251 02/14/20 2305 02/15/20 0722 02/15/20 1243  BP: 114/73 103/72 (!) 147/80 138/85  Pulse: 93 100 95 98  Resp:  17 16 17   Temp:  98.8 F (37.1 C) 98.2 F (36.8 C) 98.4 F (36.9 C)  TempSrc:  Oral Oral   SpO2:  100% 100% 98%  Weight:      Height:        General: Pt is alert, awake, not in acute distress Cardiovascular:  S1/S2 +, no rubs, no gallops Respiratory: decreased breath sounds b/l otherwise clear Abdominal: Soft, NT, ND, bowel sounds + Extremities: no edema, no cyanosis    The results of significant diagnostics from this hospitalization (including imaging, microbiology, ancillary and laboratory) are listed below for reference.     Microbiology: Recent Results (from the past 240 hour(s))  Urine Culture     Status: Abnormal   Collection Time: 02/09/20  8:53 PM   Specimen: Urine, Random  Result Value Ref Range Status   Specimen Description   Final    URINE, RANDOM Performed at [facility], [address], [place], [place]    Special Requests   Final    NONE Performed at [facility], [address], [place], [place]    Culture (A)  Final    <10,000 COLONIES/mL INSIGNIFICANT GROWTH Performed at [facility], [address] [place], [place]    Report Status 02/11/2020 FINAL  Final     Labs: BNP (last 3 results) No results for input(s): BNP in the last 8760 hours. Basic Metabolic Panel: Recent Labs  Lab 02/11/20 0553 02/12/20 0449 02/13/20 0534 02/14/20 0641 02/15/20 0724  NA 135 132* 135 132* 134*  K 3.5 3.5 4.0 4.8 3.9  CL 103 98 102 101 99  CO2 24 24 25 25 26   GLUCOSE 83 114* 126* 92 98  BUN 12 8 6 8 10   CREATININE 0.45 0.44 0.46 0.46 0.49  CALCIUM 8.6* 9.0 8.7* 8.9 9.3  MG 2.0 1.7 1.8 1.9 1.7  PHOS 3.4 3.3 2.6 2.0* 4.2    Liver Function Tests: No results for input(s): AST, ALT, ALKPHOS, BILITOT, PROT, ALBUMIN in the last 168 hours. No results for input(s): LIPASE, AMYLASE in the last 168 hours. No results for input(s): AMMONIA in the last 168 hours. CBC: Recent Labs  Lab 02/11/20 0553 02/12/20 0449 02/13/20 0534 02/14/20 0641 02/15/20 0724  WBC 12.1* 11.6* 13.0* 15.4* 15.7*  NEUTROABS 8.2* 8.6* 9.6* 11.8*  11.5*  HGB 10.9* 11.7* 10.1* 10.4* 10.6*  HCT 31.7* 32.3* 28.5* 29.3* 30.8*  MCV 96.4 90.2 92.2 91.0 94.2  PLT 152 252 319 359 410*   Cardiac Enzymes: No results for input(s): CKTOTAL, CKMB, CKMBINDEX, TROPONINI in the last 168 hours. BNP: Invalid input(s): POCBNP CBG: No results for input(s): GLUCAP in the last 168 hours. D-Dimer No results for input(s): DDIMER in the last 72 hours. Hgb A1c No results for input(s): HGBA1C in the last 72 hours. Lipid Profile No results for input(s): CHOL, HDL, LDLCALC, TRIG, CHOLHDL, LDLDIRECT in the last 72 hours. Thyroid function studies No results for input(s): TSH, T4TOTAL, T3FREE, THYROIDAB in the last 72 hours.  Invalid input(s): FREET3 Anemia work up No results for input(s): VITAMINB12, FOLATE, FERRITIN, TIBC, IRON, RETICCTPCT in the last 72 hours. Urinalysis    Component Value Date/Time   COLORURINE AMBER (A) 02/09/2020 2053   APPEARANCEUR CLOUDY (A) 02/09/2020 2053   APPEARANCEUR Clear 10/22/2014 1128   LABSPEC 1.027 02/09/2020 2053   LABSPEC 1.017 10/22/2014 1128   PHURINE 5.0 02/09/2020 2053   GLUCOSEU 50 (A) 02/09/2020 2053   GLUCOSEU Negative 10/22/2014 1128   HGBUR MODERATE (A) 02/09/2020 2053   BILIRUBINUR SMALL (A) 02/09/2020 2053   BILIRUBINUR Negative 10/22/2014 1128   KETONESUR 5 (A) 02/09/2020 2053   PROTEINUR 100 (A) 02/09/2020 2053   NITRITE NEGATIVE 02/09/2020 2053   LEUKOCYTESUR TRACE (A) 02/09/2020 2053   LEUKOCYTESUR Negative 10/22/2014 1128   Sepsis Labs Invalid input(s): PROCALCITONIN,  WBC,   LACTICIDVEN Microbiology Recent Results (from the past 240 hour(s))  Urine Culture     Status: Abnormal   Collection Time: 02/09/20  8:53 PM   Specimen: Urine, Random  Result Value Ref Range Status   Specimen Description   Final    URINE, RANDOM Performed at [facility], [address] [place], [place] [address]    Special Requests   Final    NONE Performed at [facility], [address] [place], [place] [address]    Culture (A)  Final    <10,000 COLONIES/mL INSIGNIFICANT GROWTH Performed at [facility], [address], [place], [place] [address]    Report Status 02/11/2020 FINAL  Final     Time coordinating discharge: Over 30 minutes  SIGNED:   Charise Killian, MD  [facility] 02/15/2020, 1:02 PM Pager   If 7PM-7AM, please contact night-coverage [email or web address]

## 2020-02-15 NOTE — Progress Notes (Signed)
BSC and walker in room for home use

## 2020-02-18 NOTE — TOC Progression Note (Signed)
Transition of Care [facility]) - Progression Note    Patient Details  Name: Carla Dickson MRN: [id number] Date of Birth: [birth year]  [facility] CM/SW Contact  Barrie Dunker, RN Phone Number: 02/18/2020, 3:44 PM  Clinical Narrative:     The patient called and asked how was she going to get her medications, I let her know that they were electronically sent to [facility], if she can't afford the medications she could ask [facility] to forward the scripts to medication management as I have provided her with the application on Friday prior to discharge, she hung up without a repsonce  Expected Discharge Plan: Home w Home Health Services Barriers to Discharge: Continued Medical Work up  Expected Discharge Plan and Services Expected Discharge Plan: Home w Home Health Services   Discharge Planning Services: CM Consult   Living arrangements for the past 2 months: Single Family Home Expected Discharge Date: 02/15/20                                     Social Determinants of Health (SDOH) Interventions    Readmission Risk Interventions No flowsheet data found.

## 2020-02-21 ENCOUNTER — Telehealth: Payer: Self-pay | Admitting: General Practice

## 2020-02-21 NOTE — Telephone Encounter (Signed)
We received a referral for pt; reached out to explain the application process but was able to leave a VM and a call back number

## 2020-02-29 ENCOUNTER — Other Ambulatory Visit: Payer: Self-pay

## 2020-02-29 ENCOUNTER — Ambulatory Visit: Payer: Self-pay | Admitting: Pharmacy Technician

## 2020-02-29 DIAGNOSIS — Z79899 Other long term (current) drug therapy: Secondary | ICD-10-CM

## 2020-02-29 NOTE — Progress Notes (Signed)
Completed [facility] application and contract.  Patient agreed to all terms of the [facility] contract.    Patient to provide 2021 bank statement.    Patient approved to receive medication assistance at [facility] until time for re-certification in 9794, and as long as eligibility criteria continues to be met.    Provided patient with community resource material based on her particular needs.    [facility]

## 2020-03-19 ENCOUNTER — Other Ambulatory Visit: Payer: Self-pay | Admitting: Family Medicine

## 2020-04-30 IMAGING — CT CT CERVICAL SPINE W/O CM
5 of 8 series · 13 of 33 positions shown, 14 images · non-contrast
Comparison: Cervical spine CT 10/23/2014

Head CT 10/22/2014

CLINICAL DATA: Fall with impact to the back of the head.

EXAM:
CT HEAD WITHOUT CONTRAST
CT CERVICAL SPINE WITHOUT CONTRAST
TECHNIQUE: Multidetector CT imaging of the head and cervical spine was
performed following the standard protocol without intravenous
contrast. Multiplanar CT image reconstructions of the cervical spine
were also generated.

[Series 3: head bone · axial · 0.40mm/px · z∈[+396,+440]mm · 2 of 68 slices shown]
[im 23/68  bone]
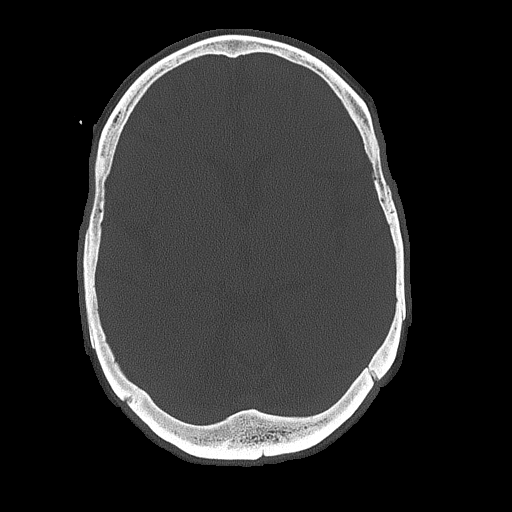
[im 45/68  bone]
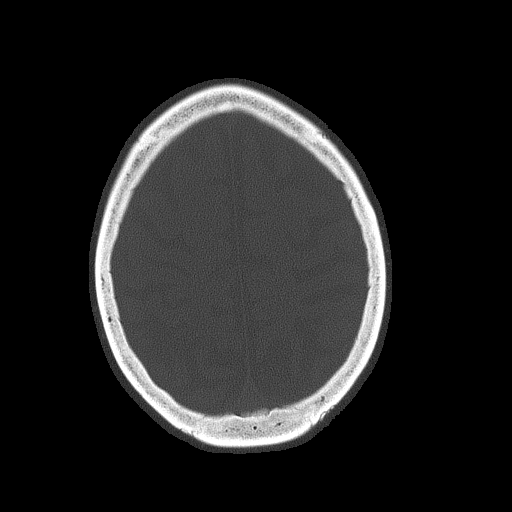

[Series 4: coronal soft tissue · coronal · 0.26mm/px · 2 of 62 slices shown]
[im 21/62  bone]
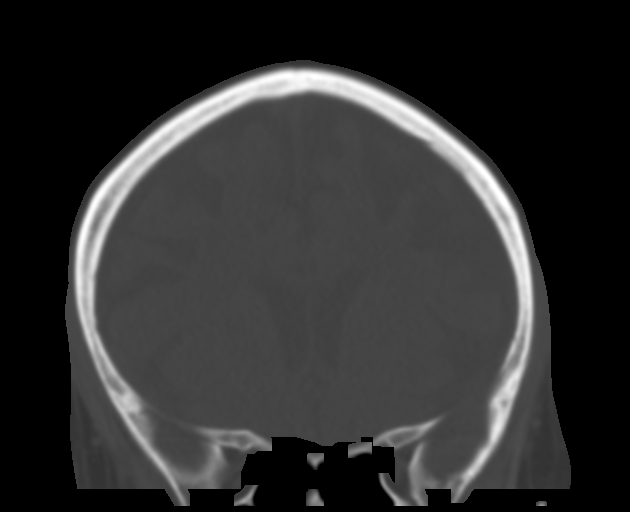
[im 41/62  bone]
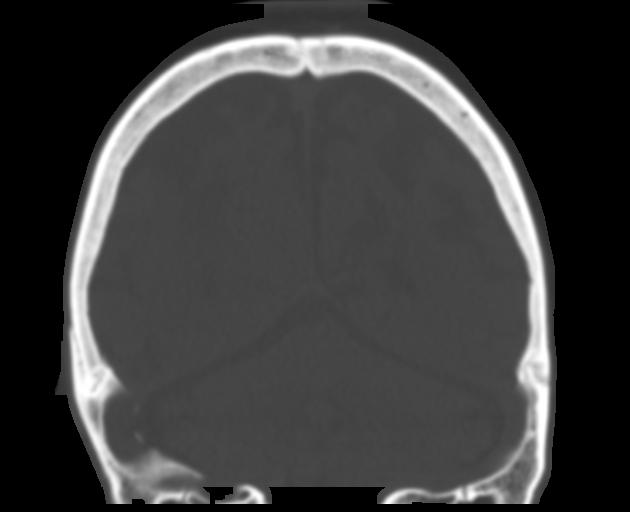

[Series 7: c spine soft · axial · 0.33mm/px · z∈[+285,+335]mm · 2 of 77 slices shown]
[im 26/77  soft-tissue]
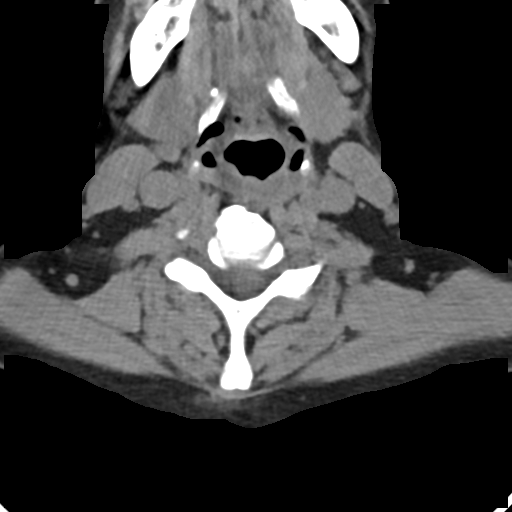
[im 51/77  soft-tissue]
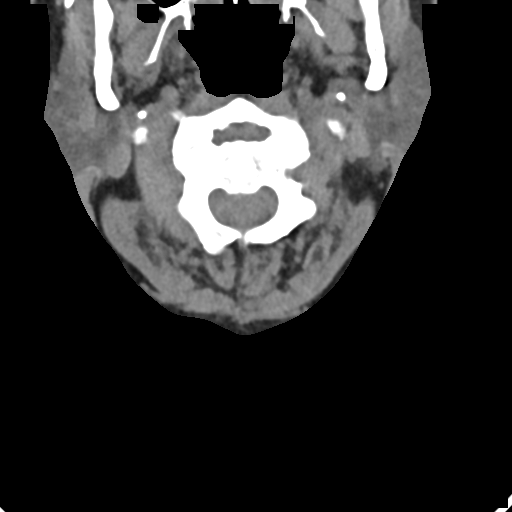

[Series 8: sagittal bone · sagittal · 0.23mm/px · 5 of 48 slices shown]
[im 8/48  bone]
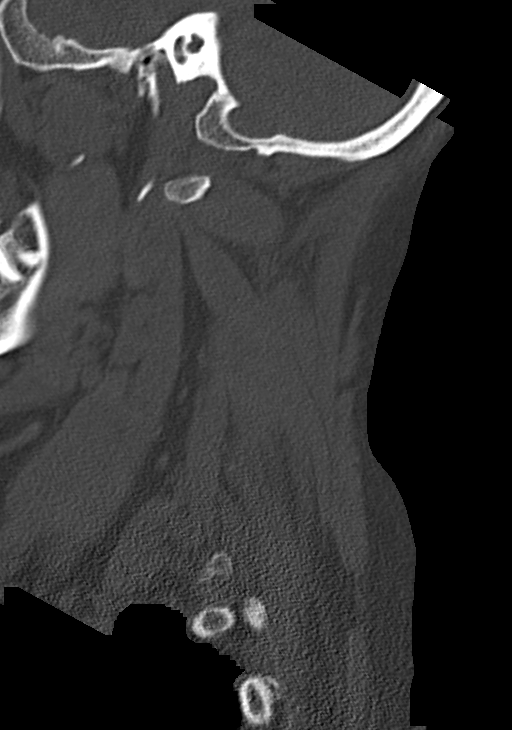
[im 16/48  bone]
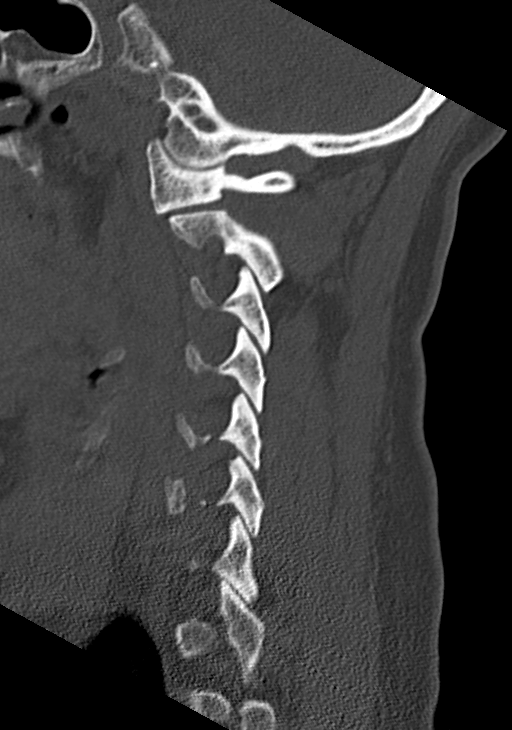
[im 24/48  bone]
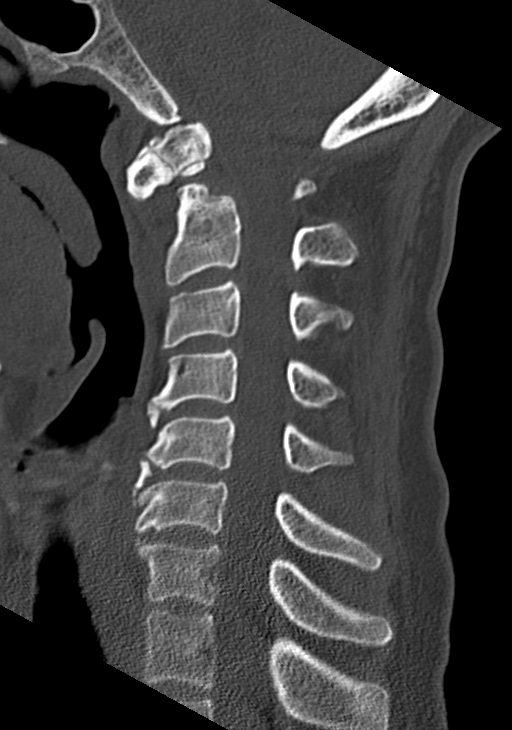
[im 32/48  bone]
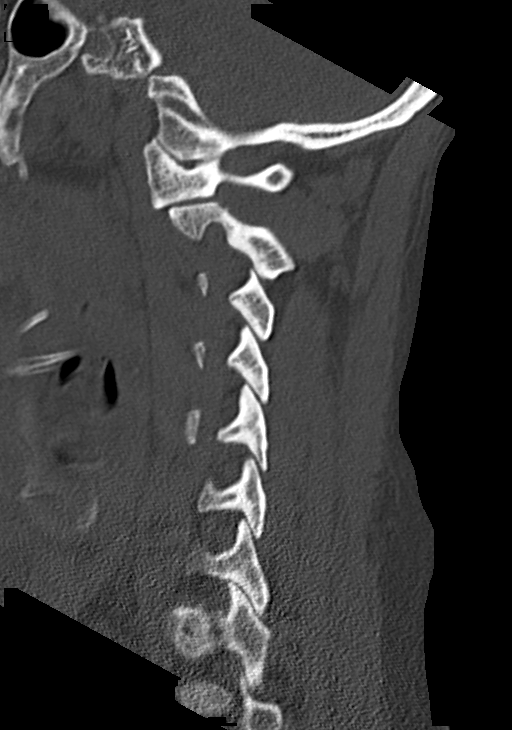
[im 40/48  bone]
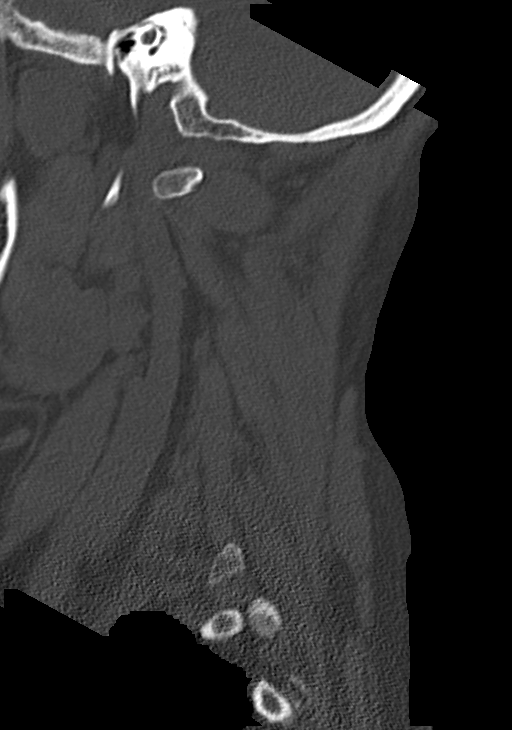

[Series 10: orthogonal bone · axial · 0.23mm/px · z∈[+265,+310]mm · 2 of 76 slices shown, 3 images]
[im 26/76  soft-tissue]
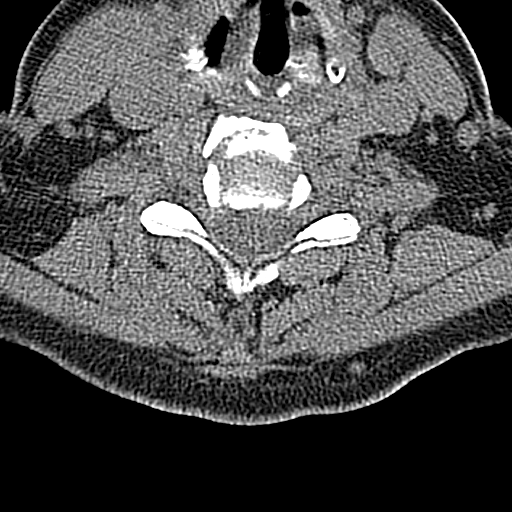
[im 26/76  bone]
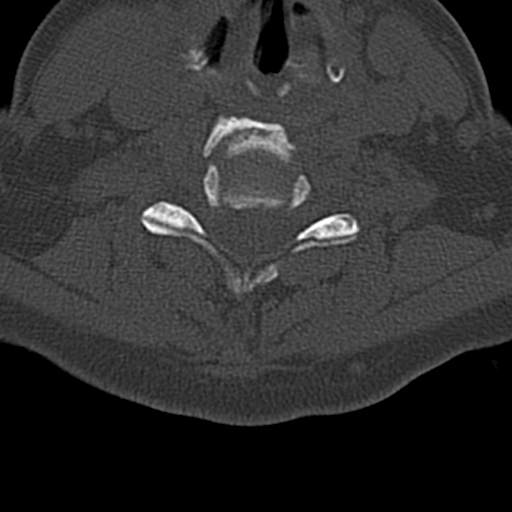
[im 51/76  bone]
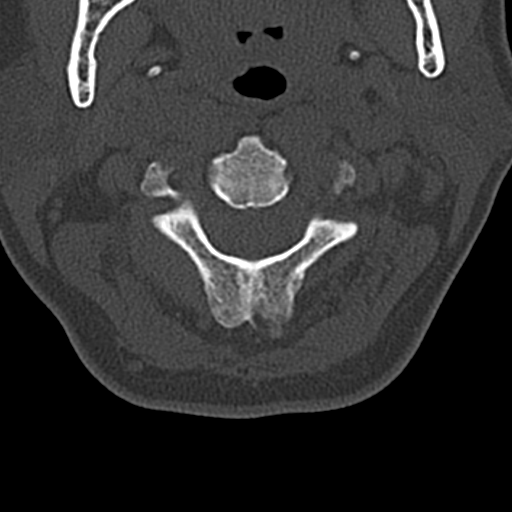

[13 of 33 positions shown; findings below may reference images not displayed]

FINDINGS: CT HEAD FINDINGS

Brain: There is no mass, hemorrhage or extra-axial collection. There
is generalized brain atrophy greater than expected at this age. Old
right parietal lobe infarct is unchanged. There is hypoattenuation
of the periventricular white matter, most commonly indicating
chronic ischemic microangiopathy.

Vascular: No abnormal hyperdensity of the major intracranial
arteries or dural venous sinuses. No intracranial atherosclerosis.

Skull: The visualized skull base, calvarium and extracranial soft
tissues are normal.

Sinuses/Orbits: No fluid levels or advanced mucosal thickening of
the visualized paranasal sinuses. No mastoid or middle ear effusion.
The orbits are normal.

CT CERVICAL SPINE FINDINGS

Alignment: Os odontoideum is again noted, with unchanged alignment.
No static subluxation. Facets are aligned.

Skull base and vertebrae: No acute fracture.

Soft tissues and spinal canal: No prevertebral fluid or swelling. No
visible canal hematoma.

Disc levels: No advanced spinal canal or neural foraminal stenosis.
Multilevel anterior osteophytosis.

Upper chest: No pneumothorax, pulmonary nodule or pleural effusion.

Other: Normal visualized paraspinal cervical soft tissues.
IMPRESSION: 1. No acute intracranial abnormality.
2. No fracture or static subluxation of the cervical spine.
3. Unchanged appearance of os odontoideum.
4. Age advanced brain atrophy, old right parietal infarct and
findings of chronic small vessel disease.

## 2020-05-26 ENCOUNTER — Other Ambulatory Visit: Payer: Self-pay | Admitting: Family Medicine

## 2020-06-04 ENCOUNTER — Emergency Department: Payer: Self-pay

## 2020-06-04 ENCOUNTER — Other Ambulatory Visit: Payer: Self-pay

## 2020-06-04 ENCOUNTER — Emergency Department
Admission: EM | Admit: 2020-06-04 | Discharge: 2020-06-05 | Disposition: A | Discharge status: Home / Self Care | Payer: Self-pay | Attending: Emergency Medicine | Admitting: Emergency Medicine

## 2020-06-04 DIAGNOSIS — F17210 Nicotine dependence, cigarettes, uncomplicated: Secondary | ICD-10-CM | POA: Insufficient documentation

## 2020-06-04 DIAGNOSIS — I10 Essential (primary) hypertension: Secondary | ICD-10-CM | POA: Insufficient documentation

## 2020-06-04 DIAGNOSIS — F10239 Alcohol dependence with withdrawal, unspecified: Secondary | ICD-10-CM | POA: Insufficient documentation

## 2020-06-04 DIAGNOSIS — Z20822 Contact with and (suspected) exposure to covid-19: Secondary | ICD-10-CM | POA: Insufficient documentation

## 2020-06-04 DIAGNOSIS — Z79899 Other long term (current) drug therapy: Secondary | ICD-10-CM | POA: Insufficient documentation

## 2020-06-04 DIAGNOSIS — R569 Unspecified convulsions: Secondary | ICD-10-CM | POA: Insufficient documentation

## 2020-06-04 DIAGNOSIS — F10939 Alcohol use, unspecified with withdrawal, unspecified: Secondary | ICD-10-CM

## 2020-06-04 LAB — POCT PREGNANCY, URINE: Preg Test, Ur: NEGATIVE

## 2020-06-04 LAB — CBC WITH DIFFERENTIAL/PLATELET
Abs Immature Granulocytes: 0.05 10*3/uL (ref 0.00–0.07)
Basophils Absolute: 0.1 10*3/uL (ref 0.0–0.1)
Basophils Relative: 0 %
Eosinophils Absolute: 0 10*3/uL (ref 0.0–0.5)
Eosinophils Relative: 0 %
HCT: 32.7 % — ABNORMAL LOW (ref 36.0–46.0)
Hemoglobin: 10.8 g/dL — ABNORMAL LOW (ref 12.0–15.0)
Immature Granulocytes: 0 %
Lymphocytes Relative: 14 %
Lymphs Abs: 2.1 10*3/uL (ref 0.7–4.0)
MCH: 28.9 pg (ref 26.0–34.0)
MCHC: 33 g/dL (ref 30.0–36.0)
MCV: 87.4 fL (ref 80.0–100.0)
Monocytes Absolute: 0.7 10*3/uL (ref 0.1–1.0)
Monocytes Relative: 5 %
Neutro Abs: 11.4 10*3/uL — ABNORMAL HIGH (ref 1.7–7.7)
Neutrophils Relative %: 81 %
Platelets: 256 10*3/uL (ref 150–400)
RBC: 3.74 MIL/uL — ABNORMAL LOW (ref 3.87–5.11)
RDW: 14.4 % (ref 11.5–15.5)
WBC: 14.3 10*3/uL — ABNORMAL HIGH (ref 4.0–10.5)
nRBC: 0 % (ref 0.0–0.2)

## 2020-06-04 LAB — COMPREHENSIVE METABOLIC PANEL
ALT: 17 U/L (ref 0–44)
AST: 24 U/L (ref 15–41)
Albumin: 3.8 g/dL (ref 3.5–5.0)
Alkaline Phosphatase: 99 U/L (ref 38–126)
Anion gap: 12 (ref 5–15)
BUN: 23 mg/dL — ABNORMAL HIGH (ref 6–20)
CO2: 23 mmol/L (ref 22–32)
Calcium: 9.1 mg/dL (ref 8.9–10.3)
Chloride: 105 mmol/L (ref 98–111)
Creatinine, Ser: 0.65 mg/dL (ref 0.44–1.00)
GFR calc Af Amer: >60 mL/min (ref >60)
GFR calc non Af Amer: >60 mL/min (ref >60)
Glucose, Bld: 141 mg/dL — ABNORMAL HIGH (ref 70–99)
Potassium: 3.6 mmol/L (ref 3.5–5.1)
Sodium: 140 mmol/L (ref 135–145)
Total Bilirubin: 0.8 mg/dL (ref 0.3–1.2)
Total Protein: 8.2 g/dL — ABNORMAL HIGH (ref 6.5–8.1)

## 2020-06-04 LAB — URINALYSIS, COMPLETE (UACMP) WITH MICROSCOPIC
Bilirubin Urine: NEGATIVE
Glucose, UA: 50 mg/dL — AB
Hgb urine dipstick: NEGATIVE
Ketones, ur: NEGATIVE mg/dL
Nitrite: NEGATIVE
Protein, ur: 100 mg/dL — AB
Specific Gravity, Urine: 1.02 (ref 1.005–1.030)
pH: 7 (ref 5.0–8.0)

## 2020-06-04 LAB — URINE DRUG SCREEN, QUALITATIVE (ARMC ONLY)
Amphetamines, Ur Screen: NOT DETECTED
Barbiturates, Ur Screen: NOT DETECTED
Benzodiazepine, Ur Scrn: NOT DETECTED
Cannabinoid 50 Ng, Ur ~~LOC~~: NOT DETECTED
Cocaine Metabolite,Ur ~~LOC~~: POSITIVE — AB
MDMA (Ecstasy)Ur Screen: NOT DETECTED
Methadone Scn, Ur: NOT DETECTED
Opiate, Ur Screen: NOT DETECTED
Phencyclidine (PCP) Ur S: NOT DETECTED
Tricyclic, Ur Screen: NOT DETECTED

## 2020-06-04 LAB — SARS CORONAVIRUS 2 BY RT PCR (HOSPITAL ORDER, PERFORMED IN ~~LOC~~ HOSPITAL LAB): SARS Coronavirus 2: NEGATIVE

## 2020-06-04 LAB — PREGNANCY, URINE: Preg Test, Ur: NEGATIVE

## 2020-06-04 LAB — SALICYLATE LEVEL: Salicylate Lvl: <7 mg/dL — ABNORMAL LOW (ref 7.0–30.0)

## 2020-06-04 LAB — ACETAMINOPHEN LEVEL: Acetaminophen (Tylenol), Serum: <10 ug/mL — ABNORMAL LOW (ref 10–30)

## 2020-06-04 LAB — ETHANOL: Alcohol, Ethyl (B): <10 mg/dL (ref <10)

## 2020-06-04 MED ORDER — ONDANSETRON HCL 4 MG/2ML IJ SOLN
INTRAMUSCULAR | Status: AC
  Administered 2020-06-04: 4 mg via INTRAVENOUS
  Filled 2020-06-04: qty 2

## 2020-06-04 MED ORDER — LORAZEPAM 2 MG/ML IJ SOLN
INTRAMUSCULAR | Status: AC
  Administered 2020-06-04: 1 mg via INTRAVENOUS
  Filled 2020-06-04: qty 1

## 2020-06-04 MED ORDER — LORAZEPAM 2 MG/ML IJ SOLN: 1.0000 mg | Freq: Once | INTRAMUSCULAR | Status: AC

## 2020-06-04 MED ORDER — ONDANSETRON HCL 4 MG/2ML IJ SOLN: 4.0000 mg | Freq: Once | INTRAMUSCULAR | Status: AC

## 2020-06-04 MED ORDER — LEVETIRACETAM IN NACL 1000 MG/100ML IV SOLN
1000.0000 mg | Freq: Once | INTRAVENOUS | Status: AC
  Administered 2020-06-04: 1000 mg via INTRAVENOUS
  Filled 2020-06-04: qty 100

## 2020-06-04 MED ORDER — SODIUM CHLORIDE 0.9 % IV SOLN
1.0000 g | INTRAVENOUS | Status: DC
  Administered 2020-06-05: 1 g via INTRAVENOUS
  Filled 2020-06-04: qty 10

## 2020-06-04 NOTE — ED Provider Notes (Signed)
[facility] Emergency Department Provider Note  ____________________________________________   First MD Initiated Contact with Patient 06/04/20 1944     (approximate)  I have reviewed the triage vital signs and the nursing notes.   HISTORY  Chief Complaint Seizures    HPI Carla Dickson is a 49 y.o. female with a history of alcohol abuse.  She reports she has been drinking a lot.  Her husband was in [place] but she he came back home I believe yesterday.  She was shaking some today and then was noted to have a tonic-clonic grand mal type seizure.  Husband reports she saw her held her to keep her from hurting herself turned on her side and ran to get help.  Ambulance brought her here.  Patient arrives here postictal.  Now she is awake alert moving all extremities equally and well.         Past Medical History:  Diagnosis Date  . Alcohol use   . Fracture of fifth metacarpal bone of left hand with routine healing   . Hypercholesteremia   . Hypertension   . Reflux   . Seizures (HCC)   . 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 Malnutrition of moderate degree 02/08/2020  . Encounter for hospice care discussion   . Seizure disorder (HCC)   . Goals of care, counseling/discussion   . Palliative care by specialist   . DNR (do not resuscitate) discussion   . Acute encephalopathy 02/03/2020  . Alcohol withdrawal syndrome, with delirium (HCC)   . Encephalopathy 10/04/2019  . GERD (gastroesophageal reflux disease) 01/28/2019  . HTN (hypertension) 01/28/2019  . HLD (hyperlipidemia) 01/28/2019  . Alcohol use 01/28/2019  . Fall 01/28/2019  . SIRS (systemic inflammatory response syndrome) (HCC) 01/28/2019    Past Surgical History:  Procedure Laterality Date  . ABDOMINAL HYSTERECTOMY      Prior to Admission medications   Medication Sig Start  Date End Date Taking? Authorizing Provider  atorvastatin (LIPITOR) 40 MG tablet Take 1 tablet (40 mg total) by mouth daily. 02/15/20 03/16/20  Charise Killian, MD  divalproex (DEPAKOTE SPRINKLE) 125 MG capsule Take 2 capsules (250 mg total) by mouth every 8 (eight) hours. 02/15/20 03/16/20  Charise Killian, MD  levETIRAcetam (KEPPRA) 500 MG tablet Take 1 tablet (500 mg total) by mouth 2 (two) times daily. 02/15/20 03/16/20  Charise Killian, MD  OLANZapine (ZYPREXA) 2.5 MG tablet Take 1 tablet (2.5 mg total) by mouth at bedtime. 02/15/20   Roselind Messier, MD  pantoprazole (PROTONIX) 40 MG tablet Take 1 tablet (40 mg total) by mouth daily. 02/15/20 03/16/20  Charise Killian, MD  sertraline (ZOLOFT) 25 MG tablet Take 1 tablet (25 mg total) by mouth daily. 02/15/20   Roselind Messier, MD    Allergies Patient has no known allergies.  Family History  Problem Relation Age of Onset  . Breast cancer Maternal Grandmother     Social History Social History   Tobacco Use  . Smoking status: Current Every Day Smoker    Packs/day: 0.50    Types: Cigarettes  . Smokeless tobacco: Never Used  Substance Use Topics  . Alcohol use: Yes    Alcohol/week: 26.0 standard drinks    Types: 2 Cans of beer, 24 Shots of liquor per week    Comment: 2-3 drinks liquor per day  . Drug use: No  Review of Systems  Constitutional: No fever/chills Eyes: No visual changes. ENT: No sore throat. Cardiovascular: Denies chest pain. Respiratory: Denies shortness of breath. Gastrointestinal: No abdominal pain.  No nausea, no vomiting.  No diarrhea.  No constipation. Genitourinary: Negative for dysuria. Musculoskeletal: Negative for back pain. Skin: Negative for rash. Neurological: Negative for headaches, focal weakness   ____________________________________________   PHYSICAL EXAM:  VITAL SIGNS: ED Triage Vitals  Enc Vitals Group     BP 06/04/20 1854 (!) 161/91     Pulse Rate 06/04/20 1854 (!) 115      Resp 06/04/20 1854 19     Temp 06/04/20 1854 98.7 F (37.1 C)     Temp Source 06/04/20 1854 Oral     SpO2 06/04/20 1854 98 %     Weight 06/04/20 1848 169 lb 12.1 oz (77 kg)     Height 06/04/20 1848 5\' 4"  (1.626 m)     Head Circumference --      Peak Flow --      Pain Score 06/04/20 1854 0     Pain Loc --      Pain Edu? --      Excl. in GC? --     Constitutional: Alert and oriented. Well appearing and in no acute distress. Eyes: Conjunctivae are normal. PERRL. EOMI. Head: Atraumatic except for recent resolving left black eye Nose: No congestion/rhinnorhea. Mouth/Throat: Mucous membranes are moist.  Oropharynx non-erythematous. Neck: No stridor.   Cardiovascular: Normal rate, regular rhythm. Grossly normal heart sounds.  Good peripheral circulation. Respiratory: Normal respiratory effort.  No retractions. Lungs CTAB. Gastrointestinal: Soft and nontender. No distention. No abdominal bruits. No CVA tenderness. Musculoskeletal: No lower extremity tenderness nor edema.   Neurologic:  Normal speech and language. No gross focal neurologic deficits are appreciated patient is somewhat tremulous though. Skin:  Skin is warm, dry and intact. No rash noted.   ____________________________________________   LABS (all labs ordered are listed, but only abnormal results are displayed)  Labs Reviewed  COMPREHENSIVE METABOLIC PANEL - Abnormal; Notable for the following components:      Result Value   Glucose, Bld 141 (*)    BUN 23 (*)    Total Protein 8.2 (*)    All other components within normal limits  ACETAMINOPHEN LEVEL - Abnormal; Notable for the following components:   Acetaminophen (Tylenol), Serum <10 (*)    All other components within normal limits  CBC WITH DIFFERENTIAL/PLATELET - Abnormal; Notable for the following components:   WBC 14.3 (*)    RBC 3.74 (*)    Hemoglobin 10.8 (*)    HCT 32.7 (*)    Neutro Abs 11.4 (*)    All other components within normal limits   URINALYSIS, COMPLETE (UACMP) WITH MICROSCOPIC - Abnormal; Notable for the following components:   Color, Urine YELLOW (*)    APPearance HAZY (*)    Glucose, UA 50 (*)    Protein, ur 100 (*)    Leukocytes,Ua MODERATE (*)    Bacteria, UA RARE (*)    All other components within normal limits  URINE DRUG SCREEN, QUALITATIVE ([facility] ONLY) - Abnormal; Notable for the following components:   Cocaine Metabolite,Ur [place] POSITIVE (*)    All other components within normal limits  SALICYLATE LEVEL - Abnormal; Notable for the following components:   Salicylate Lvl <7.0 (*)    All other components within normal limits  SARS CORONAVIRUS 2 BY RT PCR (HOSPITAL ORDER, PERFORMED IN [facility])  URINE CULTURE  ETHANOL  PREGNANCY, URINE  VALPROIC ACID LEVEL  POCT PREGNANCY, URINE  POC URINE PREG, ED   ____________________________________________  EKG EKG read interpreted by me shows sinus tachycardia rate of 114 left axis no acute ST-T wave changes  ____________________________________________  RADIOLOGY  ED MD interpretation:   Official radiology report(s): CT Head Wo Contrast  Result Date: 06/04/2020 CLINICAL DATA:  Seizure, alcohol abuse, left orbital ecchymosis EXAM: CT HEAD WITHOUT CONTRAST TECHNIQUE: Contiguous axial images were obtained from the base of the skull through the vertex without intravenous contrast. COMPARISON:  02/03/2020 FINDINGS: Brain: Stable hypodensities throughout the periventricular white matter, most pronounced within the left frontal and right parietal regions. No signs of acute infarct or hemorrhage. Lateral ventricles and midline structures are unremarkable. No acute extra-axial fluid collections. No mass effect. Vascular: No hyperdense vessel or unexpected calcification. Skull: Normal. Negative for fracture or focal lesion. Sinuses/Orbits: No acute finding. Other: None. IMPRESSION: 1. Stable chronic small vessel ischemic changes within the white matter. No  acute intracranial process. Electronically Signed   By: Sharlet Salina M.D.   On: 06/04/2020 21:06    ____________________________________________   PROCEDURES  Procedure(s) performed (including Critical Care):  Procedures   ____________________________________________   INITIAL IMPRESSION / ASSESSMENT AND PLAN / ED COURSE  Patient has a seizure disorder. She is post to take Keppra and Depakote. She has been drinking a lot and does not think she has been taking her medicines. She is still very tremulous. She may be withdrawing from alcohol. She does seem to be much more with it than she had been earlier. Since she has the black eye I will go ahead and do a CT and make sure she does not have any subdural or any other kind of intracranial injury. Anticipate watching her at least overnight as her husband is planning to take her home and not allow any alcohol. She may go into DTs if this occurs. She will not tell me how much she has been drinking she just says a lot.              ____________________________________________   FINAL CLINICAL IMPRESSION(S) / ED DIAGNOSES  Final diagnoses:  Seizure (HCC)  Alcohol withdrawal syndrome with complication [facility])     ED Discharge Orders    None       Note:  This document was prepared using Dragon voice recognition software and may include unintentional dictation errors.    Arnaldo Natal, MD 06/04/20 2140

## 2020-06-04 NOTE — ED Notes (Signed)
Pt with 4 episodes of emesis. Clear, thin, pinkish tinged  Pt S/O reports significant drinking by patient, reports that patient is currently taking seizure medications

## 2020-06-04 NOTE — ED Triage Notes (Signed)
Pt arrives from home via [facility] with complaint of witnessed seizure lasting approx 1 min. Family reports no fall/injury, happened on couch.   Pt with history of seizures and alcohol abuse. Pt partner reports positive pregnancy tests, but EHR shows hysterectomy. Pt unable to give clear answers at this time per postictal status   167/93 120 hr 195 cbg 97.9 t 99% ra

## 2020-06-04 NOTE — ED Notes (Signed)
Pt reports taking medication for her blood pressure, denies currently being on seizure medications  Pt confirms hysterectomy. Pt oriented to self, place, somewhat oriented to situation

## 2020-06-05 LAB — VALPROIC ACID LEVEL: Valproic Acid Lvl: <10 ug/mL — ABNORMAL LOW (ref 50.0–100.0)

## 2020-06-05 MED ORDER — CHLORDIAZEPOXIDE HCL 25 MG PO CAPS
25.0000 mg | ORAL_CAPSULE | Freq: Once | ORAL | Status: AC
  Administered 2020-06-05: 25 mg via ORAL
  Filled 2020-06-05: qty 1

## 2020-06-05 NOTE — ED Provider Notes (Signed)
I assumed care of the patient from Dr. Juliette Alcide at 11:00 PM with recommendation to observe the patient overnight for any further seizure-like activity.  Patient had no further seizure-like activity overnight.  As per Dr. Farrel Gobble plan patient will be discharged home in the custody of her husband.   Darci Current, MD 06/05/20 [phone]

## 2020-06-05 NOTE — ED Notes (Signed)
No signature pad available for pt to sign for discharge

## 2020-06-05 NOTE — ED Notes (Signed)
Called pt's fiance and he states he will be here to pick up pt around 8 AM

## 2020-06-05 NOTE — ED Notes (Signed)
Pt up to bathroom with this RN. Pt ambulatory with steady/slow gait.

## 2020-06-06 LAB — URINE CULTURE

## 2020-06-23 ENCOUNTER — Ambulatory Visit: Payer: Self-pay

## 2020-09-22 ENCOUNTER — Other Ambulatory Visit: Payer: Self-pay | Admitting: Family Medicine

## 2020-10-27 ENCOUNTER — Other Ambulatory Visit: Payer: Self-pay | Admitting: Physician Assistant

## 2020-10-27 DIAGNOSIS — Z1231 Encounter for screening mammogram for malignant neoplasm of breast: Secondary | ICD-10-CM

## 2020-10-30 NOTE — Progress Notes (Signed)
Medication Management Clinic Visit Note  Patient: Carla Dickson MRN: [id number] Date of Birth: 05/22/1971 PCP: Patient, No Pcp Per   Carla Dickson 50 y.o. female presents for a telephone visit for medication management today. Verified patient with two identifiers.   There were no vitals taken for this visit.  Patient Information   Past Medical History:  Diagnosis Date  . Alcohol use   . Fracture of fifth metacarpal bone of left hand with routine healing   . Hypercholesteremia   . Hypertension   . Reflux   . Seizures ([place])   . 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 ABDOMINAL HYSTERECTOMY       Family History  Problem Relation Age of Onset  . Breast cancer Maternal Grandmother     New Diagnoses (since last visit): N/A  Family Support: Good  Lifestyle Diet: Breakfast: bacon, eggs, toast Lunch: fish sticks Dinner: chicken  Drinks: water and soda    Current Exercise Habits: The patient does not participate in regular exercise at present  Exercise limited by: Other - see comments (leg cramps)    Social History   Substance and Sexual Activity  Alcohol Use Yes  . Alcohol/week: 26.0 standard drinks  . Types: 2 Cans of beer, 24 Shots of liquor per week   Comment: 2-3 drinks liquor per day      Social History   Tobacco Use  Smoking Status Current Every Day Smoker  . Packs/day: 0.50  . Types: Cigarettes  Smokeless Tobacco Never Used      Health Maintenance  Topic Date Due  . Hepatitis C Screening  Never done  . COVID-19 Vaccine (1) Never done  . TETANUS/TDAP  Never done  . PAP SMEAR-Modifier  Never done  . COLONOSCOPY (Pts 45-14yrs Insurance coverage will need to be confirmed)  Never done  . INFLUENZA VACCINE  Never done  . HIV Screening  Completed   Health Maintenance/Date Completed  Last ED visit: 06/04/20 Last Visit to PCP:  10/23/20 Next Visit to PCP: f/u in 3 months Specialist Visit: 12/25/20 - neurology Dental Exam: has appt 12/04/20 Eye Exam: unknown Pelvic/PAP Exam: hysterectomy Mammogram: got referral from PCP Colonoscopy: got home test kit to see if she needs a colonoscopy Flu Vaccine: completed Pneumonia Vaccine: never done  COVID-19 Vaccine: completed primary series and booster  Outpatient Encounter Medications as of 10/31/2020  Medication Sig  . aspirin EC 81 MG tablet Take 81 mg by mouth daily. Swallow whole.  Marland Kitchen atorvastatin (LIPITOR) 40 MG tablet Take 1 tablet (40 mg total) by mouth daily.  . divalproex (DEPAKOTE SPRINKLE) 125 MG capsule Take 2 capsules (250 mg total) by mouth every 8 (eight) hours.  . fluticasone (FLONASE) 50 MCG/ACT nasal spray Place 2 sprays into both nostrils daily.  . hydrochlorothiazide (HYDRODIURIL) 25 MG tablet Take 25 mg by mouth daily.  Marland Kitchen levETIRAcetam (KEPPRA) 500 MG tablet Take 1 tablet (500 mg total) by mouth 2 (two) times daily. (Patient taking differently: Take 250 mg by mouth 2 (two) times daily.)  . OLANZapine (ZYPREXA) 2.5 MG tablet Take 1 tablet (2.5 mg total) by mouth at bedtime.  . pantoprazole (PROTONIX) 40 MG tablet Take 1 tablet (40 mg total) by mouth daily.  . sertraline (ZOLOFT) 25 MG tablet Take 1 tablet (25 mg total) by mouth daily.   No facility-administered encounter medications on file as of 10/31/2020.  Assessment and Plan: Seizures Pt currently takes levetiracetam and divalproex. Pt denies recent seizures and states she has an appointment with neurology in March to identify what is causing her seizures. Continue current regimen.   HTN Pt takes HCTZ; unable to obtain vitals due to telephone visit however pt states her BP was "real good" at her last appt. The pt does not currently have a cuff but recently ordered one. Advised pt to keep a log of her BP values once she receives the cuff and to bring the log to her appts. Pt denies any symptoms or  episodes of hypotension. Continue current regimen.   HLD Pt takes atorvastatin. No recent labs available. Encouraged pt to have low fat/low cholesterol diet. Continue current regimen.   Depression Pt takes olanzapine and sertraline. Pt does not report any changes in mood or behavior. Continue current regimen and reach out to PCP if symptoms worsen.   GERD Pt takes pantoprazole and does not have any complaints at this time. Continue current regimen.   Allergies Pt uses fluticasone nasal spray for allergies. Pt reports this medication is working well for her symptoms and does not have any complaints at this time. Continue current regimen.   Tobacco abuse Pt used to smoke 0.5ppd and is now down to 0.1ppd. Pt continues to work on quitting and has cut down. Congratulated pt on her success this far and encouraged her to continue working on smoking cessation.   Access/Adherence Pt endorses adherence as it is apart of her normal daily routine. She also has a friend who calls her to make sure sure she takes her meds. Pt did not need refills at this time.    Sherilyn Banker, PharmD Pharmacy Resident  10/31/2020 10:18 AM

## 2020-10-31 ENCOUNTER — Other Ambulatory Visit: Payer: Self-pay

## 2020-10-31 ENCOUNTER — Ambulatory Visit: Payer: Medicaid Other | Admitting: Pharmacist

## 2020-10-31 DIAGNOSIS — Z79899 Other long term (current) drug therapy: Secondary | ICD-10-CM

## 2020-11-10 ENCOUNTER — Other Ambulatory Visit: Payer: Self-pay | Admitting: Physician Assistant

## 2020-12-11 ENCOUNTER — Other Ambulatory Visit: Payer: Self-pay | Admitting: Family Medicine

## 2020-12-29 ENCOUNTER — Other Ambulatory Visit: Payer: Self-pay

## 2020-12-29 MED FILL — Atorvastatin Calcium Tab 40 MG (Base Equivalent): ORAL | 90 days supply | Qty: 90 | Fill #0 | Status: AC

## 2020-12-29 MED FILL — Pantoprazole Sodium EC Tab 40 MG (Base Equiv): ORAL | 90 days supply | Qty: 90 | Fill #0 | Status: AC

## 2020-12-29 MED FILL — Hydrochlorothiazide Tab 25 MG: ORAL | 90 days supply | Qty: 90 | Fill #0 | Status: AC

## 2020-12-30 ENCOUNTER — Other Ambulatory Visit (HOSPITAL_COMMUNITY): Payer: Self-pay

## 2020-12-30 ENCOUNTER — Other Ambulatory Visit: Payer: Self-pay

## 2021-01-05 ENCOUNTER — Other Ambulatory Visit: Payer: Self-pay

## 2021-01-27 ENCOUNTER — Ambulatory Visit: Payer: Self-pay

## 2021-01-27 ENCOUNTER — Other Ambulatory Visit: Payer: Self-pay

## 2021-01-27 MED ORDER — LEVETIRACETAM 1000 MG PO TABS
ORAL_TABLET | ORAL | 9 refills | Status: AC
  Filled 2021-01-27: qty 30, 30d supply, fill #0

## 2021-01-27 MED FILL — Olanzapine Tab 2.5 MG: ORAL | 30 days supply | Qty: 30 | Fill #0 | Status: AC

## 2021-01-27 MED FILL — Levetiracetam Tab 250 MG: ORAL | 90 days supply | Qty: 180 | Fill #0 | Status: CN

## 2021-01-27 MED FILL — Aspirin Tab Delayed Release 81 MG: ORAL | 90 days supply | Qty: 90 | Fill #0 | Status: AC

## 2021-01-27 MED FILL — Ferrous Sulfate Tab 325 MG (65 MG Elemental Fe): ORAL | 90 days supply | Qty: 90 | Fill #0 | Status: AC

## 2021-01-27 MED FILL — Fluticasone Propionate Nasal Susp 50 MCG/ACT: NASAL | 30 days supply | Qty: 16 | Fill #0 | Status: AC

## 2021-01-27 MED FILL — Sertraline HCl Tab 25 MG: ORAL | 30 days supply | Qty: 30 | Fill #0 | Status: AC

## 2021-01-27 MED FILL — Divalproex Sodium Tab Delayed Release 250 MG: ORAL | 30 days supply | Qty: 90 | Fill #0 | Status: AC

## 2021-01-28 ENCOUNTER — Other Ambulatory Visit: Payer: Self-pay

## 2021-02-05 ENCOUNTER — Other Ambulatory Visit: Payer: Self-pay

## 2021-02-06 ENCOUNTER — Other Ambulatory Visit: Payer: Self-pay

## 2021-02-06 MED ORDER — LEVETIRACETAM 1000 MG PO TABS
1000.0000 mg | ORAL_TABLET | Freq: Two times a day (BID) | ORAL | 3 refills | Status: AC
  Filled 2021-02-06: qty 90, 45d supply, fill #0

## 2021-04-16 ENCOUNTER — Other Ambulatory Visit: Payer: Self-pay

## 2021-08-28 ENCOUNTER — Other Ambulatory Visit: Payer: Self-pay | Admitting: Family Medicine

## 2021-08-28 DIAGNOSIS — Z1231 Encounter for screening mammogram for malignant neoplasm of breast: Secondary | ICD-10-CM

## 2021-09-10 ENCOUNTER — Other Ambulatory Visit: Payer: Self-pay

## 2021-12-08 ENCOUNTER — Other Ambulatory Visit: Payer: Self-pay

## 2022-12-13 ENCOUNTER — Other Ambulatory Visit: Payer: Self-pay | Admitting: Nurse Practitioner

## 2022-12-13 DIAGNOSIS — Z1231 Encounter for screening mammogram for malignant neoplasm of breast: Secondary | ICD-10-CM

## 2022-12-20 ENCOUNTER — Telehealth: Payer: Self-pay | Admitting: *Deleted

## 2022-12-20 NOTE — Telephone Encounter (Signed)
Pt called in and left VM with call back number of [phone]. Attempted to call this numbr but unable to leave a VM. Also tried to call [phone] but was unable to leave a message. Will try again to contact regarding lung cancer screening.

## 2022-12-20 NOTE — Telephone Encounter (Signed)
Called back and spoke with pt. Pt reports smoking for 37 years at 1/4 ppd which equals a 9 pk yr smoking history. Pt will not qualify. PT verbalized understanding.

## 2023-01-18 ENCOUNTER — Other Ambulatory Visit: Payer: Self-pay

## 2023-01-19 ENCOUNTER — Other Ambulatory Visit (HOSPITAL_COMMUNITY): Payer: Self-pay

## 2023-11-09 ENCOUNTER — Other Ambulatory Visit: Payer: Self-pay | Admitting: Nurse Practitioner

## 2023-11-09 DIAGNOSIS — Z1231 Encounter for screening mammogram for malignant neoplasm of breast: Secondary | ICD-10-CM

## 2024-01-04 ENCOUNTER — Encounter

## 2024-01-09 ENCOUNTER — Ambulatory Visit
Admission: RE | Admit: 2024-01-09 | Discharge: 2024-01-09 | Disposition: A | Discharge status: Home / Self Care | Source: Ambulatory Visit | Attending: Nurse Practitioner | Admitting: Nurse Practitioner

## 2024-01-09 DIAGNOSIS — Z1231 Encounter for screening mammogram for malignant neoplasm of breast: Secondary | ICD-10-CM | POA: Insufficient documentation

## 2024-01-31 ENCOUNTER — Other Ambulatory Visit: Payer: Self-pay | Admitting: Nurse Practitioner

## 2024-01-31 DIAGNOSIS — R928 Other abnormal and inconclusive findings on diagnostic imaging of breast: Secondary | ICD-10-CM

## 2024-02-01 ENCOUNTER — Encounter

## 2024-02-01 ENCOUNTER — Other Ambulatory Visit

## 2024-02-08 ENCOUNTER — Ambulatory Visit
Admission: RE | Admit: 2024-02-08 | Discharge: 2024-02-08 | Disposition: A | Discharge status: Home / Self Care | Source: Ambulatory Visit | Attending: Nurse Practitioner | Admitting: Nurse Practitioner

## 2024-02-08 DIAGNOSIS — R928 Other abnormal and inconclusive findings on diagnostic imaging of breast: Secondary | ICD-10-CM | POA: Diagnosis present

## 2024-04-03 ENCOUNTER — Ambulatory Visit: Admitting: Podiatry

## 2024-04-12 ENCOUNTER — Ambulatory Visit: Admitting: Podiatry

## 2024-04-19 ENCOUNTER — Ambulatory Visit: Admitting: Podiatry

## 2024-07-26 ENCOUNTER — Other Ambulatory Visit: Payer: Self-pay | Admitting: Nurse Practitioner

## 2024-07-26 DIAGNOSIS — N63 Unspecified lump in unspecified breast: Secondary | ICD-10-CM

## 2024-08-13 ENCOUNTER — Inpatient Hospital Stay: Admission: RE | Admit: 2024-08-13 | Source: Ambulatory Visit

## 2024-08-21 ENCOUNTER — Inpatient Hospital Stay: Admission: RE | Admit: 2024-08-21 | Source: Ambulatory Visit

## 2024-09-05 ENCOUNTER — Ambulatory Visit
Admission: RE | Admit: 2024-09-05 | Discharge: 2024-09-05 | Disposition: A | Discharge status: Home / Self Care | Source: Ambulatory Visit | Attending: Nurse Practitioner | Admitting: Nurse Practitioner

## 2024-09-05 DIAGNOSIS — N63 Unspecified lump in unspecified breast: Secondary | ICD-10-CM | POA: Diagnosis present
